# Patient Record
Sex: Female | Born: 1950
Health system: Southern US, Community
[De-identification: ages and names within clinical notes are randomized; demographics above are authoritative.]

## PROBLEM LIST (undated history)

## (undated) DIAGNOSIS — Z87898 Personal history of other specified conditions: Secondary | ICD-10-CM

## (undated) DIAGNOSIS — K449 Diaphragmatic hernia without obstruction or gangrene: Secondary | ICD-10-CM

## (undated) DIAGNOSIS — K219 Gastro-esophageal reflux disease without esophagitis: Secondary | ICD-10-CM

## (undated) DIAGNOSIS — D473 Essential (hemorrhagic) thrombocythemia: Secondary | ICD-10-CM

## (undated) DIAGNOSIS — E119 Type 2 diabetes mellitus without complications: Secondary | ICD-10-CM

## (undated) DIAGNOSIS — D649 Anemia, unspecified: Secondary | ICD-10-CM

## (undated) DIAGNOSIS — E611 Iron deficiency: Secondary | ICD-10-CM

## (undated) DIAGNOSIS — M1990 Unspecified osteoarthritis, unspecified site: Secondary | ICD-10-CM

## (undated) DIAGNOSIS — I471 Supraventricular tachycardia, unspecified: Secondary | ICD-10-CM

## (undated) DIAGNOSIS — E785 Hyperlipidemia, unspecified: Secondary | ICD-10-CM

## (undated) HISTORY — DX: Supraventricular tachycardia, unspecified: I47.10

## (undated) HISTORY — DX: Type 2 diabetes mellitus without complications: E11.9

## (undated) HISTORY — DX: Personal history of other specified conditions: Z87.898

## (undated) HISTORY — PX: COLONOSCOPY: SHX174

## (undated) HISTORY — DX: Diaphragmatic hernia without obstruction or gangrene: K44.9

## (undated) HISTORY — DX: Hyperlipidemia, unspecified: E78.5

## (undated) HISTORY — DX: Essential (hemorrhagic) thrombocythemia: D47.3

## (undated) HISTORY — DX: Unspecified osteoarthritis, unspecified site: M19.90

## (undated) HISTORY — DX: Anemia, unspecified: D64.9

## (undated) HISTORY — DX: Supraventricular tachycardia: I47.1

## (undated) HISTORY — DX: Gastro-esophageal reflux disease without esophagitis: K21.9

## (undated) HISTORY — DX: Iron deficiency: E61.1

---

## 1997-12-08 ENCOUNTER — Other Ambulatory Visit: Admission: RE | Admit: 1997-12-08 | Discharge: 1997-12-08 | Payer: Self-pay | Admitting: Gynecology

## 1998-03-04 ENCOUNTER — Emergency Department (HOSPITAL_COMMUNITY): Admission: EM | Admit: 1998-03-04 | Discharge: 1998-03-04 | Payer: Self-pay | Admitting: Emergency Medicine

## 1998-05-15 HISTORY — PX: OTHER SURGICAL HISTORY: SHX169

## 1999-01-11 ENCOUNTER — Other Ambulatory Visit: Admission: RE | Admit: 1999-01-11 | Discharge: 1999-01-11 | Payer: Self-pay | Admitting: Gynecology

## 2000-01-29 ENCOUNTER — Emergency Department (HOSPITAL_COMMUNITY): Admission: EM | Admit: 2000-01-29 | Discharge: 2000-01-29 | Payer: Self-pay | Admitting: Emergency Medicine

## 2000-06-20 ENCOUNTER — Ambulatory Visit (HOSPITAL_COMMUNITY): Admission: RE | Admit: 2000-06-20 | Discharge: 2000-06-21 | Payer: Self-pay | Admitting: Internal Medicine

## 2000-07-05 ENCOUNTER — Other Ambulatory Visit: Admission: RE | Admit: 2000-07-05 | Discharge: 2000-07-05 | Payer: Self-pay | Admitting: Gynecology

## 2001-08-26 ENCOUNTER — Other Ambulatory Visit: Admission: RE | Admit: 2001-08-26 | Discharge: 2001-08-26 | Payer: Self-pay | Admitting: Gynecology

## 2002-08-25 ENCOUNTER — Other Ambulatory Visit: Admission: RE | Admit: 2002-08-25 | Discharge: 2002-08-25 | Payer: Self-pay | Admitting: Gynecology

## 2003-09-07 ENCOUNTER — Other Ambulatory Visit: Admission: RE | Admit: 2003-09-07 | Discharge: 2003-09-07 | Payer: Self-pay | Admitting: Gynecology

## 2004-02-08 ENCOUNTER — Ambulatory Visit (HOSPITAL_COMMUNITY): Admission: RE | Admit: 2004-02-08 | Discharge: 2004-02-08 | Payer: Self-pay | Admitting: Gastroenterology

## 2004-05-15 LAB — HM COLONOSCOPY: HM Colonoscopy: NORMAL

## 2004-09-08 ENCOUNTER — Other Ambulatory Visit: Admission: RE | Admit: 2004-09-08 | Discharge: 2004-09-08 | Payer: Self-pay | Admitting: Gynecology

## 2009-10-04 LAB — HM MAMMOGRAPHY

## 2009-10-04 LAB — CONVERTED CEMR LAB: Pap Smear: NEGATIVE

## 2009-10-29 ENCOUNTER — Ambulatory Visit: Payer: Self-pay | Admitting: Internal Medicine

## 2009-10-29 DIAGNOSIS — J309 Allergic rhinitis, unspecified: Secondary | ICD-10-CM | POA: Insufficient documentation

## 2009-10-29 DIAGNOSIS — I471 Supraventricular tachycardia: Secondary | ICD-10-CM

## 2009-10-29 DIAGNOSIS — K219 Gastro-esophageal reflux disease without esophagitis: Secondary | ICD-10-CM

## 2009-10-29 DIAGNOSIS — E785 Hyperlipidemia, unspecified: Secondary | ICD-10-CM

## 2009-11-01 LAB — CONVERTED CEMR LAB
BUN: 9 mg/dL (ref 6–23)
Basophils Relative: 0.4 % (ref 0.0–3.0)
Bilirubin, Direct: 0.1 mg/dL (ref 0.0–0.3)
Chloride: 102 meq/L (ref 96–112)
Cholesterol: 155 mg/dL (ref 0–200)
Eosinophils Relative: 0.4 % (ref 0.0–5.0)
GFR calc non Af Amer: 112.68 mL/min (ref >60)
HCT: 34.1 % — ABNORMAL LOW (ref 36.0–46.0)
HDL: 61.9 mg/dL (ref >39.00)
Hemoglobin: 11.8 g/dL — ABNORMAL LOW (ref 12.0–15.0)
LDL Cholesterol: 81 mg/dL (ref 0–99)
Lymphocytes Relative: 25.2 % (ref 12.0–46.0)
Lymphs Abs: 1.8 10*3/uL (ref 0.7–4.0)
Microalb, Ur: 0.5 mg/dL (ref 0.0–1.9)
Monocytes Relative: 5.2 % (ref 3.0–12.0)
Neutro Abs: 5 10*3/uL (ref 1.4–7.7)
Phosphorus: 4.6 mg/dL (ref 2.3–4.6)
Potassium: 4.8 meq/L (ref 3.5–5.1)
RBC: 3.67 M/uL — ABNORMAL LOW (ref 3.87–5.11)
RDW: 13.8 % (ref 11.5–14.6)
Sodium: 140 meq/L (ref 135–145)
Total Protein: 7.6 g/dL (ref 6.0–8.3)
VLDL: 12.2 mg/dL (ref 0.0–40.0)
WBC: 7.2 10*3/uL (ref 4.5–10.5)

## 2010-02-01 ENCOUNTER — Ambulatory Visit: Payer: Self-pay | Admitting: Internal Medicine

## 2010-06-13 ENCOUNTER — Encounter: Payer: Self-pay | Admitting: Internal Medicine

## 2010-06-15 ENCOUNTER — Encounter: Payer: Self-pay | Admitting: Internal Medicine

## 2010-06-16 NOTE — Assessment & Plan Note (Signed)
Summary: FOLLOW UP / LFW   Vital Signs:  Patient profile:   60 year old female Weight:      176 pounds Temp:     98.5 degrees F oral Pulse rate:   80 / minute Pulse rhythm:   regular BP sitting:   120 / 68  (left arm) Cuff size:   regular  Vitals Entered By: Mervin Hack CMA Duncan Dull) (February 01, 2010 9:14 AM) CC: 3 month follow-up   History of Present Illness: Doing okay  still takes the prilosec uses one a day---still occ gets breakthrough Doesn't recall any breakthrough on nexium may need two times a day   Still not checking sugars much Plans to restart No hypoglycemic reactions No problems with the actos  No palpitations or rapid heart rate  No problems with chol meds no myalgias recently  Allergies: No Known Drug Allergies  Past History:  Past medical, surgical, family and social histories (including risk factors) reviewed for relevance to current acute and chronic problems.  Past Medical History: Reviewed history from 10/29/2009 and no changes required. Allergic rhinitis Diabetes mellitus, type II GERD Hyperlipidemia SVT  GYN--Dr Lodema Hong  Past Surgical History: Reviewed history from 10/29/2009 and no changes required. SVT ablation    ~2000     Dr Graciela Husbands  Family History: Reviewed history from 10/29/2009 and no changes required. Dad died of MI @61  Mom fairly healthy. Has macular degeneration 2 sisters--- 1 had uterine cancer, other with DM 1 brother Other distant CAD on dad's side No colon or breast cancer  Social History: Reviewed history from 10/29/2009 and no changes required. 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   Neck:  supple, no masses, no thyromegaly, no carotid bruits, and no cervical lymphadenopathy.   Lungs:  normal respiratory effort, no intercostal retractions, no accessory muscle use, and normal breath sounds.   Heart:  normal rate, regular rhythm, no murmur, and no gallop.   Pulses:  2+ in feet Extremities:  no edema Skin:  no suspicious lesions and no ulcerations.   Psych:  normally interactive, good eye contact, not depressed appearing, and slightly anxious.    Diabetes Management Exam:    Foot Exam (with socks and/or shoes not present):       Sensory-Pinprick/Light touch:          Left medial foot (L-4): normal          Left dorsal foot (L-5): normal          Left lateral foot (S-1): normal          Right medial foot (L-4): normal          Right dorsal foot (L-5): normal          Right lateral foot (S-1): normal       Inspection:          Left foot: normal          Right foot: normal       Nails:          Left foot: normal          Right foot: normal   Impression & Recommendations:  Problem #  1:  DIABETES MELLITUS, TYPE II (ICD-250.00) Assessment Unchanged control is okay will continue current meds for now  Her updated medication list for this problem includes:    Metformin Hcl 1000 Mg Tabs (Metformin hcl) .Marland Kitchen... Take 1 by mouth two times a day    Actos 45 Mg Tabs (Pioglitazone hcl) .Marland Kitchen... Take 1 by mouth once daily    Aspirin 81 Mg Tabs (Aspirin) ..... Once daily  Labs Reviewed: Creat: 0.6 (10/29/2009)     Last Eye Exam: normal (03/15/2009) Reviewed HgBA1c results: 7.3 (10/29/2009)  Problem # 2:  HYPERLIPIDEMIA (ICD-272.4) Assessment: Unchanged at goal discussed primary prevention but decided to continue  Her updated medication list for this problem includes:    Simvastatin 10 Mg Tabs (Simvastatin) .Marland Kitchen... Take 1 by mouth once daily  Labs Reviewed: SGOT: 22 (10/29/2009)   SGPT: 18 (10/29/2009)   HDL:61.90 (10/29/2009)  LDL:81 (10/29/2009)  Chol:155  (10/29/2009)  Trig:61.0 (10/29/2009)  Problem # 3:  GERD (ICD-530.81) Assessment: Comment Only may need two times a day prilosec  The following medications were removed from the medication list:    Nexium 40 Mg Cpdr (Esomeprazole magnesium) .Marland Kitchen... Take 1 by mouth once daily Her updated medication list for this problem includes:    Prilosec Otc 20 Mg Tbec (Omeprazole magnesium) .Marland Kitchen... Take 1-2 by mouth once daily  Problem # 4:  SUPRAVENTRICULAR TACHYCARDIA (ICD-427.89) Assessment: Unchanged no recurrence  Her updated medication list for this problem includes:    Aspirin 81 Mg Tabs (Aspirin) ..... Once daily  Complete Medication List: 1)  Metformin Hcl 1000 Mg Tabs (Metformin hcl) .... Take 1 by mouth two times a day 2)  Actos 45 Mg Tabs (Pioglitazone hcl) .... Take 1 by mouth once daily 3)  Simvastatin 10 Mg Tabs (Simvastatin) .... Take 1 by mouth once daily 4)  Prilosec Otc 20 Mg Tbec (Omeprazole magnesium) .... Take 1-2 by mouth once daily 5)  Womens Multivitamin Plus Tabs (Multiple vitamins-minerals) .... Once daily 6)  Vitamin C 500 Mg Tabs (Ascorbic acid) .... Once daily 7)  Fish Oil 1000 Mg Caps (Omega-3 fatty acids) .... Once daily 8)  Calcium-vitamin D 250-125 Mg-unit Tabs (Calcium carbonate-vitamin d) .... Once daily 9)  Aspirin 81 Mg Tabs (Aspirin) .... Once daily  Other Orders: Tdap => 68yrs IM [phone]) Admin 1st Vaccine (60454) Admin 1st Vaccine [facility]) [phone]) Flu Vaccine 20yrs + [phone]) Admin of Any Addtl Vaccine (95621) Admin of Any Addtl Vaccine (State) (30865H) Pneumococcal Vaccine (84696) Admin of Any Addtl Vaccine (29528) Admin of Any Addtl Vaccine (State) [phone])  Patient Instructions: 1)  Please schedule a follow-up appointment in 6 months for physical Prescriptions: PRILOSEC OTC 20 MG TBEC (OMEPRAZOLE MAGNESIUM) take 1-2 by mouth once daily  #60 x 12   Entered by:   Mervin Hack CMA (AAMA)   Authorized by:   Cindee Salt MD   Signed by:    Mervin Hack CMA (AAMA) on 02/01/2010   Method used:   Print then Give to Patient   RxID:   [id number]   Current Allergies (reviewed today): No known allergies    Tetanus/Td Vaccine    Vaccine Type: Tdap    Site: left deltoid    Mfr: GlaxoSmithKline    Dose: 0.5 ml    Route: IM    Given by: Mervin Hack CMA (AAMA)    Exp. Date: 02/03/2012    Lot #: [id number]    VIS given: 04/01/08 version given February 01, 2010.  Pneumovax Vaccine  Vaccine Type: Pneumovax    Site: right deltoid    Mfr: Merck    Dose: 0.5 ml    Route: IM    Given by: Mervin Hack CMA (AAMA)    Exp. Date: 07/28/2011    Lot #: [id number]    VIS given: 04/19/09 version given February 01, 2010.  Influenza Vaccine    Vaccine Type: Fluvax 3+    Site: left deltoid    Mfr: GlaxoSmithKline    Dose: 0.5 ml    Route: IM    Given by: Mervin Hack CMA (AAMA)    Exp. Date: 11/12/2010    Lot #: [id number]    VIS given: 12/07/09 version given February 01, 2010.  Flu Vaccine Consent Questions    Do you have a history of severe allergic reactions to this vaccine? no    Any prior history of allergic reactions to egg and/or gelatin? no    Do you have a sensitivity to the preservative Thimersol? no    Do you have a past history of Guillan-Barre Syndrome? no    Do you currently have an acute febrile illness? no    Have you ever had a severe reaction to latex? no    Vaccine information given and explained to patient? yes    Are you currently pregnant? no

## 2010-06-16 NOTE — Assessment & Plan Note (Signed)
Summary: NEW PT TO EST/CLE   Vital Signs:  Patient profile:   60 year old female Height:      64 inches Weight:      171 pounds BMI:     29.46 Temp:     98.7 degrees F oral Pulse rate:   88 / minute Pulse rhythm:   regular BP sitting:   148 / 80  (left arm) Cuff size:   regular  Vitals Entered By: Mervin Hack CMA Duncan Dull) (October 29, 2009 11:38 AM) CC: new patient to establish care   History of Present Illness: Here to establish I recently started seeing her husband  GERD for some time on prilosec in past Believes she failed zantac in the past then switched to nexium and has done well on that  Diabetes for about 10 years doesn't really check at home metformin--then actos added never on other meds no low sugar reactions  Put on statin chol not that high but due to DM  Developed palpitations had shots in ER then on atenolol apparent SVT had ablation  ~2000 by Dr Harless Nakayama cats if heavy indoor exposure as well  Preventive Screening-Counseling & Management  Alcohol-Tobacco     Smoking Status: never  Allergies (verified): No Known Drug Allergies  Past History:  Past Medical History: Allergic rhinitis Diabetes mellitus, type II GERD Hyperlipidemia SVT  GYN--Dr Lodema Hong  Past Surgical History: SVT ablation    ~2000     Dr Graciela Husbands  Family History: Dad died of MI @61  Mom fairly healthy. 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 Eyes:  Denies  double vision and vision loss-1 eye. CV:  Denies chest pain or discomfort, difficulty breathing at night, difficulty breathing while lying down, fainting, lightheadness, palpitations, and shortness of breath with exertion. Resp:  Denies cough and shortness of breath. GI:  Complains of indigestion; denies abdominal pain, bloody stools, change in bowel habits, and dark tarry stools; heartburn controlled occ bloating. GU:  Denies dysuria and incontinence; No sexual problems. MS:  Denies joint pain and joint swelling; right 5th finger ---distal digit in flexion from apparent tendon injury. Derm:  Denies lesion(s) and rash. Neuro:  Denies numbness, tingling, and weakness. Psych:  Denies anxiety and depression; work stress. Allergy:  Complains of seasonal allergies and sneezing; uses OTC meds as needed .  Physical Exam  General:  alert and normal appearance.   Neck:  supple, no masses, no thyromegaly, no carotid bruits, and no cervical lymphadenopathy.   Lungs:  normal respiratory effort and normal breath sounds.   Heart:  normal rate, regular rhythm, no murmur, and no gallop.   Abdomen:  soft and non-tender.   Msk:  no joint tenderness and no joint swelling.   Pulses:  1+ in feet Extremities:  no edema Neurologic:  alert & oriented X3, strength normal in all extremities, and gait normal.   Skin:  no suspicious lesions and no ulcerations.   Psych:  normally interactive, good eye contact, not anxious appearing, and not depressed appearing.    Diabetes  Management Exam:    Foot Exam (with socks and/or shoes not present):       Sensory-Pinprick/Light touch:          Left medial foot (L-4): normal          Left dorsal foot (L-5): normal          Left lateral foot (S-1): normal          Right medial foot (L-4): normal          Right dorsal foot (L-5): normal          Right lateral foot (S-1): normal       Sensory-Monofilament:          Left foot: normal          Right foot: normal        Nails:          Left foot: normal          Right foot: normal    Eye Exam:       Eye Exam done elsewhere          Date: 03/15/2009          Results: normal          Done by: Dr Alexia Freestone   Impression & Recommendations:  Problem # 1:  DIABETES MELLITUS, TYPE II (ICD-250.00) Assessment Unchanged  seems to have good control urged her to get machine to check sugars at least once a week consider changing actos to glipizide  Her updated medication list for this problem includes:    Metformin Hcl 1000 Mg Tabs (Metformin hcl) .Marland Kitchen... Take 1 by mouth two times a day    Actos 45 Mg Tabs (Pioglitazone hcl) .Marland Kitchen... Take 1 by mouth once daily    Aspirin 81 Mg Tabs (Aspirin) ..... Once daily  Last Eye Exam: normal (03/15/2009)  Orders: TLB-Microalbumin/Creat Ratio, Urine (82043-MALB) TLB-A1C / Hgb A1C (Glycohemoglobin) (83036-A1C) TLB-Renal Function Panel (80069-RENAL) TLB-CBC Platelet - w/Differential (85025-CBCD) TLB-TSH (Thyroid Stimulating Hormone) (84443-TSH)  Problem # 2:  GERD (ICD-530.81) Assessment: Comment Only doing well she will try omeprazole  Her updated medication list for this problem includes:    Nexium 40 Mg Cpdr (Esomeprazole magnesium) .Marland Kitchen... Take 1 by mouth once daily  Problem # 3:  HYPERLIPIDEMIA (ICD-272.4) Assessment: Comment Only  not clear cut if LDL <<100, consider stopping  Her updated medication list for this problem includes:    Simvastatin 10 Mg Tabs (Simvastatin) .Marland Kitchen... Take 1 by mouth once daily  Orders: TLB-Lipid Panel (80061-LIPID) TLB-Hepatic/Liver Function Pnl (80076-HEPATIC) Venipuncture (04540)  Problem # 4:  ALLERGIC RHINITIS (ICD-477.9) Assessment: Unchanged okay with OTC meds  Complete Medication List: 1)  Metformin Hcl 1000 Mg Tabs (Metformin hcl) .... Take 1 by mouth two times a day 2)  Actos 45 Mg Tabs (Pioglitazone hcl) .... Take 1 by mouth once daily 3)  Simvastatin 10 Mg Tabs (Simvastatin) .... Take 1 by mouth once daily 4)   Nexium 40 Mg Cpdr (Esomeprazole magnesium) .... Take 1 by mouth once daily 5)  Womens Multivitamin Plus Tabs (Multiple vitamins-minerals) .... Once daily 6)  Vitamin C 500 Mg Tabs (Ascorbic acid) .... Once daily 7)  Fish Oil 1000 Mg Caps (Omega-3 fatty acids) .... Once daily 8)  Calcium-vitamin D 250-125 Mg-unit Tabs (Calcium carbonate-vitamin d) .... Once daily 9)  Aspirin 81 Mg Tabs (Aspirin) .... Once daily  Patient Instructions: 1)  Please try omeprazole 20mg  daily for the reflux. (over the counter). 2)  I  can prescribe the nexium if that doesn't work 3)  Please get last 3 years of records from Dr Earl Gala 4)  Please schedule a follow-up appointment in 3 months .   Current Allergies (reviewed today): No known allergies    Colonoscopy  Procedure date:  05/15/2004  Findings:       Results: Normal.   Comments:      Repeat colonoscopy in 10 years.    Mammogram  Procedure date:  10/04/2009  Findings:      No specific mammographic evidence of malignancy.    Pap Smear  Procedure date:  10/04/2009  Findings:      Interpretation/Result:Negative for intraepithelial Lesion or Malignancy.

## 2010-06-21 ENCOUNTER — Telehealth: Payer: Self-pay | Admitting: Internal Medicine

## 2010-06-30 NOTE — Letter (Signed)
Summary: [facility]   Imported By: Kassie Mends 06/22/2010 08:31:02  _____________________________________________________________________  External Attachment:    Type:   Image     Comment:   External Document  Appended Document: [facility]    Clinical Lists Changes  Observations: Added new observation of DIAB EYE EX: No diabetic retinopathy.    (06/14/2010 14:26)       Diabetic Eye Exam  Procedure date:  06/14/2010  Findings:      No diabetic retinopathy.

## 2010-06-30 NOTE — Progress Notes (Signed)
Summary: refill request for metformin  Phone Note Refill Request Message from:  Patient  Refills Requested: Medication #1:  METFORMIN HCL 1000 MG TABS take 1 by mouth two times a day Pt is requesting a 90 day written script to send to mail order pharmacy.  She is asking that this be mailed to her home address.  Initial call taken by: Lowella Petties CMA, AAMA,  June 21, 2010 9:19 AM  Follow-up for Phone Call        Rx written Make sure she knows this is $10 locally without insurance Follow-up by: Cindee Salt MD,  June 21, 2010 12:21 PM  Additional Follow-up for Phone Call Additional follow up Details #1::        left message on machine that rx was mailed to home address. Additional Follow-up by: Mervin Hack CMA Duncan Dull),  June 21, 2010 12:58 PM    Prescriptions: METFORMIN HCL 1000 MG TABS (METFORMIN HCL) take 1 by mouth two times a day  #180 x 3   Entered and Authorized by:   Cindee Salt MD   Signed by:   Cindee Salt MD on 06/21/2010   Method used:   Print then Give to Patient   RxID:   [id number]

## 2010-08-01 ENCOUNTER — Encounter: Payer: Self-pay | Admitting: Internal Medicine

## 2010-08-01 ENCOUNTER — Encounter (INDEPENDENT_AMBULATORY_CARE_PROVIDER_SITE_OTHER): Payer: Managed Care, Other (non HMO) | Admitting: Internal Medicine

## 2010-08-01 ENCOUNTER — Other Ambulatory Visit: Payer: Self-pay | Admitting: Internal Medicine

## 2010-08-01 DIAGNOSIS — E785 Hyperlipidemia, unspecified: Secondary | ICD-10-CM

## 2010-08-01 DIAGNOSIS — Z0000 Encounter for general adult medical examination without abnormal findings: Secondary | ICD-10-CM

## 2010-08-01 DIAGNOSIS — E119 Type 2 diabetes mellitus without complications: Secondary | ICD-10-CM

## 2010-08-01 LAB — CBC WITH DIFFERENTIAL/PLATELET
Basophils Absolute: 0 10*3/uL (ref 0.0–0.1)
Basophils Relative: 0.6 % (ref 0.0–3.0)
Eosinophils Absolute: 0.1 10*3/uL (ref 0.0–0.7)
HCT: 34.3 % — ABNORMAL LOW (ref 36.0–46.0)
Hemoglobin: 11.5 g/dL — ABNORMAL LOW (ref 12.0–15.0)
Lymphs Abs: 1.9 10*3/uL (ref 0.7–4.0)
MCHC: 33.6 g/dL (ref 30.0–36.0)
Monocytes Relative: 6.9 % (ref 3.0–12.0)
Neutro Abs: 3.4 10*3/uL (ref 1.4–7.7)
RBC: 3.66 Mil/uL — ABNORMAL LOW (ref 3.87–5.11)
RDW: 14.4 % (ref 11.5–14.6)

## 2010-08-01 LAB — RENAL FUNCTION PANEL
Albumin: 4.3 g/dL (ref 3.5–5.2)
BUN: 10 mg/dL (ref 6–23)
CO2: 29 mEq/L (ref 19–32)
Chloride: 101 mEq/L (ref 96–112)
Creatinine, Ser: 0.6 mg/dL (ref 0.4–1.2)
GFR: 100.32 mL/min (ref >60.00)

## 2010-08-01 LAB — LIPID PANEL
HDL: 67 mg/dL (ref >39.00)
Total CHOL/HDL Ratio: 2
VLDL: 7.8 mg/dL (ref 0.0–40.0)

## 2010-08-01 LAB — HEPATIC FUNCTION PANEL
ALT: 17 U/L (ref 0–35)
Bilirubin, Direct: 0.1 mg/dL (ref 0.0–0.3)
Total Bilirubin: 0.3 mg/dL (ref 0.3–1.2)

## 2010-08-11 NOTE — Assessment & Plan Note (Signed)
Summary: CPX/DLO   Vital Signs:  Patient profile:   60 year old female Weight:      175 pounds BMI:     30.15 Temp:     98.4 degrees F oral Pulse rate:   80 / minute Pulse rhythm:   regular BP sitting:   128 / 70  (left arm) Cuff size:   regular  Vitals Entered By: Mervin Hack CMA Duncan Dull) (August 01, 2010 11:53 AM) CC: adult physical   History of Present Illness: Feeling well hasn't lost weight--up and down  Has been doing okay on omeprazole generic Occ uses second if eating late, etc This is doing well  keeps up with gyn  Allergies: No Known Drug Allergies  Past History:  Past medical, surgical, family and social histories (including risk factors) reviewed for relevance to current acute and chronic problems.  Past Medical History: Reviewed history from 10/29/2009 and no changes required. Allergic rhinitis Diabetes mellitus, type II GERD Hyperlipidemia SVT  GYN--Dr Lodema Hong  Past Surgical History: Reviewed history from 10/29/2009 and no changes required. SVT ablation    ~2000     Dr Graciela Husbands  Family History: Reviewed history from 10/29/2009 and no changes required. Dad died of MI @61  Mom fairly healthy. Has macular degeneration 2 sisters--- 1 had uterine cancer, other with DM 1 brother Other distant CAD on dad's side No colon or breast cancer  Social History: Reviewed history from 10/29/2009 and no changes required. 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 Eyes:  Denies double vision and vision loss-1 eye. ENT:  Denies decreased hearing and ringing in ears; teeth fine---regular with dentist. CV:  Denies chest pain or discomfort, difficulty breathing at night, difficulty breathing while lying down, fainting, lightheadness,  palpitations, and shortness of breath with exertion. Resp:  Denies cough and shortness of breath; occ AM cough due to drainage from allergies. GI:  Complains of indigestion; denies abdominal pain, bloody stools, change in bowel habits, dark tarry stools, nausea, and vomiting; heartburn mostly controlled. GU:  Denies dysuria and incontinence; no sexual problems. MS:  Denies joint pain and joint swelling. Derm:  Denies lesion(s) and rash. Neuro:  Complains of numbness; denies headaches, tingling, and weakness; occ awakens with numb hands---resolves quickly. Psych:  Denies anxiety and depression. Heme:  Denies abnormal bruising and enlarge lymph nodes. Allergy:  Complains of seasonal allergies and sneezing; uses OTC meds.  Physical Exam  General:  alert and normal appearance.   Eyes:  pupils equal, pupils round, pupils reactive to light, and no optic disk abnormalities.   Ears:  R ear normal and L ear normal.   Mouth:  no erythema, no exudates, and no lesions.   Neck:  supple, no masses, no thyromegaly, no carotid bruits, and no cervical lymphadenopathy.   Lungs:  normal respiratory effort, no intercostal retractions, no accessory muscle use, and normal breath sounds.   Heart:  normal rate, regular rhythm, no murmur, and no gallop.   Abdomen:  soft and non-tender.   Msk:  no joint tenderness and no joint swelling.   Pulses:  1+ in feet Extremities:  no edema Neurologic:  alert & oriented X3, strength normal in all extremities, and gait normal.   Skin:  no rashes, no suspicious lesions, and no ulcerations.   Axillary Nodes:  No palpable lymphadenopathy Psych:  normally interactive, good eye contact, not anxious appearing, and not depressed appearing.    Diabetes Management Exam:    Foot Exam (with socks and/or shoes not present):       Sensory-Pinprick/Light touch:          Left medial foot (L-4): normal          Left dorsal foot (L-5): normal          Left lateral foot (S-1): normal           Right medial foot (L-4): normal          Right dorsal foot (L-5): normal          Right lateral foot (S-1): normal       Inspection:          Left foot: normal          Right foot: normal       Nails:          Left foot: normal          Right foot: normal   Impression & Recommendations:  Problem # 1:  PREVENTIVE HEALTH CARE (ICD-V70.0) Assessment Comment Only up to date on colonoscopy still sees Gyn yearly  Problem # 2:  DIABETES MELLITUS, TYPE II (ICD-250.00) Assessment: Unchanged  really wants to get off actos and I endorse this if still under 7.5%, will stop Consider glipizide if needs another med---if >7.5%, will just substitute it  The following medications were removed from the medication list:    Actos 45 Mg Tabs (Pioglitazone hcl) .Marland Kitchen... Take 1 by mouth once daily Her updated medication list for this problem includes:    Metformin Hcl 1000 Mg Tabs (Metformin hcl) .Marland Kitchen... Take 1 by mouth two times a day    Aspirin 81 Mg Tabs (Aspirin) ..... Once daily  Labs Reviewed: Creat: 0.6 (10/29/2009)     Last Eye Exam: No diabetic retinopathy.    (06/14/2010) Reviewed HgBA1c results: 7.3 (10/29/2009)  Orders: TLB-A1C / Hgb A1C (Glycohemoglobin) (83036-A1C) TLB-TSH (Thyroid Stimulating Hormone) (84443-TSH) TLB-CBC Platelet - w/Differential (85025-CBCD) TLB-Renal Function Panel (80069-RENAL)  Problem # 3:  GERD (ICD-530.81) Assessment: Unchanged okay on the med  Her updated medication list for this problem includes:    Prilosec Otc 20 Mg Tbec (Omeprazole magnesium) .Marland Kitchen... Take 1-2 by mouth once daily  Problem # 4:  HYPERLIPIDEMIA (ICD-272.4) Assessment: Unchanged  will recheck labs  no problems with the med  Her updated medication list for this problem includes:    Simvastatin 10 Mg Tabs (Simvastatin) .Marland Kitchen... Take 1 by mouth once daily  Labs Reviewed: SGOT: 22 (10/29/2009)   SGPT: 18 (10/29/2009)   HDL:61.90 (10/29/2009)  LDL:81 (10/29/2009)  Chol:155  (10/29/2009)  Trig:61.0 (10/29/2009)  Orders: TLB-Lipid Panel (80061-LIPID) TLB-Hepatic/Liver Function Pnl (80076-HEPATIC) Venipuncture (29562)  Complete Medication List: 1)  Metformin Hcl 1000 Mg Tabs (Metformin hcl) .... Take 1 by mouth two times a day 2)  Simvastatin 10 Mg Tabs (Simvastatin) .... Take 1 by mouth once daily 3)  Prilosec Otc 20 Mg Tbec (Omeprazole magnesium) .... Take 1-2 by mouth once daily 4)  Womens Multivitamin Plus Tabs (Multiple vitamins-minerals) .... Once daily 5)  Vitamin C 500 Mg Tabs (Ascorbic acid) .... Once daily 6)  Fish Oil 1000 Mg Caps (Omega-3 fatty acids) .... Once daily 7)  Calcium-vitamin D 250-125 Mg-unit Tabs (Calcium carbonate-vitamin d) .... Once daily 8)  Aspirin 81 Mg Tabs (Aspirin) .... Once daily  Patient Instructions: 1)  Please stop the actos 2)  We will call with the lab results to let you know if you have to start another med 3)  Please schedule a follow-up appointment in 6 months .  Prescriptions: SIMVASTATIN 10 MG TABS (SIMVASTATIN) take 1 by mouth once daily  #90 x 3   Entered and Authorized by:   Cindee Salt MD   Signed by:   Cindee Salt MD on 08/01/2010   Method used:   Print then Give to Patient   RxID:   [id number]    Orders Added: 1)  TLB-A1C / Hgb A1C (Glycohemoglobin) [83036-A1C] 2)  TLB-TSH (Thyroid Stimulating Hormone) [84443-TSH] 3)  TLB-CBC Platelet - w/Differential [85025-CBCD] 4)  TLB-Renal Function Panel [80069-RENAL] 5)  TLB-Lipid Panel [80061-LIPID] 6)  TLB-Hepatic/Liver Function Pnl [80076-HEPATIC] 7)  Venipuncture [36415] 8)  Est. Patient 40-64 years [14782]    Current Allergies (reviewed today): No known allergies

## 2010-09-30 ENCOUNTER — Telehealth: Payer: Self-pay | Admitting: *Deleted

## 2010-09-30 MED ORDER — GLIPIZIDE 5 MG PO TABS: 5.0000 mg | ORAL_TABLET | Freq: Every morning | ORAL | Status: DC

## 2010-09-30 NOTE — Telephone Encounter (Signed)
Pt states she was taken off actos about a month ago and now her blood sugars are running around 150. She says she needs something to go along with the metformin that she is on now, and Dr. Alphonsus Sias told her there is another medicine that she can take.  She would like a written 90 day script mailed to her home address.

## 2010-09-30 NOTE — Telephone Encounter (Signed)
Left message on machine at home advising patient as instructed.  Rx mailed to home address.

## 2010-09-30 NOTE — Discharge Summary (Signed)
[place]  Patient:    Savannah Evans, Savannah Evans                     MRN: [id number] Adm. Date:  [id number] Disc. Date: [id number] Attending:  Nathen May Dictator:   Chinita Pester, CRNP CC:         Winn Jock. Earl Gala, M.D., [address], [place], [place] [address]             Nathen May, M.D., [facility]   Discharge Summary  PRIMARY DIAGNOSIS:  AV nodal reentrant tachycardia.  SECONDARY DIAGNOSIS:  Increased recently new onset diabetes type 2.  HISTORY OF PRESENT ILLNESS:  This is a 60 year old woman with a 25-year history of recent abrupt onset of tachy palpitations initially presented during pregnancy of her first child. Thereafter, she had episodes every three years for 15 years. Over the last four to five years, however, she has increase in frequency and now had three episodes in the last few months, a number of which required hospitalization and medication to convert her rhythm. Initially treated with atenolol. She continued to have spells on 25 mg. She has not had any spells since she increased the dose to 50 mg a day.  Symptoms are notable for significant presyncope, particularly worse at the beginning. There is also chest pain and shortness of breath that are frog and diuretic negative.  She has been unable to terminate these episodes with Valsalva maneuver.  The patient was admitted for an AV nodal ablation. She underwent inducible AV nodal reentrant tachycardia and had successful slow pathway modification eliminating antegrade slow pathway function and substrate for AV nodal reentrant tachycardia.  The patient tolerated the procedure well. She had no complications post procedure and was discharged to home the following day.  She was discharged on the following medications.  DISCHARGE MEDICATIONS: 1. Enteric coated aspirin 325 mg daily for six weeks. 2. Antibiotics prior to any dental or gynecologic work for the next  three    months. 3. Calcium as before. 4. Multivitamins as before. 5. Atenolol 25 mg continued for five days and then discontinue.  ACTIVITY:  No heavy lifting or strenuous activity for four days.  No driving for two days.  DIET:  Low fat, low cholesterol, no concentrated sweet diet.  WOUND CARE:  She was instructed to keep her wounds clean and dry.  She may shower.  She was to call if there is any drainage, lump, or if she developed a fever.  FOLLOW-UP:  She is to follow up with Nathen May, M.D., [facility], on Monday, July 09, 2000, at 3:45 p.m. DD:  06/21/00 TD:  06/22/00 Job: [id number]

## 2010-09-30 NOTE — Op Note (Signed)
NAME:  Savannah Evans, CHALUPA NO.:  [id number]   MEDICAL RECORD NO.:  [id number]          PATIENT TYPE:  AMB   LOCATION:  ENDO                         FACILITY:  [facility]   PHYSICIAN:  Danise Edge, M.D.   DATE OF BIRTH:  08/09/1950   DATE OF PROCEDURE:  02/08/2004  DATE OF DISCHARGE:                                 OPERATIVE REPORT   PROCEDURE:  Screening colonoscopy.   INDICATIONS FOR PROCEDURE:  Ms. Blanchie Zeleznik is a 60 year old female born  December 14, 1950.  Ms. Totino is scheduled to undergo her first screening  colonoscopy with polypectomy to prevent colon cancer.  She denies a personal  or family history of colon cancer.   ENDOSCOPIST:  Danise Edge, M.D.   PREMEDICATION:  Versed 9 mg, Demerol 60 mg.   PROCEDURE:  After obtaining informed consent, Ms. Vanzandt was placed in the  left lateral decubitus position.  I administered intravenous Demerol and  intravenous Versed to achieve conscious sedation for the procedure.  The  patient's blood pressure, oxygen saturation, and cardiac rhythm were  monitored throughout the procedure and documented in the medical record.   Anal inspection and digital rectal exam was normal.  The Olympus adjustable  pediatric colonoscope was introduced into the rectum and advanced to the  cecum.  A normal-appearing ileocecal valve was intubated, and the distal  ileum inspected.  Colonic preparation for the exam today was excellent.   RECTUM:  Normal.   SIGMOID COLON/DESCENDING COLON:  Normal.   SPLENIC FLEXURE:  Normal.   TRANSVERSE COLON:  Normal.   HEPATIC FLEXURE:  Normal.   ASCENDING COLON:  Normal.   CECUM AND ILEOCECAL VALVE:  Normal.   DISTAL ILEUM:  Normal.   ASSESSMENT:  Normal screening proctocolonoscopy of the cecum.  No endoscopic  evidence for the presence of colorectal neoplasia.      MJ/MEDQ  D:  02/08/2004  T:  02/08/2004  Job:  [id number]   cc:   Theressa Millard, M.D.  [address] [place]  [place] [address]  Fax: [phone]

## 2010-09-30 NOTE — Telephone Encounter (Signed)
Please send Rx Caution her to be careful about possible low sugar reactions

## 2010-09-30 NOTE — Procedures (Signed)
[place]  Patient:    Savannah Evans, Savannah Evans                       MRN: [id number] Proc. Date: 06/20/00 Attending:  Nathen May, M.D., [facility] CC:         Electrophysiology Laboratory  Winn Jock. Earl Gala, M.D.   Procedure Report  PREOPERATIVE DIAGNOSIS:  Supraventricular tachycardia.  POSTOPERATIVE DIAGNOSIS:  AV nodal re-entry tachycardia.  PROCEDURE:  Electrophysiological study with radiofrequency catheter ablation and arrhythmia mapping.  DESCRIPTION OF PROCEDURE:  Following the obtaining of informed consent, the patient was brought to the electrophysiology laboratory and placed on the fluoroscopic table in supine position.  After routine prep and drape, cardiac catheterization was performed with local anesthesia and conscious sedation. Noninvasive blood pressure monitoring and transcutaneous oxygen saturation monitoring were performed continuously throughout the procedure.  Following insertion of the catheters, the stimulation protocol included incremental atrial pacing, incremental ventricular pacing, single atrial extrastimuli at a paced cycle length of 500 msec, double atrial extrastimulation at a cycle length of 400 msec, single ventricular extrastimuli at a paced cycle length of 600 msec.  Surface leads I, aVF, and V1 were monitored continuously throughout the procedure.  At the end of the procedure, the catheter was removed, hemostasis was obtained, and the patient was transferred to the holding area in stable condition.  CATHETERS:  A 5 French quadripolar catheter was inserted via the left femoral vein to the AV junction, and a 5 French quadripolar catheter was inserted via the left femoral junction to the high right atrium.  A 5 French quadripolar catheter was inserted from the left femoral vein to the right ventricular apex.  A 6 French octapolar catheter was inserted via the right femoral vein to the coronary sinus.  A 7  French 4 mm deflectable-tip ablation catheter was inserted via the right femoral vein using a Swartz (SR-0) sheath to mapping sites in the posterior septal space.  RESULTS:  SURFACE ELECTROCARDIOGRAM:            Initial             Final Rhythm:                                sinus              sinus Cycle length:                         596 msec            560 msec PR interval:                           99 msec             88 msec QRS duration:                          65 msec             69 msec QT interval:                          325 msec            323 msec P-wave duration:  68 msec             58 msec Bundle branch block:                   absent              absent Pre-excitation:                        absent              absent  AV NODAL FUNCTION: AH interval:                           52 msec             55 msec VA Wenckebach:  Less than or equal to 300 msec.  AV nodal conduction characteristics could not be fully characterized prior to ablation.  The patient had discontinuity in induction of tachycardia, but the patients tachycardia was very rapid (see below) and was difficult to terminate.  In addition, discontinuity was seen only with double atrial extrastimuli at a paced cycle length of 400:280.  Following the ablation, the patient had no antegrade slow pathway conduction, with the ERP of the fast pathway at a paced cycle length of 400 msec of 270 msec.  Retrograde conduction was continuous pre- and post-ablation.  HIS-PURKINJE SYSTEM FUNCTION:         Initial             Final HV interval:                          34 msec             32 msec His bundle duration:                   8 msec             12 msec  ACCESSORY PATHWAY:  No evidence of accessory pathway was evident.  ARRHYTHMIAS INDUCED:  AV node re-entry tachycardia was reproducibly inducible, initially with atrial pacing of 270 msec as well as with double atrial extrastimuli.  The  tachycardia was terminated with atrial pacing at 220 msec. In a typical episode of tachycardia, the cycle length was 270 msec with an HA interval of 25 msec and an AH interval of 231 msec, a VA time of 7 msec. Earliest atrial activation was in the His bundle electrogram.  Right bundle branch block aberration was unassociated with a change in the [place] time. Initiation of tachycardia was particularly dependent on AH prolongation.  FLUOROSCOPY TIME:  A total of nine minutes of fluoroscopy time was utilized at 15 frames per second.  RADIOFREQUENCY ENERGY:  A total of 34 seconds of energy was applied in two separate applications to sites in the posterior septal space at the level of the coronary sinus, where an AH ratio of 1:4 was identified.  With the second application, a junctional tachycardia ensued and after a total application time of 19 seconds, the application was terminated because of displacement of the catheter.  Hereafter, no evidence of antegrade slow pathway conduction could be identified, and no further RF applications were given.  IMPRESSION: 1. Sinus tachycardia. 2. Normal atrial function. 3. Dual antegrade AV nodal physiology with inducible AV nodal (typical)    slow-fast AV nodal re-entry tachycardia.  Slow pathway modification using  RF energy with _____ eliminated conduction over the slow pathway, albeit    with an abbreviated application of RF energy.  This may have some    implications to the likelihood of recurrence. 4. Normal His-Purkinje function. 5. No accessory pathway. 6. Normal ventricular response to programmed stimulation as described above.  SUMMARY:  In conclusion, the results of electrophysiological testing supported the electrocardiographic suggestion of AV nodal re-entry tachycardia but were discordant with the patients clinical symptoms.  Radiofrequency modification of the slow pathway successfully eliminated the substrate for the  patients tachyarrhythmia.  RECOMMENDATIONS:  1. Observe her overnight. 2. Wean atenolol. 3. Endocarditis prophylaxis for three months. 4. An aspirin daily for six weeks. 5. Anticipate discharge in the a.m., which was, in fact, accomplished today. DD:  06/21/00 TD:  06/22/00 Job: [id number]

## 2010-11-23 ENCOUNTER — Telehealth: Payer: Self-pay | Admitting: *Deleted

## 2010-11-23 NOTE — Telephone Encounter (Signed)
Pt was started on glipizide about a month ago. She takes this in the morning before breakfast.  She checks her blood sugar about once a week and lately it has been over 150.  States that when she first started the glipizide it was helping and her sugars were less than 150.  She is asking if she should start taking a 2nd glipizide during the day.

## 2010-11-23 NOTE — Telephone Encounter (Signed)
Yes, the next step would be to add a second glipizide before supper Have her start this and let us know if the sugars aren't coming down some New Rx for 1 year for bid dose

## 2010-11-23 NOTE — Telephone Encounter (Signed)
Left message with results, asked patient to return my call if she needs a new rx and what pharmacy.

## 2010-11-23 NOTE — Telephone Encounter (Signed)
Per Morrie Sheldon, patient called back and stated she will call back if the med is helping.

## 2010-12-02 ENCOUNTER — Other Ambulatory Visit: Payer: Self-pay | Admitting: *Deleted

## 2010-12-02 MED ORDER — GLIPIZIDE 5 MG PO TABS: 5.0000 mg | ORAL_TABLET | Freq: Two times a day (BID) | ORAL | Status: DC

## 2010-12-02 NOTE — Telephone Encounter (Signed)
Patient is asking for a written script to be mailed to her home.

## 2010-12-02 NOTE — Telephone Encounter (Signed)
Script mailed to patient's home address.

## 2010-12-02 NOTE — Telephone Encounter (Signed)
Patient was told to

## 2011-02-06 ENCOUNTER — Encounter: Payer: Self-pay | Admitting: Internal Medicine

## 2011-02-06 ENCOUNTER — Ambulatory Visit (INDEPENDENT_AMBULATORY_CARE_PROVIDER_SITE_OTHER): Payer: Managed Care, Other (non HMO) | Admitting: Internal Medicine

## 2011-02-06 DIAGNOSIS — I498 Other specified cardiac arrhythmias: Secondary | ICD-10-CM

## 2011-02-06 DIAGNOSIS — E119 Type 2 diabetes mellitus without complications: Secondary | ICD-10-CM

## 2011-02-06 DIAGNOSIS — K219 Gastro-esophageal reflux disease without esophagitis: Secondary | ICD-10-CM

## 2011-02-06 LAB — HEMOGLOBIN A1C: Hgb A1c MFr Bld: 7.7 % — ABNORMAL HIGH (ref 4.6–6.5)

## 2011-02-06 NOTE — Assessment & Plan Note (Signed)
Controlled with the once a day PPI

## 2011-02-06 NOTE — Assessment & Plan Note (Signed)
Hopefully still reasonable control Will check A1c 

## 2011-02-06 NOTE — Assessment & Plan Note (Signed)
No palpitations Normal exam No changes

## 2011-02-06 NOTE — Progress Notes (Signed)
Subjective:    Patient ID: Savannah Evans, female    DOB: [birth year], 60 y.o.   MRN: [id number]  HPI Happy to be off actos Needed the glipizide due to increases in blood sugar Takes it weekly--usually under 150. Occ can be in the 160's No hypoglycemic spells--except maybe once when she stayed on the beach a long time  No heart trouble Occ gets a "funny feeling". May just be soreness after working (with lots of reaching, etc) No palpitations No SOB or dedma  Stomach okay Reflux is quiet Tries to watch eating  Current Outpatient Prescriptions on File Prior to Visit  Medication Sig Dispense Refill  . Ascorbic Acid (VITAMIN C) 500 MG tablet Take 500 mg by mouth daily.        Marland Kitchen aspirin 81 MG tablet Take 81 mg by mouth daily.        . calcium-vitamin D (OSCAL) 250-125 MG-UNIT per tablet Take 1 tablet by mouth daily.        . fish oil-omega-3 fatty acids 1000 MG capsule Take 1 g by mouth daily.       Marland Kitchen glipiZIDE (GLUCOTROL) 5 MG tablet Take 1 tablet (5 mg total) by mouth 2 (two) times daily before a meal.  180 tablet  3  . metFORMIN (GLUCOPHAGE) 1000 MG tablet Take 1,000 mg by mouth 2 (two) times daily with meals.        . Multiple Vitamin (MULTIVITAMIN) tablet Take 1 tablet by mouth daily.        Marland Kitchen omeprazole (PRILOSEC OTC) 20 MG tablet Take 20 mg by mouth. Take 1-2 by mouth once daily       . simvastatin (ZOCOR) 10 MG tablet Take 10 mg by mouth daily.          No Known Allergies  Past Medical History  Diagnosis Date  . Allergic rhinitis   . Diabetes mellitus type 2, noninsulin dependent   . GERD (gastroesophageal reflux disease)   . Hyperlipidemia   . SVT (supraventricular tachycardia)     Past Surgical History  Procedure Date  . Svt ablation 2000    Dr. Graciela Husbands    Family History  Problem Relation Age of Onset  . Uterine cancer Sister   . Coronary artery disease    . Diabetes Sister   . Macular degeneration Mother     History   Social History  . Marital Status:  Married    Spouse Name: N/A    Number of Children: 3  . Years of Education: N/A   Occupational History  . 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 Smoking status: Never Smoker   . Smokeless tobacco: Not on file  . Alcohol Use: Yes  . Drug Use: Not on file  . Sexually Active: Not on file   Other Topics Concern  . Not on file   Social History Narrative  . No narrative on file   Review of Systems Appetite is okay Weight down 3# Sleeps okay for the most part--occ up with stress Bowels okay     Objective:   Physical Exam  Constitutional: She appears well-developed and well-nourished. No distress.  Neck: Normal range of motion. Neck supple. No thyromegaly present.  Cardiovascular: Normal rate, regular rhythm, normal heart sounds and intact distal pulses.  Exam reveals no gallop.   No murmur heard. Pulmonary/Chest: Effort normal and breath sounds normal. No respiratory distress. She has no wheezes. She has  no rales.  Musculoskeletal: Normal range of motion. She exhibits no edema and no tenderness.  Lymphadenopathy:    She has no cervical adenopathy.  Psychiatric: She has a normal mood and affect. Her behavior is normal. Judgment and thought content normal.          Assessment & Plan:

## 2011-05-22 ENCOUNTER — Telehealth: Payer: Self-pay | Admitting: Internal Medicine

## 2011-05-22 NOTE — Telephone Encounter (Signed)
Letter faxed.

## 2011-05-22 NOTE — Telephone Encounter (Signed)
Spoke with patient and she needs a letter with our letter head faxed to her with her vitals listed for her healthcare form for insurance, fax letter to [phone].

## 2011-05-22 NOTE — Telephone Encounter (Signed)
Requesting call back from [facility]

## 2011-06-12 ENCOUNTER — Other Ambulatory Visit: Payer: Self-pay | Admitting: *Deleted

## 2011-06-12 MED ORDER — METFORMIN HCL 1000 MG PO TABS: 1000.0000 mg | ORAL_TABLET | Freq: Two times a day (BID) | ORAL | Status: DC

## 2011-08-07 ENCOUNTER — Encounter: Payer: Self-pay | Admitting: Internal Medicine

## 2011-08-07 ENCOUNTER — Ambulatory Visit (INDEPENDENT_AMBULATORY_CARE_PROVIDER_SITE_OTHER): Payer: Managed Care, Other (non HMO) | Admitting: Internal Medicine

## 2011-08-07 VITALS — BP 128/80 | HR 81 | Temp 98.6°F | Ht 64.0 in | Wt 170.0 lb

## 2011-08-07 DIAGNOSIS — I498 Other specified cardiac arrhythmias: Secondary | ICD-10-CM

## 2011-08-07 DIAGNOSIS — I471 Supraventricular tachycardia: Secondary | ICD-10-CM

## 2011-08-07 DIAGNOSIS — K219 Gastro-esophageal reflux disease without esophagitis: Secondary | ICD-10-CM

## 2011-08-07 DIAGNOSIS — E119 Type 2 diabetes mellitus without complications: Secondary | ICD-10-CM

## 2011-08-07 DIAGNOSIS — E785 Hyperlipidemia, unspecified: Secondary | ICD-10-CM

## 2011-08-07 LAB — CBC WITH DIFFERENTIAL/PLATELET
Basophils Absolute: 0 10*3/uL (ref 0.0–0.1)
Basophils Relative: 0.5 % (ref 0.0–3.0)
Eosinophils Absolute: 0.2 10*3/uL (ref 0.0–0.7)
HCT: 35.5 % — ABNORMAL LOW (ref 36.0–46.0)
Hemoglobin: 11.6 g/dL — ABNORMAL LOW (ref 12.0–15.0)
Lymphs Abs: 2.4 10*3/uL (ref 0.7–4.0)
MCHC: 32.8 g/dL (ref 30.0–36.0)
Monocytes Relative: 7.1 % (ref 3.0–12.0)
Neutro Abs: 4.2 10*3/uL (ref 1.4–7.7)
RBC: 3.89 Mil/uL (ref 3.87–5.11)
RDW: 13.2 % (ref 11.5–14.6)

## 2011-08-07 LAB — LIPID PANEL
Cholesterol: 125 mg/dL (ref 0–200)
HDL: 47.9 mg/dL (ref >39.00)
Triglycerides: 87 mg/dL (ref 0.0–149.0)

## 2011-08-07 LAB — BASIC METABOLIC PANEL
CO2: 28 mEq/L (ref 19–32)
Glucose, Bld: 89 mg/dL (ref 70–99)
Potassium: 4.9 mEq/L (ref 3.5–5.1)
Sodium: 137 mEq/L (ref 135–145)

## 2011-08-07 LAB — HEMOGLOBIN A1C: Hgb A1c MFr Bld: 9 % — ABNORMAL HIGH (ref 4.6–6.5)

## 2011-08-07 LAB — HEPATIC FUNCTION PANEL
ALT: 15 U/L (ref 0–35)
Albumin: 4 g/dL (ref 3.5–5.2)
Alkaline Phosphatase: 52 U/L (ref 39–117)
Bilirubin, Direct: 0 mg/dL (ref 0.0–0.3)
Total Protein: 7.3 g/dL (ref 6.0–8.3)

## 2011-08-07 LAB — MICROALBUMIN / CREATININE URINE RATIO: Microalb Creat Ratio: 0.7 mg/g (ref 0.0–30.0)

## 2011-08-07 NOTE — Assessment & Plan Note (Signed)
No symptoms since ablation.

## 2011-08-07 NOTE — Assessment & Plan Note (Signed)
Seems to have about the same control Will try to dedicate herself more to fitness Check labs

## 2011-08-07 NOTE — Progress Notes (Signed)
Subjective:    Patient ID: Savannah Evans, female    DOB: 1950-07-21, 61 y.o.   MRN: [id number]  HPI Has had a cold for a few days Congestion now better Still some cough Using OTC meds ?allergies---uses OTC meds for this  Checks sugars once a week Variable-- 130-140's. Occ 160-170 No hypoglycemic reactions Just had eye exam Due for gyn and mammo soon Nestor Ramp)  Uses omeprazole daily No active reflux problems  On statin No myalgias or GI symptoms  Current Outpatient Prescriptions on File Prior to Visit  Medication Sig Dispense Refill  . Ascorbic Acid (VITAMIN C) 500 MG tablet Take 500 mg by mouth daily.        Marland Kitchen aspirin 81 MG tablet Take 81 mg by mouth daily.        . calcium-vitamin D (OSCAL) 250-125 MG-UNIT per tablet Take 1 tablet by mouth daily.        . fish oil-omega-3 fatty acids 1000 MG capsule Take 1 g by mouth daily.       Marland Kitchen glipiZIDE (GLUCOTROL) 5 MG tablet Take 1 tablet (5 mg total) by mouth 2 (two) times daily before a meal.  180 tablet  3  . metFORMIN (GLUCOPHAGE) 1000 MG tablet Take 1 tablet (1,000 mg total) by mouth 2 (two) times daily with a meal.  180 tablet  3  . Multiple Vitamin (MULTIVITAMIN) tablet Take 1 tablet by mouth daily.        Marland Kitchen omeprazole (PRILOSEC OTC) 20 MG tablet Take 20 mg by mouth. Take 1-2 by mouth once daily       . simvastatin (ZOCOR) 10 MG tablet Take 10 mg by mouth daily.          No Known Allergies  Past Medical History  Diagnosis Date  . Allergic rhinitis   . Diabetes mellitus type 2, noninsulin dependent   . GERD (gastroesophageal reflux disease)   . Hyperlipidemia   . SVT (supraventricular tachycardia)     Past Surgical History  Procedure Date  . Svt ablation 2000    Dr. Graciela Husbands    Family History  Problem Relation Age of Onset  . Uterine cancer Sister   . Coronary artery disease    . Diabetes Sister   . Macular degeneration Mother     History   Social History  . Marital Status: Married    Spouse Name:  N/A    Number of Children: 3  . Years of Education: N/A   Occupational History  . 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 Smoking status: Never Smoker   . Smokeless tobacco: Never Used  . Alcohol Use: Yes  . Drug Use: Not on file  . Sexually Active: Not on file   Other Topics Concern  . Not on file   Social History Narrative  . No narrative on file   Review of Systems Weight is down a couple of pounds Exercises intermittently--as much as 3 times per week Sleeps well in general    Objective:   Physical Exam  Constitutional: She appears well-developed and well-nourished. No distress.  Neck: Normal range of motion. Neck supple. No thyromegaly present.  Cardiovascular: Normal rate, regular rhythm, normal heart sounds and intact distal pulses.  Exam reveals no gallop.   No murmur heard. Pulmonary/Chest: Effort normal and breath sounds normal. No respiratory distress. She has no wheezes. She has no rales.  Abdominal: Soft. There is no tenderness.  Musculoskeletal: She exhibits no edema and no tenderness.  Lymphadenopathy:    She has no cervical adenopathy.  Psychiatric: She has a normal mood and affect. Her behavior is normal. Thought content normal.          Assessment & Plan:

## 2011-08-07 NOTE — Patient Instructions (Signed)
Please check to see if your insurance covers the zostavax (shingles vaccine)

## 2011-08-07 NOTE — Assessment & Plan Note (Signed)
No side effects Due for labs 

## 2011-08-07 NOTE — Assessment & Plan Note (Signed)
Controlled with the med 

## 2011-08-17 ENCOUNTER — Other Ambulatory Visit: Payer: Self-pay | Admitting: *Deleted

## 2011-08-17 ENCOUNTER — Encounter: Payer: Self-pay | Admitting: *Deleted

## 2011-08-17 MED ORDER — SIMVASTATIN 10 MG PO TABS: 10.0000 mg | ORAL_TABLET | Freq: Every day | ORAL | Status: DC

## 2011-08-17 MED ORDER — GLIPIZIDE 5 MG PO TABS: 5.0000 mg | ORAL_TABLET | Freq: Two times a day (BID) | ORAL | Status: DC

## 2011-10-23 ENCOUNTER — Other Ambulatory Visit (INDEPENDENT_AMBULATORY_CARE_PROVIDER_SITE_OTHER): Payer: Managed Care, Other (non HMO)

## 2011-10-23 DIAGNOSIS — E119 Type 2 diabetes mellitus without complications: Secondary | ICD-10-CM

## 2011-10-27 ENCOUNTER — Encounter: Payer: Self-pay | Admitting: *Deleted

## 2012-02-07 ENCOUNTER — Ambulatory Visit (INDEPENDENT_AMBULATORY_CARE_PROVIDER_SITE_OTHER): Payer: Managed Care, Other (non HMO) | Admitting: Internal Medicine

## 2012-02-07 ENCOUNTER — Encounter: Payer: Self-pay | Admitting: Internal Medicine

## 2012-02-07 VITALS — BP 132/80 | HR 95 | Temp 98.1°F | Ht 64.0 in | Wt 166.0 lb

## 2012-02-07 DIAGNOSIS — I471 Supraventricular tachycardia, unspecified: Secondary | ICD-10-CM

## 2012-02-07 DIAGNOSIS — K219 Gastro-esophageal reflux disease without esophagitis: Secondary | ICD-10-CM

## 2012-02-07 DIAGNOSIS — E119 Type 2 diabetes mellitus without complications: Secondary | ICD-10-CM

## 2012-02-07 DIAGNOSIS — I498 Other specified cardiac arrhythmias: Secondary | ICD-10-CM

## 2012-02-07 DIAGNOSIS — E785 Hyperlipidemia, unspecified: Secondary | ICD-10-CM

## 2012-02-07 NOTE — Progress Notes (Signed)
Subjective:    Patient ID: Savannah Evans, female    DOB: 1950-12-18, 61 y.o.   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 Ascorbic Acid (VITAMIN C) 500 MG tablet Take 500 mg by mouth daily.        Marland Kitchen aspirin 81 MG tablet Take 81 mg by mouth daily.        . calcium-vitamin D (OSCAL) 250-125 MG-UNIT per tablet Take 1 tablet by mouth daily.        . fish oil-omega-3 fatty acids 1000 MG capsule Take 1 g by mouth daily.       Marland Kitchen glipiZIDE (GLUCOTROL) 5 MG tablet Take 1 tablet (5 mg total) by mouth 2 (two) times daily before a meal.  180 tablet  3  . metFORMIN (GLUCOPHAGE) 1000 MG tablet Take 1 tablet (1,000 mg total) by mouth 2 (two) times daily with a meal.  180 tablet  3  . Multiple Vitamin (MULTIVITAMIN) tablet Take 1 tablet by mouth daily.        Marland Kitchen omeprazole (PRILOSEC OTC) 20 MG tablet Take 20 mg by mouth. Take 1-2 by mouth once daily       . simvastatin (ZOCOR) 10 MG tablet Take 1 tablet (10 mg total) by mouth daily.  90 tablet  3    No Known Allergies  Past Medical History  Diagnosis Date  . Allergic rhinitis   . Diabetes mellitus type 2, noninsulin dependent   . GERD (gastroesophageal reflux disease)   . Hyperlipidemia   . SVT (supraventricular tachycardia)     Past Surgical History  Procedure Date  . Svt ablation 2000    Dr. Graciela Husbands    Family History  Problem Relation Age of Onset  . Uterine cancer Sister   . Coronary artery disease    . Diabetes Sister   . Macular degeneration Mother     History   Social History    . Marital Status: Married    Spouse Name: N/A    Number of Children: 3  . Years of Education: N/A   Occupational History  . 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 Smoking status: Never Smoker   . Smokeless tobacco: Never Used  . Alcohol Use: Yes  . Drug Use: Not on file  . Sexually Active: Not on file   Other Topics Concern  . Not on file   Social History Narrative  . No narrative on file   Review of Systems Right 4th finger pops at the PIP at times. Hot water helps. Types all day long. Some pain with gripping Sleeps okay    Objective:   Physical Exam  Constitutional: She appears well-developed and well-nourished. No distress.  Neck: Normal range of motion. Neck supple. No thyromegaly present.  Cardiovascular: Normal rate, regular rhythm, normal heart sounds and intact distal pulses.  Exam reveals no gallop.   No murmur heard. Pulmonary/Chest: Effort normal and breath sounds normal. No respiratory distress. She has  no wheezes. She has no rales.  Abdominal: Soft. There is no tenderness.  Musculoskeletal: She exhibits no edema and no tenderness.  Lymphadenopathy:    She has no cervical adenopathy.  Psychiatric: She has a normal mood and affect. Her behavior is normal.          Assessment & Plan:

## 2012-02-07 NOTE — Assessment & Plan Note (Signed)
Quiet on the med 

## 2012-02-07 NOTE — Assessment & Plan Note (Signed)
No apparent relapse

## 2012-02-07 NOTE — Assessment & Plan Note (Signed)
No problems with meds Lab Results  Component Value Date   LDLCALC 60 08/07/2011

## 2012-02-07 NOTE — Assessment & Plan Note (Signed)
Did improve since last visit Will recheck Needs to continue with lifestyle

## 2012-02-21 ENCOUNTER — Encounter: Payer: Self-pay | Admitting: *Deleted

## 2012-05-20 ENCOUNTER — Telehealth: Payer: Self-pay | Admitting: Internal Medicine

## 2012-05-20 ENCOUNTER — Other Ambulatory Visit (INDEPENDENT_AMBULATORY_CARE_PROVIDER_SITE_OTHER): Payer: Managed Care, Other (non HMO)

## 2012-05-20 DIAGNOSIS — E119 Type 2 diabetes mellitus without complications: Secondary | ICD-10-CM

## 2012-05-20 NOTE — Telephone Encounter (Signed)
Savannah Evans dropped off a physcial form to be filled out.

## 2012-05-20 NOTE — Telephone Encounter (Signed)
Form faxed

## 2012-05-20 NOTE — Telephone Encounter (Signed)
Form on your desk to be filled out.

## 2012-05-20 NOTE — Telephone Encounter (Signed)
Form done and sent for faxing

## 2012-06-04 ENCOUNTER — Ambulatory Visit (INDEPENDENT_AMBULATORY_CARE_PROVIDER_SITE_OTHER): Payer: Managed Care, Other (non HMO) | Admitting: Internal Medicine

## 2012-06-04 ENCOUNTER — Encounter: Payer: Self-pay | Admitting: Internal Medicine

## 2012-06-04 VITALS — BP 128/80 | HR 96 | Temp 98.6°F | Wt 167.0 lb

## 2012-06-04 DIAGNOSIS — Z23 Encounter for immunization: Secondary | ICD-10-CM

## 2012-06-04 DIAGNOSIS — E119 Type 2 diabetes mellitus without complications: Secondary | ICD-10-CM | POA: Insufficient documentation

## 2012-06-04 MED ORDER — METFORMIN HCL 1000 MG PO TABS: 1000.0000 mg | ORAL_TABLET | Freq: Two times a day (BID) | ORAL | Status: DC

## 2012-06-04 MED ORDER — SITAGLIPTIN PHOSPHATE 100 MG PO TABS: 100.0000 mg | ORAL_TABLET | Freq: Every day | ORAL | Status: DC

## 2012-06-04 MED ORDER — SIMVASTATIN 10 MG PO TABS: 10.0000 mg | ORAL_TABLET | Freq: Every day | ORAL | Status: DC

## 2012-06-04 MED ORDER — GLIPIZIDE 5 MG PO TABS: 5.0000 mg | ORAL_TABLET | Freq: Two times a day (BID) | ORAL | Status: DC

## 2012-06-04 NOTE — Progress Notes (Signed)
  Subjective:    Patient ID: Savannah Evans, female    DOB: 01/12/1951, 62 y.o.   MRN: [id number]  HPI Here to discuss elevated A1c Is now trying to increase her protein (and less carbs) Feels she may not be eating the right foods  Is a stress eater---knows it is not the right foods Did have stressful quarter at work--not sleeping right, eating the wrong things Thinks [facility] may have programs to help them  This AM she was 139 Had been in 170's fasting before  Current Outpatient Prescriptions on File Prior to Visit  Medication Sig Dispense Refill  . Ascorbic Acid (VITAMIN C) 500 MG tablet Take 500 mg by mouth daily.        Marland Kitchen aspirin 81 MG tablet Take 81 mg by mouth daily.        . calcium-vitamin D (OSCAL) 250-125 MG-UNIT per tablet Take 1 tablet by mouth daily.        . fish oil-omega-3 fatty acids 1000 MG capsule Take 1 g by mouth daily.       Marland Kitchen glipiZIDE (GLUCOTROL) 5 MG tablet Take 1 tablet (5 mg total) by mouth 2 (two) times daily before a meal.  180 tablet  3  . metFORMIN (GLUCOPHAGE) 1000 MG tablet Take 1 tablet (1,000 mg total) by mouth 2 (two) times daily with a meal.  180 tablet  3  . Multiple Vitamin (MULTIVITAMIN) tablet Take 1 tablet by mouth daily.        Marland Kitchen omeprazole (PRILOSEC OTC) 20 MG tablet Take 20 mg by mouth. Take 1-2 by mouth once daily       . simvastatin (ZOCOR) 10 MG tablet Take 1 tablet (10 mg total) by mouth daily.  90 tablet  3    No Known Allergies  Past Medical History  Diagnosis Date  . Allergic rhinitis   . Diabetes mellitus type 2, noninsulin dependent   . GERD (gastroesophageal reflux disease)   . Hyperlipidemia   . SVT (supraventricular tachycardia)     Past Surgical History  Procedure Date  . Svt ablation 2000    Dr. Graciela Husbands    Family History  Problem Relation Age of Onset  . Uterine cancer Sister   . Coronary artery disease    . Diabetes Sister   . Macular degeneration Mother     History   Social History  . Marital Status:  Married    Spouse Name: N/A    Number of Children: 3  . Years of Education: N/A   Occupational History  . 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 Smoking status: Never Smoker   . Smokeless tobacco: Never Used  . Alcohol Use: Yes  . Drug Use: Not on file  . Sexually Active: Not on file   Other Topics Concern  . Not on file   Social History Narrative  . No narrative on file   Review of Systems     Objective:   Physical Exam        Assessment & Plan:

## 2012-06-04 NOTE — Assessment & Plan Note (Signed)
Discussed plans and counseled all of 15 minute visit Will add januvia (prefers no shots if she can avoid it) Referral for diabetic refresher course

## 2012-06-26 ENCOUNTER — Ambulatory Visit: Payer: Managed Care, Other (non HMO) | Admitting: *Deleted

## 2012-08-05 ENCOUNTER — Ambulatory Visit (INDEPENDENT_AMBULATORY_CARE_PROVIDER_SITE_OTHER): Payer: Managed Care, Other (non HMO) | Admitting: Internal Medicine

## 2012-08-05 ENCOUNTER — Encounter: Payer: Self-pay | Admitting: Internal Medicine

## 2012-08-05 VITALS — BP 128/80 | HR 95 | Temp 98.0°F | Wt 165.0 lb

## 2012-08-05 DIAGNOSIS — IMO0001 Reserved for inherently not codable concepts without codable children: Secondary | ICD-10-CM

## 2012-08-05 DIAGNOSIS — E785 Hyperlipidemia, unspecified: Secondary | ICD-10-CM

## 2012-08-05 DIAGNOSIS — K219 Gastro-esophageal reflux disease without esophagitis: Secondary | ICD-10-CM

## 2012-08-05 LAB — HEPATIC FUNCTION PANEL
AST: 19 U/L (ref 0–37)
Alkaline Phosphatase: 47 U/L (ref 39–117)
Bilirubin, Direct: 0 mg/dL (ref 0.0–0.3)
Total Bilirubin: 0.3 mg/dL (ref 0.3–1.2)

## 2012-08-05 LAB — LIPID PANEL
HDL: 56.6 mg/dL (ref >39.00)
LDL Cholesterol: 86 mg/dL (ref 0–99)
Total CHOL/HDL Ratio: 3
VLDL: 16.8 mg/dL (ref 0.0–40.0)

## 2012-08-05 LAB — TSH: TSH: 1.97 u[IU]/mL (ref 0.35–5.50)

## 2012-08-05 LAB — CBC WITH DIFFERENTIAL/PLATELET
Eosinophils Relative: 1 % (ref 0.0–5.0)
HCT: 35 % — ABNORMAL LOW (ref 36.0–46.0)
Lymphocytes Relative: 35.3 % (ref 12.0–46.0)
Monocytes Relative: 8.3 % (ref 3.0–12.0)
Neutrophils Relative %: 54.7 % (ref 43.0–77.0)
Platelets: 794 10*3/uL — ABNORMAL HIGH (ref 150.0–400.0)
WBC: 6.9 10*3/uL (ref 4.5–10.5)

## 2012-08-05 LAB — BASIC METABOLIC PANEL
CO2: 26 mEq/L (ref 19–32)
Calcium: 9.7 mg/dL (ref 8.4–10.5)
Chloride: 100 mEq/L (ref 96–112)
Glucose, Bld: 92 mg/dL (ref 70–99)
Sodium: 137 mEq/L (ref 135–145)

## 2012-08-05 LAB — HEMOGLOBIN A1C: Hgb A1c MFr Bld: 7.7 % — ABNORMAL HIGH (ref 4.6–6.5)

## 2012-08-05 NOTE — Assessment & Plan Note (Signed)
Does fine with the regular PPI

## 2012-08-05 NOTE — Assessment & Plan Note (Signed)
No problems with the med Due for labs 

## 2012-08-05 NOTE — Assessment & Plan Note (Signed)
Lab Results  Component Value Date   HGBA1C 8.8* 05/20/2012   Hopefully has better control now Almost 3 months so will recheck now
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
## 2012-08-19 ENCOUNTER — Telehealth: Payer: Self-pay

## 2012-08-19 NOTE — Telephone Encounter (Signed)
pts husband going on medicare in May and pt wants all med sent to same local pharmacy if possible. Pt will ck with insurance co to see if coverage is same for local and mail order pharmacy and call back if needs to switch pharmacies.

## 2012-08-26 ENCOUNTER — Telehealth: Payer: Self-pay | Admitting: Internal Medicine

## 2012-08-26 ENCOUNTER — Other Ambulatory Visit (INDEPENDENT_AMBULATORY_CARE_PROVIDER_SITE_OTHER): Payer: Managed Care, Other (non HMO)

## 2012-08-26 DIAGNOSIS — R7989 Other specified abnormal findings of blood chemistry: Secondary | ICD-10-CM

## 2012-08-26 DIAGNOSIS — D473 Essential (hemorrhagic) thrombocythemia: Secondary | ICD-10-CM

## 2012-08-26 LAB — CBC WITH DIFFERENTIAL/PLATELET
Basophils Absolute: 0 10*3/uL (ref 0.0–0.1)
HCT: 33.4 % — ABNORMAL LOW (ref 36.0–46.0)
Hemoglobin: 11.3 g/dL — ABNORMAL LOW (ref 12.0–15.0)
Lymphs Abs: 1.9 10*3/uL (ref 0.7–4.0)
MCV: 90.4 fl (ref 78.0–100.0)
Monocytes Absolute: 0.4 10*3/uL (ref 0.1–1.0)
Neutro Abs: 2.8 10*3/uL (ref 1.4–7.7)
Platelets: 718 10*3/uL — ABNORMAL HIGH (ref 150.0–400.0)
RDW: 13.4 % (ref 11.5–14.6)

## 2012-08-26 NOTE — Telephone Encounter (Signed)
Discussed her lab work Will proceed with hematology evaluation

## 2012-08-27 ENCOUNTER — Telehealth: Payer: Self-pay | Admitting: Oncology

## 2012-08-27 NOTE — Telephone Encounter (Signed)
S/W PT IN RE TO NP APPT 5/09 @ 10:30 W/DR. HA REFERRING DR. Alphonsus Sias DX- THROMBOCYTOSIS WELCOME PACKET MAILED.

## 2012-08-28 ENCOUNTER — Telehealth: Payer: Self-pay | Admitting: Oncology

## 2012-08-28 NOTE — Telephone Encounter (Signed)
C/D 4/16/14o for appt. 09/20/12

## 2012-09-20 ENCOUNTER — Encounter: Payer: Self-pay | Admitting: Oncology

## 2012-09-20 ENCOUNTER — Telehealth: Payer: Self-pay | Admitting: Oncology

## 2012-09-20 ENCOUNTER — Other Ambulatory Visit (HOSPITAL_BASED_OUTPATIENT_CLINIC_OR_DEPARTMENT_OTHER): Payer: Managed Care, Other (non HMO) | Admitting: Lab

## 2012-09-20 ENCOUNTER — Ambulatory Visit: Payer: Managed Care, Other (non HMO)

## 2012-09-20 ENCOUNTER — Ambulatory Visit (HOSPITAL_BASED_OUTPATIENT_CLINIC_OR_DEPARTMENT_OTHER): Payer: Managed Care, Other (non HMO) | Admitting: Oncology

## 2012-09-20 VITALS — BP 150/68 | HR 90 | Temp 98.0°F | Resp 20 | Ht 64.0 in | Wt 164.5 lb

## 2012-09-20 DIAGNOSIS — D473 Essential (hemorrhagic) thrombocythemia: Secondary | ICD-10-CM

## 2012-09-20 DIAGNOSIS — D649 Anemia, unspecified: Secondary | ICD-10-CM

## 2012-09-20 LAB — CBC WITH DIFFERENTIAL/PLATELET
BASO%: 1.1 % (ref 0.0–2.0)
EOS%: 2.1 % (ref 0.0–7.0)
HCT: 34.8 % (ref 34.8–46.6)
LYMPH%: 35.4 % (ref 14.0–49.7)
MCH: 30.6 pg (ref 25.1–34.0)
MCHC: 34.4 g/dL (ref 31.5–36.0)
NEUT%: 54.9 % (ref 38.4–76.8)
RBC: 3.9 10*6/uL (ref 3.70–5.45)
WBC: 5.8 10*3/uL (ref 3.9–10.3)
lymph#: 2.1 10*3/uL (ref 0.9–3.3)

## 2012-09-20 LAB — CHCC SMEAR

## 2012-09-20 NOTE — Patient Instructions (Addendum)
1.  Issue:  Thrombocytosis (elevated platelet count).  2.  Potential causes:  Reactive vs essential thrombocytosis ET (a condition where the bone marrow keeps producing platelet without feedback mechanism; increased risk of blood clot) vs chronic myeloid leukemia (CML). 3.  Work up:  Blood test to rule out ET and CML.  If blood test is negative, consider diagnostic bone marrow biopsy.  4.  Return to clinic after these work up in about 3-4 weeks.

## 2012-09-20 NOTE — Progress Notes (Signed)
Checked in new pt with no financial concerns. °

## 2012-09-20 NOTE — Progress Notes (Signed)
[facility]  [phone] [phone]     INITIAL HEMATOLOGY CONSULTATION    Referral MD:  Karie Schwalbe, M.D.  Reason for Referral: thrombocytosis.     HPI: Mr. Savannah Evans is a 62 year-old woman with DM, HLP, iron deficiency history.  She never required pRBC or IV iron infusion.  She has had slightly worsening thrombocytosis for the past 3 years.  She was kindly referred to the [facility] for evaluation.  Savannah Evans presented today to the clinic with her husband for the first time.  She has mild osteoarthritic pain in the fingers.  She denied diffuse bone pain, skin rash. The rest of the 14-point review of system was negative.   She denies fever, anorexia, weight loss, fatigue, headache, visual changes, confusion, drenching night sweats, palpable lymph node swelling, mucositis, odynophagia, dysphagia, nausea vomiting, jaundice, chest pain, palpitation, shortness of breath, dyspnea on exertion, productive cough, gum bleeding, epistaxis, hematemesis, hemoptysis, abdominal pain, abdominal swelling, early satiety, melena, hematochezia, hematuria, skin rash, spontaneous bleeding, heat or cold intolerance, bowel bladder incontinence, back pain, focal motor weakness, paresthesia, depression.     Past Medical History  Diagnosis Date  . Allergic rhinitis   . Diabetes mellitus type 2, noninsulin dependent   . GERD (gastroesophageal reflux disease)   . Hyperlipidemia   . SVT (supraventricular tachycardia)   . Osteoarthritis   . Diaphragmatic hernia   . Iron deficiency   :    Past Surgical History  Procedure Laterality Date  . Svt ablation  2000    Dr. Graciela Husbands  . Colonoscopy      neg age 65.  Next age 61.   :   CURRENT MEDS: Current Outpatient Prescriptions  Medication Sig Dispense Refill  . Ascorbic Acid (VITAMIN C) 500 MG tablet Take 500 mg by mouth daily.        Marland Kitchen aspirin 81 MG tablet Take 81 mg by mouth daily.        .  calcium-vitamin D (OSCAL) 250-125 MG-UNIT per tablet Take 1 tablet by mouth daily.        . cetirizine (ZYRTEC) 10 MG tablet Take 10 mg by mouth daily. As needed for allergies      . fish oil-omega-3 fatty acids 1000 MG capsule Take 1 g by mouth daily.       Marland Kitchen glipiZIDE (GLUCOTROL) 5 MG tablet Take 1 tablet (5 mg total) by mouth 2 (two) times daily before a meal.  180 tablet  3  . metFORMIN (GLUCOPHAGE) 1000 MG tablet Take 1 tablet (1,000 mg total) by mouth 2 (two) times daily with a meal.  180 tablet  3  . Multiple Vitamin (MULTIVITAMIN) tablet Take 1 tablet by mouth daily.        Marland Kitchen omeprazole (PRILOSEC OTC) 20 MG tablet Take 20 mg by mouth. Take 1-2 by mouth once daily       . simvastatin (ZOCOR) 10 MG tablet Take 1 tablet (10 mg total) by mouth daily.  90 tablet  3  . sitaGLIPtin (JANUVIA) 100 MG tablet Take 1 tablet (100 mg total) by mouth daily.  90 tablet  3   No current facility-administered medications for this visit.      No Known Allergies:  Family History  Problem Relation Age of Onset  . Uterine cancer Sister   . Cancer Sister     uterine cancer  . Coronary artery disease    . Diabetes Sister   . Macular  degeneration Mother   . Heart attack Father   . Cancer Paternal Aunt     cancer?  . Cancer Paternal Uncle     cancer?  :  History   Social History  . Marital Status: Married    Spouse Name: N/A    Number of Children: 3  . Years of Education: N/A   Occupational History  . 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 Smoking status: Never Smoker   . Smokeless tobacco: Never Used  . Alcohol Use: 0.6 oz/week    1 Glasses of wine per week  . Drug Use: No  . Sexually Active: Not on file   Other Topics Concern  . Not on file   Social History Narrative  . No narrative on file  :  REVIEW OF SYSTEM:  The rest of the 14-point review of sytem was negative.   Exam: ECOG 0   General:  well-nourished woman, in no  acute distress.  Eyes:  no scleral icterus.  ENT:  There were no oropharyngeal lesions.  Neck was without thyromegaly.  Lymphatics:  Negative cervical, supraclavicular or axillary adenopathy.  Respiratory: lungs were clear bilaterally without wheezing or crackles.  Cardiovascular:  Regular rate and rhythm, S1/S2, without murmur, rub or gallop.  There was no pedal edema.  GI:  abdomen was soft, flat, nontender, nondistended, without organomegaly.  Muscoloskeletal:  no spinal tenderness of palpation of vertebral spine.  Skin exam was without echymosis, petichae.  Neuro exam was nonfocal.  Patient was able to get on and off exam table without assistance.  Gait was normal.  Patient was alert and oriented.  Attention was good.   Language was appropriate.  Mood was normal without depression.  Speech was not pressured.  Thought content was not tangential.    LABS:  Lab Results  Component Value Date   WBC 5.3 08/26/2012   HGB 11.3* 08/26/2012   HCT 33.4* 08/26/2012   PLT 718.0* 08/26/2012   GLUCOSE 92 08/05/2012   CHOL 159 08/05/2012   TRIG 84.0 08/05/2012   HDL 56.60 08/05/2012   LDLCALC 86 08/05/2012   ALT 18 08/05/2012   AST 19 08/05/2012   NA 137 08/05/2012   K 4.5 08/05/2012   CL 100 08/05/2012   CREATININE 0.6 08/05/2012   BUN 6 08/05/2012   CO2 26 08/05/2012   HGBA1C 7.7* 08/05/2012   MICROALBUR 0.4 08/05/2012     ASSESSMENT AND PLAN:   1.  Issue:  Thrombocytosis (elevated platelet count).  2.  Potential causes:  Reactive vs iron deficiency vs essential thrombocytosis ET (a condition where the bone marrow keeps producing platelet without feedback mechanism; increased risk of blood clot) vs chronic myeloid leukemia (CML). 3.  Work up:  Blood test to evaluate for iron deficiency; rule out ET and CML.  If blood test is negative, consider diagnostic bone marrow biopsy to rule out occult .  4.  Return to clinic after these work up in about 3-4 weeks.   The length of time of the face-to-face encounter was  45 minutes. More than 50% of time was spent counseling and coordination of care.     Thank you for this referral.

## 2012-09-24 ENCOUNTER — Encounter: Payer: Self-pay | Admitting: Oncology

## 2012-09-30 ENCOUNTER — Telehealth: Payer: Self-pay | Admitting: *Deleted

## 2012-09-30 NOTE — Telephone Encounter (Signed)
Discussed bone marrow biopsy options with pt.  To either have done here by Dr. Gaylyn Rong or Interventional Radiology.   Pt will think about both options and call back by [facility] 5/21 w/ answer.  She also wanted Dr. Gaylyn Rong to be aware she missed his phone calls and voice mail on her cell phone.  She apologized,  States just saw that he called and left a message yesterday.

## 2012-09-30 NOTE — Telephone Encounter (Signed)
Pt called and s/w Reesa Chew, RN.  Pt received Dr. Lodema Pilot letter w/ instructions to call us to arrange bone marrow biopsy.  Called pt back and left her a VM to return nurse's call to discuss.

## 2012-10-02 ENCOUNTER — Telehealth: Payer: Self-pay | Admitting: Oncology

## 2012-10-02 ENCOUNTER — Other Ambulatory Visit: Payer: Self-pay | Admitting: Oncology

## 2012-10-02 ENCOUNTER — Telehealth: Payer: Self-pay | Admitting: *Deleted

## 2012-10-02 DIAGNOSIS — D473 Essential (hemorrhagic) thrombocythemia: Secondary | ICD-10-CM

## 2012-10-02 NOTE — Telephone Encounter (Signed)
Pt states has decided she prefers to have Bone Marrow Biopsy done in Interventional Radiology.  Informed her Dr. Gaylyn Rong will place the order and to expect call from Radiology to schedule the biopsy.  Asked her to call us with date so we can adjust her next office visit accordingly.  She verbalized understanding.

## 2012-10-02 NOTE — Telephone Encounter (Signed)
I'll place order.  Thanks.

## 2012-10-03 ENCOUNTER — Other Ambulatory Visit: Payer: Self-pay | Admitting: Oncology

## 2012-10-04 ENCOUNTER — Telehealth: Payer: Self-pay | Admitting: Oncology

## 2012-10-04 ENCOUNTER — Telehealth: Payer: Self-pay | Admitting: *Deleted

## 2012-10-04 NOTE — Telephone Encounter (Signed)
Pt called to ask if she still needs to keep her appt w/ Dr. Gaylyn Rong on 5/29, although her BMBx is on 6/02 and f/u w/ Dr. Gaylyn Rong on 6/09. Informed her she does not need this appt on 5/29, just see Dr. Gaylyn Rong on 6/09 after the BMBx.  She verbalized understanding.

## 2012-10-08 ENCOUNTER — Encounter (HOSPITAL_COMMUNITY): Payer: Self-pay | Admitting: Pharmacy Technician

## 2012-10-10 ENCOUNTER — Ambulatory Visit: Payer: Managed Care, Other (non HMO) | Admitting: Oncology

## 2012-10-11 ENCOUNTER — Other Ambulatory Visit: Payer: Self-pay | Admitting: Radiology

## 2012-10-14 ENCOUNTER — Ambulatory Visit (HOSPITAL_COMMUNITY)
Admission: RE | Admit: 2012-10-14 | Discharge: 2012-10-14 | Disposition: A | Discharge status: Home / Self Care | Payer: Managed Care, Other (non HMO) | Source: Ambulatory Visit | Attending: Oncology | Admitting: Oncology

## 2012-10-14 ENCOUNTER — Encounter (HOSPITAL_COMMUNITY): Payer: Self-pay

## 2012-10-14 DIAGNOSIS — D473 Essential (hemorrhagic) thrombocythemia: Secondary | ICD-10-CM | POA: Insufficient documentation

## 2012-10-14 DIAGNOSIS — D649 Anemia, unspecified: Secondary | ICD-10-CM | POA: Insufficient documentation

## 2012-10-14 LAB — GLUCOSE, CAPILLARY
Glucose-Capillary: 139 mg/dL — ABNORMAL HIGH (ref 70–99)
Glucose-Capillary: 163 mg/dL — ABNORMAL HIGH (ref 70–99)

## 2012-10-14 LAB — CBC
Platelets: 721 10*3/uL — ABNORMAL HIGH (ref 150–400)
RBC: 3.79 MIL/uL — ABNORMAL LOW (ref 3.87–5.11)
RDW: 12.8 % (ref 11.5–15.5)
WBC: 6.3 10*3/uL (ref 4.0–10.5)

## 2012-10-14 LAB — PROTIME-INR: INR: 0.93 (ref 0.00–1.49)

## 2012-10-14 MED ORDER — MIDAZOLAM HCL 2 MG/2ML IJ SOLN
INTRAMUSCULAR | Status: AC
  Filled 2012-10-14: qty 4

## 2012-10-14 MED ORDER — MIDAZOLAM HCL 2 MG/2ML IJ SOLN
INTRAMUSCULAR | Status: AC | PRN
  Administered 2012-10-14: 2 mg via INTRAVENOUS

## 2012-10-14 MED ORDER — FENTANYL CITRATE 0.05 MG/ML IJ SOLN
INTRAMUSCULAR | Status: AC
  Filled 2012-10-14: qty 4

## 2012-10-14 MED ORDER — FENTANYL CITRATE 0.05 MG/ML IJ SOLN
INTRAMUSCULAR | Status: AC | PRN
  Administered 2012-10-14: 100 ug via INTRAVENOUS

## 2012-10-14 MED ORDER — SODIUM CHLORIDE 0.9 % IV SOLN
INTRAVENOUS | Status: DC
  Administered 2012-10-14: 20 mL/h via INTRAVENOUS

## 2012-10-14 NOTE — H&P (Signed)
Agree 

## 2012-10-14 NOTE — Procedures (Addendum)
(  No note.) Procedure:  CT guided bone marrow biopsy Findings:  Right iliac bone marrow aspirate and core biopsy.  No complications.

## 2012-10-14 NOTE — H&P (Signed)
Savannah Evans is an 62 y.o. female.   Chief Complaint: "I'm getting a bone marrow biopsy" HPI: Patient with history of thrombocytosis presents today for CT guided bone marrow biopsy.  Past Medical History  Diagnosis Date  . Allergic rhinitis   . Diabetes mellitus type 2, noninsulin dependent   . GERD (gastroesophageal reflux disease)   . Hyperlipidemia   . SVT (supraventricular tachycardia)   . Osteoarthritis   . Diaphragmatic hernia   . Iron deficiency     Past Surgical History  Procedure Laterality Date  . Svt ablation  2000    Dr. Graciela Husbands  . Colonoscopy      neg age 59.  Next age 57.     Family History  Problem Relation Age of Onset  . Uterine cancer Sister   . Cancer Sister     uterine cancer  . Coronary artery disease    . Diabetes Sister   . Macular degeneration Mother   . Heart attack Father   . Cancer Paternal Aunt     cancer?  . Cancer Paternal Uncle     cancer?   Social History:  reports that she has never smoked. She has never used smokeless tobacco. She reports that she drinks about 0.6 ounces of alcohol per week. She reports that she does not use illicit drugs.  Allergies: No Known Allergies  Current outpatient prescriptions:Ascorbic Acid (VITAMIN C) 500 MG tablet, Take 500 mg by mouth daily.  , Disp: , Rfl: ;  aspirin 81 MG tablet, Take 81 mg by mouth daily.  , Disp: , Rfl: ;  calcium-vitamin D (OSCAL) 250-125 MG-UNIT per tablet, Take 1 tablet by mouth daily.  , Disp: , Rfl: ;  cetirizine (ZYRTEC) 10 MG tablet, Take 10 mg by mouth daily as needed for allergies. , Disp: , Rfl:  fish oil-omega-3 fatty acids 1000 MG capsule, Take 1 g by mouth daily. , Disp: , Rfl: ;  glipiZIDE (GLUCOTROL) 5 MG tablet, Take 1 tablet (5 mg total) by mouth 2 (two) times daily before a meal., Disp: 180 tablet, Rfl: 3;  metFORMIN (GLUCOPHAGE) 1000 MG tablet, Take 1 tablet (1,000 mg total) by mouth 2 (two) times daily with a meal., Disp: 180 tablet, Rfl: 3;  Multiple Vitamin  (MULTIVITAMIN) tablet, Take 1 tablet by mouth daily.  , Disp: , Rfl:  omeprazole (PRILOSEC OTC) 20 MG tablet, Take 20 mg by mouth daily. , Disp: , Rfl: ;  simvastatin (ZOCOR) 10 MG tablet, Take 1 tablet (10 mg total) by mouth daily., Disp: 90 tablet, Rfl: 3;  sitaGLIPtin (JANUVIA) 100 MG tablet, Take 1 tablet (100 mg total) by mouth daily., Disp: 90 tablet, Rfl: 3 Current facility-administered medications:0.9 %  sodium chloride infusion, , Intravenous, Continuous, Brayton El, PA-C, Last Rate: 20 mL/hr at 10/14/12 0857, 20 mL/hr at 10/14/12 0857   Results for orders placed during the hospital encounter of 10/14/12 (from the past 48 hour(s))  CBC     Status: Abnormal   Collection Time    10/14/12  8:55 AM      Result Value Range   WBC 6.3  4.0 - 10.5 K/uL   RBC 3.79 (*) 3.87 - 5.11 MIL/uL   Hemoglobin 11.4 (*) 12.0 - 15.0 g/dL   HCT 40.9 (*) 81.1 - 91.4 %   MCV 88.1  78.0 - 100.0 fL   MCH 30.1  26.0 - 34.0 pg   MCHC 34.1  30.0 - 36.0 g/dL   RDW 78.2  95.6 -  15.5 %   Platelets 721 (*) 150 - 400 K/uL  PROTIME-INR     Status: None   Collection Time    10/14/12  8:55 AM      Result Value Range   Prothrombin Time 12.4  11.6 - 15.2 seconds   INR 0.93  0.00 - 1.49  GLUCOSE, CAPILLARY     Status: Abnormal   Collection Time    10/14/12  9:11 AM      Result Value Range   Glucose-Capillary 139 (*) 70 - 99 mg/dL   No results found.  Review of Systems  Constitutional: Negative for fever and chills.  Respiratory: Negative for cough and shortness of breath.   Cardiovascular: Negative for chest pain.  Gastrointestinal: Negative for nausea, vomiting and abdominal pain.  Musculoskeletal: Negative for back pain.  Neurological: Negative for headaches.  Endo/Heme/Allergies: Does not bruise/bleed easily.   Vitals: BP 125/62  HR 89  R 18  TEMP 98.5  O2 SATS 100%RA Physical Exam  Constitutional: She is oriented to person, place, and time. She appears well-developed and well-nourished.   Cardiovascular: Normal rate and regular rhythm.   Respiratory: Effort normal and breath sounds normal.  GI: Soft. Bowel sounds are normal. There is no tenderness.  Musculoskeletal: Normal range of motion. She exhibits no edema.  Neurological: She is alert and oriented to person, place, and time.     Assessment/Plan Pt with hx thrombocytosis. Plan is for CT guided bone marrow biopsy today. Details/risks of procedure d/w pt/husband with their understanding and consent.  ALLRED,D KEVIN 10/14/2012, 9:30 AM

## 2012-10-21 ENCOUNTER — Telehealth: Payer: Self-pay | Admitting: Oncology

## 2012-10-21 ENCOUNTER — Encounter: Payer: Self-pay | Admitting: Oncology

## 2012-10-21 ENCOUNTER — Ambulatory Visit (HOSPITAL_BASED_OUTPATIENT_CLINIC_OR_DEPARTMENT_OTHER): Payer: Managed Care, Other (non HMO) | Admitting: Oncology

## 2012-10-21 VITALS — BP 147/68 | HR 84 | Temp 97.2°F | Resp 18 | Ht 64.0 in | Wt 166.7 lb

## 2012-10-21 DIAGNOSIS — D473 Essential (hemorrhagic) thrombocythemia: Secondary | ICD-10-CM | POA: Insufficient documentation

## 2012-10-21 HISTORY — DX: Essential (hemorrhagic) thrombocythemia: D47.3

## 2012-10-21 MED ORDER — HYDROXYUREA 500 MG PO CAPS: 500.0000 mg | ORAL_CAPSULE | Freq: Every day | ORAL | Status: DC

## 2012-10-21 NOTE — Telephone Encounter (Signed)
gv and printed appt sched and avs for pt  °

## 2012-10-21 NOTE — Patient Instructions (Addendum)
1.  Diagnosis:  ET (Essential thrombocythemia). 2.  Prognosis:  Normally a benign disease with the exception of blood clot.  There is also chance of transformation to myelofibrosis (low blood counts) or acute leukemia.  The risk is low. 3.  Recommendations:  *  Start Hydrea with goal to lower platelet count to decrease risk of blood clot.  *  Continue Aspirin 81mg  by mouth once daily to decrease risk of blood clot.  4.  Follow up:    *  Weekly blood test while starting Hydrea.  *  Follow up in about 1 month.

## 2012-10-21 NOTE — Progress Notes (Signed)
[facility]  [phone] [phone]   OFFICE PROGRESS NOTE   Cc:  Tillman Abide, MD  DIAGNOSIS:  JAK2-negative essential thrombocythemia (ET); BCR-ABL negative.   PAST THERAPY:  Biopsy only.   CURRENT THERAPY: here to discuss therapy.   INTERVAL HISTORY: Savannah Evans 62 y.o. female returns for discussion of work up.  Her husband was here with her.  Patient denies fever, anorexia, weight loss, fatigue, headache, visual changes, confusion, drenching night sweats, palpable lymph node swelling, mucositis, odynophagia, dysphagia, nausea vomiting, jaundice, chest pain, palpitation, shortness of breath, dyspnea on exertion, productive cough, gum bleeding, epistaxis, hematemesis, hemoptysis, abdominal pain, abdominal swelling, early satiety, melena, hematochezia, hematuria, skin rash, spontaneous bleeding, joint swelling, joint pain, heat or cold intolerance, bowel bladder incontinence, back pain, focal motor weakness, paresthesia, depression.    Past Medical History  Diagnosis Date  . Allergic rhinitis   . Diabetes mellitus type 2, noninsulin dependent   . GERD (gastroesophageal reflux disease)   . Hyperlipidemia   . SVT (supraventricular tachycardia)   . Osteoarthritis   . Diaphragmatic hernia   . Iron deficiency   . Essential thrombocythemia 10/21/2012    Past Surgical History  Procedure Laterality Date  . Svt ablation  2000    Dr. Graciela Husbands  . Colonoscopy      neg age 51.  Next age 4.     Current Outpatient Prescriptions  Medication Sig Dispense Refill  . Ascorbic Acid (VITAMIN C) 500 MG tablet Take 500 mg by mouth daily.        Marland Kitchen aspirin 81 MG tablet Take 81 mg by mouth daily.        . calcium-vitamin D (OSCAL) 250-125 MG-UNIT per tablet Take 1 tablet by mouth daily.        . cetirizine (ZYRTEC) 10 MG tablet Take 10 mg by mouth daily as needed for allergies.       . fish oil-omega-3 fatty acids 1000 MG capsule Take 1 g by mouth daily.        Marland Kitchen glipiZIDE (GLUCOTROL) 5 MG tablet Take 1 tablet (5 mg total) by mouth 2 (two) times daily before a meal.  180 tablet  3  . metFORMIN (GLUCOPHAGE) 1000 MG tablet Take 1 tablet (1,000 mg total) by mouth 2 (two) times daily with a meal.  180 tablet  3  . Multiple Vitamin (MULTIVITAMIN) tablet Take 1 tablet by mouth daily.        Marland Kitchen omeprazole (PRILOSEC OTC) 20 MG tablet Take 20 mg by mouth daily.       . simvastatin (ZOCOR) 10 MG tablet Take 1 tablet (10 mg total) by mouth daily.  90 tablet  3  . sitaGLIPtin (JANUVIA) 100 MG tablet Take 1 tablet (100 mg total) by mouth daily.  90 tablet  3  . hydroxyurea (HYDREA) 500 MG capsule Take 1 capsule (500 mg total) by mouth daily. May take with food to minimize GI side effects.  60 capsule  3   No current facility-administered medications for this visit.    ALLERGIES:  has No Known Allergies.  REVIEW OF SYSTEMS:  The rest of the 14-point review of system was negative.   Filed Vitals:   10/21/12 0920  BP: 147/68  Pulse: 84  Temp: 97.2 F (36.2 C)  Resp: 18   Wt Readings from Last 3 Encounters:  10/21/12 166 lb 11.2 oz (75.615 kg)  10/14/12 165 lb (74.844 kg)  09/20/12 164 lb 8 oz (74.617 kg)  ECOG Performance status:    PHYSICAL EXAMINATION:   General:  well-nourished woman, no acute distress.  Eyes:  no scleral icterus.  ENT:  There were no oropharyngeal lesions.  Neck was without thyromegaly.  Lymphatics:  Negative cervical, supraclavicular or axillary adenopathy.  Respiratory: lungs were clear bilaterally without wheezing or crackles.  Cardiovascular:  Regular rate and rhythm, S1/S2, without murmur, rub or gallop.  There was no pedal edema.  GI:  abdomen was soft, flat, nontender, nondistended, without organomegaly.  Muscoloskeletal:  no spinal tenderness of palpation of vertebral spine.  Skin exam was without echymosis, petichae.  Neuro exam was nonfocal.  Patient was able to get on and off exam table without assistance.  Gait was  normal.  Patient was alerted and oriented.  Attention was good.   Language was appropriate.  Mood was normal without depression.  Speech was not pressured.  Thought content was not tangential.      LABORATORY/RADIOLOGY DATA:  Lab Results  Component Value Date   WBC 6.3 10/14/2012   HGB 11.4* 10/14/2012   HCT 33.4* 10/14/2012   PLT 721* 10/14/2012   GLUCOSE 92 08/05/2012   CHOL 159 08/05/2012   TRIG 84.0 08/05/2012   HDL 56.60 08/05/2012   LDLCALC 86 08/05/2012   ALKPHOS 47 08/05/2012   ALT 18 08/05/2012   AST 19 08/05/2012   NA 137 08/05/2012   K 4.5 08/05/2012   CL 100 08/05/2012   CREATININE 0.6 08/05/2012   BUN 6 08/05/2012   CO2 26 08/05/2012   INR 0.93 10/14/2012   HGBA1C 7.7* 08/05/2012   MICROALBUR 0.4 08/05/2012     ASSESSMENT AND PLAN:   1.  Diagnosis:  ET (Essential thrombocythemia). 2.  Prognosis:  Normally a benign disease with the exception of blood clot.  There is also chance of transformation to myelofibrosis (low blood counts) or acute leukemia.  The risk is low. 3.  Recommendations:  *  Start Hydrea with goal to lower platelet count to decrease risk of blood clot.  Hydrea will be slowly titrated up.  Start at 500mg  PO daily x 1 week.  If she tolerates it well, we will increase it to 500mg  PO BID.   *  Continue Aspirin 81mg  by mouth once daily to decrease risk of blood clot.   Even though she is less than 15 year-old; and never had thrombosis; her thrombocythemia is significant.  I recommended starting Hydrea now to decrease risk of thrombosis.  Ms. Meinhart and her husband expressed informed understanding and wished to proceed.     4.  Follow up:    *  Weekly blood test while starting Hydrea.   *  Follow up in about 1 month.

## 2012-10-23 ENCOUNTER — Encounter: Payer: Self-pay | Admitting: Oncology

## 2012-10-23 ENCOUNTER — Other Ambulatory Visit: Payer: Self-pay | Admitting: Oncology

## 2012-10-24 ENCOUNTER — Telehealth: Payer: Self-pay | Admitting: *Deleted

## 2012-10-24 NOTE — Telephone Encounter (Signed)
Pt states unable to make lab appt on 6/23.  Will be out of town 6/22 to 6/28.  Has lab on 6/16 and asks if she should r/s the one on 6/23 to 6/20?  Instructed pt to wait until lab results on 10/28/12 and then Dr. Gaylyn Rong can decide if she needs them checked again before she goes out of town or can wait until she gets back.  Pt verbalized understanding and will wait for lab results on 6/16 for further instructions.

## 2012-10-28 ENCOUNTER — Other Ambulatory Visit (HOSPITAL_BASED_OUTPATIENT_CLINIC_OR_DEPARTMENT_OTHER): Payer: Managed Care, Other (non HMO) | Admitting: Lab

## 2012-10-28 ENCOUNTER — Telehealth: Payer: Self-pay | Admitting: *Deleted

## 2012-10-28 DIAGNOSIS — D473 Essential (hemorrhagic) thrombocythemia: Secondary | ICD-10-CM

## 2012-10-28 LAB — CBC WITH DIFFERENTIAL/PLATELET
BASO%: 0.8 % (ref 0.0–2.0)
EOS%: 2.2 % (ref 0.0–7.0)
HCT: 31.1 % — ABNORMAL LOW (ref 34.8–46.6)
LYMPH%: 33.6 % (ref 14.0–49.7)
MCH: 31.2 pg (ref 25.1–34.0)
MCHC: 35.1 g/dL (ref 31.5–36.0)
MCV: 88.8 fL (ref 79.5–101.0)
MONO#: 0.5 10*3/uL (ref 0.1–0.9)
MONO%: 7.5 % (ref 0.0–14.0)
NEUT%: 55.9 % (ref 38.4–76.8)
Platelets: 693 10*3/uL — ABNORMAL HIGH (ref 145–400)

## 2012-10-28 NOTE — Telephone Encounter (Signed)
Pt called for lab results and asks if any dose change to Hydrea?  States currently on 500 mg daily.   She is also going out of town next week from 6/22 thru 6/28,  Has lab scheduled on 6/23.  Can she have lab done when she gets back or should she r/s to this week Friday 6/ 20?

## 2012-10-28 NOTE — Telephone Encounter (Signed)
I did send her a Engineer, building services.  No change to her current dose of Hydrea.  Thanks.

## 2012-10-28 NOTE — Telephone Encounter (Signed)
Called pt and instructed to continue same dose of Hydrea per Dr. Gaylyn Rong.  Continue once daily and ok to have labs checked again when she returns from vacation per Dr. Gaylyn Rong.  She has lab scheduled on 6/30.  Will cancel lab on 6/23.  She verbalized understanding.

## 2012-11-04 ENCOUNTER — Other Ambulatory Visit: Payer: Managed Care, Other (non HMO) | Admitting: Lab

## 2012-11-11 ENCOUNTER — Telehealth: Payer: Self-pay | Admitting: *Deleted

## 2012-11-11 ENCOUNTER — Other Ambulatory Visit (HOSPITAL_BASED_OUTPATIENT_CLINIC_OR_DEPARTMENT_OTHER): Payer: Managed Care, Other (non HMO) | Admitting: Lab

## 2012-11-11 DIAGNOSIS — D473 Essential (hemorrhagic) thrombocythemia: Secondary | ICD-10-CM

## 2012-11-11 LAB — CBC WITH DIFFERENTIAL/PLATELET
BASO%: 0.5 % (ref 0.0–2.0)
EOS%: 0.1 % (ref 0.0–7.0)
HCT: 29.4 % — ABNORMAL LOW (ref 34.8–46.6)
MCH: 31.4 pg (ref 25.1–34.0)
MCHC: 35.3 g/dL (ref 31.5–36.0)
NEUT%: 76.8 % (ref 38.4–76.8)
RBC: 3.3 10*6/uL — ABNORMAL LOW (ref 3.70–5.45)
lymph#: 0.8 10*3/uL — ABNORMAL LOW (ref 0.9–3.3)

## 2012-11-11 NOTE — Telephone Encounter (Signed)
Pt called for lab results and to report she has been feeling "terrible" with joint aches, headaches and mouth soreness.  She is taking ibuprofen prn for the aches.  She is using Hydrogen peroxide rinse for her mouth sores.  Instructed pt ,per Dr. Gaylyn Rong, to decrease her Hydrea to one pill every other day and to keep next lab/office visit on 7/07 as scheduled.   She verbalized understanding.

## 2012-11-18 ENCOUNTER — Other Ambulatory Visit (HOSPITAL_BASED_OUTPATIENT_CLINIC_OR_DEPARTMENT_OTHER): Payer: Managed Care, Other (non HMO) | Admitting: Lab

## 2012-11-18 ENCOUNTER — Ambulatory Visit (HOSPITAL_BASED_OUTPATIENT_CLINIC_OR_DEPARTMENT_OTHER): Payer: Managed Care, Other (non HMO) | Admitting: Oncology

## 2012-11-18 ENCOUNTER — Telehealth: Payer: Self-pay | Admitting: Oncology

## 2012-11-18 VITALS — BP 136/65 | HR 83 | Temp 98.3°F | Resp 18 | Ht 64.0 in | Wt 165.1 lb

## 2012-11-18 DIAGNOSIS — D473 Essential (hemorrhagic) thrombocythemia: Secondary | ICD-10-CM

## 2012-11-18 LAB — COMPREHENSIVE METABOLIC PANEL (CC13)
ALT: 70 U/L — ABNORMAL HIGH (ref 0–55)
AST: 42 U/L — ABNORMAL HIGH (ref 5–34)
Albumin: 3.8 g/dL (ref 3.5–5.0)
Calcium: 10.8 mg/dL — ABNORMAL HIGH (ref 8.4–10.4)
Chloride: 100 mEq/L (ref 98–109)
Potassium: 5 mEq/L (ref 3.5–5.1)
Total Protein: 7.5 g/dL (ref 6.4–8.3)

## 2012-11-18 LAB — CBC WITH DIFFERENTIAL/PLATELET
BASO%: 0.6 % (ref 0.0–2.0)
Basophils Absolute: 0 10*3/uL (ref 0.0–0.1)
EOS%: 2.1 % (ref 0.0–7.0)
HGB: 11 g/dL — ABNORMAL LOW (ref 11.6–15.9)
MCH: 31.1 pg (ref 25.1–34.0)
MCHC: 34.9 g/dL (ref 31.5–36.0)
RDW: 13.9 % (ref 11.2–14.5)
lymph#: 2.2 10*3/uL (ref 0.9–3.3)

## 2012-11-18 NOTE — Patient Instructions (Addendum)
1.  Diagnosis:  Essential Thrombocytosis. 2.  Treatment:  Hydrea. 3.  Issue:  Lower platelet toward goal but also anemia. 4.  New dose:  Hydrea 500mg  PO twice daily on Mon/Wed/Fri.

## 2012-11-18 NOTE — Telephone Encounter (Signed)
gv and printed appt sched and avs for pt  °

## 2012-11-19 NOTE — Progress Notes (Signed)
[facility]  Telephone:([phone] Fax:([phone]   OFFICE PROGRESS NOTE   Cc:  Tillman Abide, MD  DIAGNOSIS:  JAK2-negative essential thrombocythemia (ET); BCR-ABL negative.   CURRENT THERAPY:  Started Hydrea in June 2014.   INTERVAL HISTORY: Savannah Evans 62 y.o. female returns for regular follow up by herself.  She reported that when she was on Hydrea 500mg  PO daily; she started having vague, constant headache.  He needed to take OTC analgesics such as NSAIDS and Tylenol to relieve the headache.  Headache was mild, diffuse, without visual changes.  She denied focal motor weakness, problem with motor strength or gait problem.  She also had mild fatigue.  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  These symptoms improved when she decrease the dose to 500mg  PO every other day per our instruction.    The rest of the 14-point review of system was negative.    Past Medical History  Diagnosis Date  . Allergic rhinitis   . Diabetes mellitus type 2, noninsulin dependent   . GERD (gastroesophageal reflux disease)   . Hyperlipidemia   . SVT (supraventricular tachycardia)   . Osteoarthritis   . Diaphragmatic hernia   . Iron deficiency   . Essential thrombocythemia 10/21/2012    JAK2 neg; BCR-ABL neg; bone marrow biopsy on 09/29/12 showed normal cytogenetics.     Past Surgical History  Procedure Laterality Date  . Svt ablation  2000    Dr. Graciela Husbands  . Colonoscopy      neg age 73.  Next age 55.     Current Outpatient Prescriptions  Medication Sig Dispense Refill  . Ascorbic Acid (VITAMIN C) 500 MG tablet Take 500 mg by mouth daily.        Marland Kitchen aspirin 81 MG tablet Take 81 mg by mouth daily.        . calcium-vitamin D (OSCAL) 250-125 MG-UNIT per tablet Take 1 tablet by mouth daily.        . cetirizine (ZYRTEC) 10 MG tablet Take 10 mg by mouth daily as needed for allergies.       . fish oil-omega-3 fatty acids 1000 MG capsule Take 1 g by mouth daily.        Marland Kitchen glipiZIDE (GLUCOTROL) 5 MG tablet Take 1 tablet (5 mg total) by mouth 2 (two) times daily before a meal.  180 tablet  3  . hydroxyurea (HYDREA) 500 MG capsule Take 500 mg by mouth every other day. May take with food to minimize GI side effects.      . metFORMIN (GLUCOPHAGE) 1000 MG tablet Take 1 tablet (1,000 mg total) by mouth 2 (two) times daily with a meal.  180 tablet  3  . Multiple Vitamin (MULTIVITAMIN) tablet Take 1 tablet by mouth daily.        Marland Kitchen omeprazole (PRILOSEC OTC) 20 MG tablet Take 20 mg by mouth daily.       . simvastatin (ZOCOR) 10 MG tablet Take 1 tablet (10 mg total) by mouth daily.  90 tablet  3  . sitaGLIPtin (JANUVIA) 100 MG tablet Take 1 tablet (100 mg total) by mouth daily.  90 tablet  3   No current facility-administered medications for this visit.    ALLERGIES:  has No Known Allergies.  REVIEW OF SYSTEMS:  The rest of the 14-point review of system was negative.   Filed Vitals:   11/18/12 0957  BP: 136/65  Pulse: 83  Temp: 98.3  F (36.8 C)  Resp: 18   Wt Readings from Last 3 Encounters:  11/18/12 165 lb 1.6 oz (74.889 kg)  10/21/12 166 lb 11.2 oz (75.615 kg)  10/14/12 165 lb (74.844 kg)   ECOG Performance status:  1  PHYSICAL EXAMINATION:   General:  well-nourished woman, no acute distress.  Eyes:  no scleral icterus.  ENT:  There were no oropharyngeal lesions.  Neck was without thyromegaly.  Lymphatics:  Negative cervical, supraclavicular or axillary adenopathy.  Respiratory: lungs were clear bilaterally without wheezing or crackles.  Cardiovascular:  Regular rate and rhythm, S1/S2, without murmur, rub or gallop.  There was no pedal edema.  GI:  abdomen was soft, flat, nontender, nondistended, without organomegaly.  Muscoloskeletal:  no spinal tenderness of palpation of vertebral spine.  Skin exam was without echymosis, petichae.  Neuro exam was nonfocal.  Patient was able to get on and off exam table without assistance.  Gait was normal.  Patient was  alerted and oriented.  Attention was good.   Language was appropriate.  Mood was normal without depression.  Speech was not pressured.  Thought content was not tangential.      LABORATORY/RADIOLOGY DATA:  Lab Results  Component Value Date   WBC 7.6 11/18/2012   HGB 11.0* 11/18/2012   HCT 31.6* 11/18/2012   PLT 628* 11/18/2012   GLUCOSE 124 11/18/2012   CHOL 159 08/05/2012   TRIG 84.0 08/05/2012   HDL 56.60 08/05/2012   LDLCALC 86 08/05/2012   ALKPHOS 68 11/18/2012   ALT 70* 11/18/2012   AST 42* 11/18/2012   NA 136 11/18/2012   K 5.0 11/18/2012   CL 100 08/05/2012   CREATININE 0.8 11/18/2012   BUN 7.9 11/18/2012   CO2 29 11/18/2012   INR 0.93 10/14/2012   HGBA1C 7.7* 08/05/2012   MICROALBUR 0.4 08/05/2012     ASSESSMENT AND PLAN:   1.  Diagnosis:  Essential thrombocythemia 2.  She has been on Hydrea with grade 2 headache and fatigue.  She has been on Hydrea 500mg  PO every other day.  Her platelet has significantly increased again on every other day.  I recommended her to take Hydrea 500mg  PO daily on Sunday through Thursday of every week and hold of on Friday and Saturday in order to improve her symptoms.   3.  Follow up:    *  q2wk CBC and adjust Hydrea dose if needed.   *  Return visit in about 4 months.     I informed [name] that I am leaving the service.  [facility] will [facility] for her to see another provider when she returns.

## 2012-11-21 ENCOUNTER — Encounter: Payer: Self-pay | Admitting: Oncology

## 2012-12-03 ENCOUNTER — Telehealth: Payer: Self-pay | Admitting: *Deleted

## 2012-12-03 ENCOUNTER — Other Ambulatory Visit (HOSPITAL_BASED_OUTPATIENT_CLINIC_OR_DEPARTMENT_OTHER): Payer: Managed Care, Other (non HMO)

## 2012-12-03 DIAGNOSIS — D473 Essential (hemorrhagic) thrombocythemia: Secondary | ICD-10-CM

## 2012-12-03 LAB — CBC WITH DIFFERENTIAL/PLATELET
BASO%: 0.9 % (ref 0.0–2.0)
EOS%: 1.5 % (ref 0.0–7.0)
MCH: 31.5 pg (ref 25.1–34.0)
MCHC: 34.4 g/dL (ref 31.5–36.0)
RDW: 15 % — ABNORMAL HIGH (ref 11.2–14.5)
lymph#: 2.3 10*3/uL (ref 0.9–3.3)

## 2012-12-03 NOTE — Telephone Encounter (Signed)
Per Dr Clelia Croft, pt to continue on current schedule of Hydrea. Pt notified

## 2012-12-03 NOTE — Telephone Encounter (Signed)
Pt called to get platelet count from 7/22 labs. Platelets are 573. She is currently taking Hydrea 500 mg daily Sunday through Thursday. She wants to know if she needs to continue this schedule or go back to taking Hydrea daily. She is not having the headache symptoms she was having at her last office visit with Dr Gaylyn Rong. Please advise.

## 2012-12-05 ENCOUNTER — Other Ambulatory Visit: Payer: Self-pay | Admitting: Gynecology

## 2012-12-24 ENCOUNTER — Other Ambulatory Visit (HOSPITAL_BASED_OUTPATIENT_CLINIC_OR_DEPARTMENT_OTHER): Payer: Managed Care, Other (non HMO) | Admitting: Lab

## 2012-12-24 DIAGNOSIS — D473 Essential (hemorrhagic) thrombocythemia: Secondary | ICD-10-CM

## 2012-12-24 LAB — CBC WITH DIFFERENTIAL/PLATELET
Basophils Absolute: 0.1 10*3/uL (ref 0.0–0.1)
EOS%: 1.6 % (ref 0.0–7.0)
HGB: 11 g/dL — ABNORMAL LOW (ref 11.6–15.9)
MCH: 32.3 pg (ref 25.1–34.0)
NEUT#: 3 10*3/uL (ref 1.5–6.5)
RDW: 16 % — ABNORMAL HIGH (ref 11.2–14.5)
lymph#: 2.4 10*3/uL (ref 0.9–3.3)

## 2012-12-25 ENCOUNTER — Telehealth: Payer: Self-pay

## 2012-12-25 DIAGNOSIS — D473 Essential (hemorrhagic) thrombocythemia: Secondary | ICD-10-CM

## 2012-12-25 NOTE — Telephone Encounter (Signed)
LM  for Ms Childers that her plt. count was up to 606 on 12-24-12. Clenton Pare NP said for her to increase her hydrea to 500 mg daily Sunday through Friday.  No Hydrea on Saturday.  Repeat cbc in 2 weeks~ 01-07-13. She can call back if she has any questions. Pof sent to schedulers to set up lab appointment.

## 2013-01-06 ENCOUNTER — Other Ambulatory Visit (HOSPITAL_BASED_OUTPATIENT_CLINIC_OR_DEPARTMENT_OTHER): Payer: Managed Care, Other (non HMO)

## 2013-01-06 ENCOUNTER — Telehealth: Payer: Self-pay | Admitting: *Deleted

## 2013-01-06 DIAGNOSIS — D473 Essential (hemorrhagic) thrombocythemia: Secondary | ICD-10-CM

## 2013-01-06 LAB — CBC WITH DIFFERENTIAL/PLATELET
BASO%: 0.8 % (ref 0.0–2.0)
Eosinophils Absolute: 0.1 10*3/uL (ref 0.0–0.5)
HCT: 30.5 % — ABNORMAL LOW (ref 34.8–46.6)
HGB: 10.6 g/dL — ABNORMAL LOW (ref 11.6–15.9)
MCHC: 34.7 g/dL (ref 31.5–36.0)
MONO#: 0.4 10*3/uL (ref 0.1–0.9)
NEUT#: 3.6 10*3/uL (ref 1.5–6.5)
NEUT%: 57.8 % (ref 38.4–76.8)
Platelets: 565 10*3/uL — ABNORMAL HIGH (ref 145–400)
WBC: 6.2 10*3/uL (ref 3.9–10.3)
lymph#: 2.1 10*3/uL (ref 0.9–3.3)

## 2013-01-06 NOTE — Telephone Encounter (Signed)
Message copied by Wende Mott on Mon Jan 06, 2013  5:04 PM ------      Message from: Clenton Pare R      Created: Mon Jan 06, 2013  4:13 PM       Please call pt. Plt are down. No change in Hydrea for now as I do not want to make her more anemic. Recheck labs in 2 weeks as scheduled. ------

## 2013-01-06 NOTE — Telephone Encounter (Signed)
Informed pt of Kristin's message below and platelet count from today.  Instructed to continue same dose of Hydrea.  She is currently taking 500 mg once daily every day.  States she has not been skipping a day as directed because her headaches are better.  Instructed to continue this dose daily and keep lab as scheduled in 2 weeks.  She verbalized understanding.

## 2013-01-06 NOTE — Telephone Encounter (Signed)
Pt left VM requesting results of her labwork from today.

## 2013-01-20 ENCOUNTER — Other Ambulatory Visit (HOSPITAL_BASED_OUTPATIENT_CLINIC_OR_DEPARTMENT_OTHER): Payer: Managed Care, Other (non HMO)

## 2013-01-20 ENCOUNTER — Telehealth: Payer: Self-pay

## 2013-01-20 DIAGNOSIS — D473 Essential (hemorrhagic) thrombocythemia: Secondary | ICD-10-CM

## 2013-01-20 LAB — CBC WITH DIFFERENTIAL/PLATELET
Basophils Absolute: 0 10*3/uL (ref 0.0–0.1)
EOS%: 1.7 % (ref 0.0–7.0)
HCT: 32.5 % — ABNORMAL LOW (ref 34.8–46.6)
HGB: 11.2 g/dL — ABNORMAL LOW (ref 11.6–15.9)
LYMPH%: 41 % (ref 14.0–49.7)
MCH: 32.9 pg (ref 25.1–34.0)
MONO#: 0.5 10*3/uL (ref 0.1–0.9)
NEUT%: 48.4 % (ref 38.4–76.8)
Platelets: 540 10*3/uL — ABNORMAL HIGH (ref 145–400)
lymph#: 2.2 10*3/uL (ref 0.9–3.3)

## 2013-01-20 NOTE — Telephone Encounter (Signed)
Message copied by Kallie Locks on Mon Jan 20, 2013  4:49 PM ------      Message from: Myrtis Ser      Created: Mon Jan 20, 2013  2:21 PM       Please call pt. Plt count is stable. Continue Hydrea without dose change. ------

## 2013-01-31 ENCOUNTER — Other Ambulatory Visit: Payer: Self-pay | Admitting: *Deleted

## 2013-01-31 DIAGNOSIS — D473 Essential (hemorrhagic) thrombocythemia: Secondary | ICD-10-CM

## 2013-02-03 ENCOUNTER — Other Ambulatory Visit (HOSPITAL_BASED_OUTPATIENT_CLINIC_OR_DEPARTMENT_OTHER): Payer: Managed Care, Other (non HMO) | Admitting: Lab

## 2013-02-03 DIAGNOSIS — D473 Essential (hemorrhagic) thrombocythemia: Secondary | ICD-10-CM

## 2013-02-03 LAB — CBC WITH DIFFERENTIAL/PLATELET
Basophils Absolute: 0.1 10*3/uL (ref 0.0–0.1)
EOS%: 1.9 % (ref 0.0–7.0)
HCT: 31.4 % — ABNORMAL LOW (ref 34.8–46.6)
HGB: 10.8 g/dL — ABNORMAL LOW (ref 11.6–15.9)
MCH: 33.4 pg (ref 25.1–34.0)
MCV: 96.9 fL (ref 79.5–101.0)
MONO%: 8.9 % (ref 0.0–14.0)
NEUT%: 49.3 % (ref 38.4–76.8)

## 2013-02-10 ENCOUNTER — Ambulatory Visit (INDEPENDENT_AMBULATORY_CARE_PROVIDER_SITE_OTHER): Payer: Managed Care, Other (non HMO) | Admitting: Internal Medicine

## 2013-02-10 ENCOUNTER — Encounter: Payer: Self-pay | Admitting: Internal Medicine

## 2013-02-10 VITALS — BP 120/80 | HR 80 | Temp 98.2°F | Ht 64.0 in | Wt 165.0 lb

## 2013-02-10 DIAGNOSIS — Z0000 Encounter for general adult medical examination without abnormal findings: Secondary | ICD-10-CM

## 2013-02-10 DIAGNOSIS — L570 Actinic keratosis: Secondary | ICD-10-CM

## 2013-02-10 DIAGNOSIS — E119 Type 2 diabetes mellitus without complications: Secondary | ICD-10-CM

## 2013-02-10 DIAGNOSIS — D473 Essential (hemorrhagic) thrombocythemia: Secondary | ICD-10-CM

## 2013-02-10 DIAGNOSIS — E785 Hyperlipidemia, unspecified: Secondary | ICD-10-CM

## 2013-02-10 NOTE — Assessment & Plan Note (Signed)
Ongoing care at cancer center

## 2013-02-10 NOTE — Assessment & Plan Note (Signed)
Hopefully still acceptable control 

## 2013-02-10 NOTE — Assessment & Plan Note (Signed)
On statin.

## 2013-02-10 NOTE — Assessment & Plan Note (Signed)
Chest and left forearm lesions treated with liquid nitrogen 45 seconds x 2 each Tolerated well Discussed home care

## 2013-02-10 NOTE — Progress Notes (Signed)
Subjective:    Patient ID: Savannah Evans, female    DOB: 1950/08/17, 62 y.o.   MRN: [id number]  HPI Here for physical  Diagnosis of essential thrombocythemia On hydroxyurea---has finally adjusted to taking this (headaches, etc at first)  No other acute issues  Trying to watch diet--never got a chance to go back to nutritionist but knows what she should be doing Checks sugars once a week Usually 130's fasting No low sugar reaction No sensory changes in feet or hands Not exercising much---felt fatigued on the hydrea-----now hopes to restart soon  Current Outpatient Prescriptions on File Prior to Visit  Medication Sig Dispense Refill  . Ascorbic Acid (VITAMIN C) 500 MG tablet Take 500 mg by mouth daily.        Marland Kitchen aspirin 81 MG tablet Take 81 mg by mouth daily.        . calcium-vitamin D (OSCAL) 250-125 MG-UNIT per tablet Take 1 tablet by mouth daily.        . cetirizine (ZYRTEC) 10 MG tablet Take 10 mg by mouth daily as needed for allergies.       . fish oil-omega-3 fatty acids 1000 MG capsule Take 1 g by mouth daily.       Marland Kitchen glipiZIDE (GLUCOTROL) 5 MG tablet Take 1 tablet (5 mg total) by mouth 2 (two) times daily before a meal.  180 tablet  3  . hydroxyurea (HYDREA) 500 MG capsule Take 500 mg by mouth daily. May take with food to minimize GI side effects.      . metFORMIN (GLUCOPHAGE) 1000 MG tablet Take 1 tablet (1,000 mg total) by mouth 2 (two) times daily with a meal.  180 tablet  3  . Multiple Vitamin (MULTIVITAMIN) tablet Take 1 tablet by mouth daily.        Marland Kitchen omeprazole (PRILOSEC OTC) 20 MG tablet Take 20 mg by mouth daily.       . simvastatin (ZOCOR) 10 MG tablet Take 1 tablet (10 mg total) by mouth daily.  90 tablet  3  . sitaGLIPtin (JANUVIA) 100 MG tablet Take 1 tablet (100 mg total) by mouth daily.  90 tablet  3   No current facility-administered medications on file prior to visit.    No Known Allergies  Past Medical History  Diagnosis Date  . Allergic rhinitis    . Diabetes mellitus type 2, noninsulin dependent   . GERD (gastroesophageal reflux disease)   . Hyperlipidemia   . SVT (supraventricular tachycardia)   . Osteoarthritis   . Diaphragmatic hernia   . Iron deficiency   . Essential thrombocythemia 10/21/2012    JAK2 neg; BCR-ABL neg; bone marrow biopsy on 09/29/12 showed normal cytogenetics.     Past Surgical History  Procedure Laterality Date  . Svt ablation  2000    Dr. Graciela Husbands  . Colonoscopy      neg age 83.  Next age 71.     Family History  Problem Relation Age of Onset  . Uterine cancer Sister   . Cancer Sister     uterine cancer  . Coronary artery disease    . Diabetes Sister   . Macular degeneration Mother   . Heart attack Father   . Cancer Paternal Aunt     cancer?  . Cancer Paternal Uncle     cancer?    History   Social History  . Marital Status: Married    Spouse Name: N/A    Number of Children: 3  . Years  of Education: N/A   Occupational History  . 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 Smoking status: Never Smoker   . Smokeless tobacco: Never Used  . Alcohol Use: 0.6 oz/week    1 Glasses of wine per week  . Drug Use: No  . Sexual Activity: Not on file   Other Topics Concern  . Not on file   Social History Narrative  . No narrative on file   Review of Systems  Constitutional: Negative for fatigue and unexpected weight change.       Wears seat belt  HENT: Negative for hearing loss, congestion, rhinorrhea, dental problem and tinnitus.        Regular with dentist  Eyes: Negative for visual disturbance.       No diplopia or unilateral vision loss  Respiratory: Negative for cough, chest tightness and shortness of breath.   Cardiovascular: Negative for chest pain, palpitations and leg swelling.  Gastrointestinal: Negative for nausea, vomiting, abdominal pain, constipation and blood in stool.       Heartburn is she eats late-- takes omeprazole if she  anticipates a problem. Tums for acute  Endocrine: Negative for cold intolerance and heat intolerance.  Genitourinary: Negative for dysuria, frequency and difficulty urinating.       No sexual problems  Musculoskeletal: Positive for arthralgias. Negative for back pain and joint swelling.       Hand pain at times--better now Trying to be careful on computer  Skin: Negative for rash.       Has scaly area on chest  Allergic/Immunologic: Positive for environmental allergies. Negative for immunocompromised state.       Uses OTC allergy pill---controls the symptoms  Neurological: Positive for headaches. Negative for dizziness, syncope, weakness, light-headedness and numbness.  Hematological: Negative for adenopathy. Does not bruise/bleed easily.  Psychiatric/Behavioral: Negative for sleep disturbance and dysphoric mood. The patient is not nervous/anxious.        Objective:   Physical Exam  Constitutional: She is oriented to person, place, and time. She appears well-developed and well-nourished. No distress.  HENT:  Head: Normocephalic and atraumatic.  Right Ear: External ear normal.  Left Ear: External ear normal.  Mouth/Throat: Oropharynx is clear and moist. No oropharyngeal exudate.  Eyes: Conjunctivae and EOM are normal. Pupils are equal, round, and reactive to light.  Neck: Normal range of motion. Neck supple. No thyromegaly present.  Cardiovascular: Normal rate, regular rhythm, normal heart sounds and intact distal pulses.  Exam reveals no gallop.   No murmur heard. Pulmonary/Chest: Effort normal and breath sounds normal. No respiratory distress. She has no wheezes. She has no rales.  Abdominal: Soft. There is no tenderness.  Musculoskeletal: She exhibits no edema and no tenderness.  Lymphadenopathy:    She has no cervical adenopathy.  Neurological: She is alert and oriented to person, place, and time.  Skin: No rash noted.  Small actinic on upper left chest Also one on left  forearm  Psychiatric: She has a normal mood and affect. Her behavior is normal.          Assessment & Plan:

## 2013-02-10 NOTE — Assessment & Plan Note (Signed)
Doing well Will get flu shot at cancer center

## 2013-02-17 ENCOUNTER — Telehealth: Payer: Self-pay | Admitting: *Deleted

## 2013-02-17 ENCOUNTER — Other Ambulatory Visit (HOSPITAL_BASED_OUTPATIENT_CLINIC_OR_DEPARTMENT_OTHER): Payer: Managed Care, Other (non HMO)

## 2013-02-17 DIAGNOSIS — D473 Essential (hemorrhagic) thrombocythemia: Secondary | ICD-10-CM

## 2013-02-17 LAB — CBC WITH DIFFERENTIAL/PLATELET
Basophils Absolute: 0.1 10*3/uL (ref 0.0–0.1)
Eosinophils Absolute: 0.1 10*3/uL (ref 0.0–0.5)
HCT: 33.3 % — ABNORMAL LOW (ref 34.8–46.6)
HGB: 11 g/dL — ABNORMAL LOW (ref 11.6–15.9)
LYMPH%: 39.1 % (ref 14.0–49.7)
MCV: 97.5 fL (ref 79.5–101.0)
MONO%: 9.1 % (ref 0.0–14.0)
NEUT#: 2.8 10*3/uL (ref 1.5–6.5)
Platelets: 568 10*3/uL — ABNORMAL HIGH (ref 145–400)

## 2013-02-17 LAB — COMPREHENSIVE METABOLIC PANEL (CC13)
Albumin: 3.7 g/dL (ref 3.5–5.0)
Alkaline Phosphatase: 45 U/L (ref 40–150)
BUN: 7.9 mg/dL (ref 7.0–26.0)
Glucose: 89 mg/dl (ref 70–140)
Potassium: 4.9 mEq/L (ref 3.5–5.1)

## 2013-02-17 NOTE — Telephone Encounter (Signed)
Pt requested results of CBC from today.  Informed her of lab results and instructed her to continue to take same dose of Hydrea (500 mg once daily) UNLESS she hears from nurse w/ different instructions.  Explained to pt need to have CBC reviewed by Dr. Bertis Ruddy for any new instructions.  Otherwise, keep taking same dose hydrea and keep next appt on 03/03/13 for lab and MD visit w/ Bertis Ruddy as scheduled.  She verbalized understanding.

## 2013-02-26 ENCOUNTER — Telehealth: Payer: Self-pay

## 2013-02-26 NOTE — Telephone Encounter (Signed)
In next 15 mins pt will fax to [phone] biometric screening form for completion; pt request cb when form completed. Pt had CPX on 02/10/13.

## 2013-02-26 NOTE — Telephone Encounter (Signed)
Form on your desk  

## 2013-02-26 NOTE — Telephone Encounter (Signed)
Form signed Molli Knock to send

## 2013-02-26 NOTE — Telephone Encounter (Signed)
Form faxed back.

## 2013-02-28 ENCOUNTER — Telehealth: Payer: Self-pay | Admitting: Hematology and Oncology

## 2013-02-28 ENCOUNTER — Other Ambulatory Visit: Payer: Self-pay | Admitting: Hematology and Oncology

## 2013-02-28 DIAGNOSIS — D473 Essential (hemorrhagic) thrombocythemia: Secondary | ICD-10-CM

## 2013-02-28 NOTE — Telephone Encounter (Signed)
S/w pt re change in appt times for 10/20. Pt aware lb/fu (NG) moved to 2:45pm.

## 2013-03-03 ENCOUNTER — Encounter: Payer: Self-pay | Admitting: Gastroenterology

## 2013-03-03 ENCOUNTER — Other Ambulatory Visit (HOSPITAL_BASED_OUTPATIENT_CLINIC_OR_DEPARTMENT_OTHER): Payer: Managed Care, Other (non HMO) | Admitting: Lab

## 2013-03-03 ENCOUNTER — Telehealth: Payer: Self-pay | Admitting: Hematology and Oncology

## 2013-03-03 ENCOUNTER — Ambulatory Visit (HOSPITAL_BASED_OUTPATIENT_CLINIC_OR_DEPARTMENT_OTHER): Payer: Managed Care, Other (non HMO) | Admitting: Hematology and Oncology

## 2013-03-03 ENCOUNTER — Encounter: Payer: Self-pay | Admitting: Hematology and Oncology

## 2013-03-03 VITALS — BP 135/75 | HR 103 | Temp 98.2°F | Resp 20 | Ht 64.0 in | Wt 164.3 lb

## 2013-03-03 DIAGNOSIS — D630 Anemia in neoplastic disease: Secondary | ICD-10-CM | POA: Insufficient documentation

## 2013-03-03 DIAGNOSIS — D473 Essential (hemorrhagic) thrombocythemia: Secondary | ICD-10-CM

## 2013-03-03 DIAGNOSIS — D649 Anemia, unspecified: Secondary | ICD-10-CM

## 2013-03-03 HISTORY — DX: Anemia, unspecified: D64.9

## 2013-03-03 LAB — COMPREHENSIVE METABOLIC PANEL (CC13)
AST: 16 U/L (ref 5–34)
Albumin: 4 g/dL (ref 3.5–5.0)
Alkaline Phosphatase: 51 U/L (ref 40–150)
BUN: 7.8 mg/dL (ref 7.0–26.0)
Creatinine: 0.7 mg/dL (ref 0.6–1.1)
Potassium: 4.4 mEq/L (ref 3.5–5.1)
Sodium: 140 mEq/L (ref 136–145)
Total Protein: 7.7 g/dL (ref 6.4–8.3)

## 2013-03-03 LAB — CBC WITH DIFFERENTIAL/PLATELET
Basophils Absolute: 0 10*3/uL (ref 0.0–0.1)
EOS%: 1.5 % (ref 0.0–7.0)
HGB: 11.2 g/dL — ABNORMAL LOW (ref 11.6–15.9)
MCH: 33.1 pg (ref 25.1–34.0)
MCV: 97.3 fL (ref 79.5–101.0)
MONO%: 6.9 % (ref 0.0–14.0)
NEUT%: 56.7 % (ref 38.4–76.8)
RBC: 3.38 10*6/uL — ABNORMAL LOW (ref 3.70–5.45)
RDW: 13.9 % (ref 11.2–14.5)

## 2013-03-03 NOTE — Telephone Encounter (Signed)
gv and printed appt sched and avs for pt for NOV.Marland Kitchensched pt with Dr. Christella Hartigan on 11.7.14 @ 10am

## 2013-03-03 NOTE — Progress Notes (Signed)
[facility] OFFICE PROGRESS NOTE  Patient Care Team: Karie Schwalbe, MD as PCP - General W Lodema Hong, MD as Attending Physician (Obstetrics and Gynecology) Exie Parody, MD (Hematology and Oncology)  DIAGNOSIS: Ree Kida 2 negative essential thrombocythemia, PCR/ABL negative  SUMMARY OF ONCOLOGIC HISTORY: A review of her records and collaborated the history with the patient. This patient used to be a blood donor. She was refused blood donation for several years due to anemia. A review of her CBC dated back to 2012 in note of mild anemia with associated thrombocytosis. In May of this year, she underwent extensive evaluation. I iron studies show that she had borderline iron deficiency. In June 2014, she had a bone marrow aspirate and biopsy. Results are inconclusive but there are presence of atypical megakaryocytes. Cytogenetics a study was normal. The patient was started on hydroxyurea.  INTERVAL HISTORY: SIMARA RHYNER 62 y.o. female returns for further followup. She denies any fatigue. No side effects from hydroxyurea apart from mild headaches. She denies any recent bleeding such as epistaxis, hematuria, or hematochezia. With headaches, she does take regular over-the-counter analgesics for this. Her last colonoscopy was 8 years ago which was reported as negative. She takes a variety of diet and denies any pica. She denies any muscle cramps in the legs are restless leg syndrome.  I have reviewed the past medical history, past surgical history, social history and family history with the patient and they are unchanged from previous note.  ALLERGIES:  has No Known Allergies.  MEDICATIONS:  Current Outpatient Prescriptions  Medication Sig Dispense Refill  . Ascorbic Acid (VITAMIN C) 500 MG tablet Take 500 mg by mouth daily.        Marland Kitchen aspirin 81 MG tablet Take 81 mg by mouth daily.        . calcium-vitamin D (OSCAL) 250-125 MG-UNIT per tablet Take 1 tablet by mouth daily.         . cetirizine (ZYRTEC) 10 MG tablet Take 10 mg by mouth daily as needed for allergies.       . fish oil-omega-3 fatty acids 1000 MG capsule Take 1 g by mouth daily.       Marland Kitchen glipiZIDE (GLUCOTROL) 5 MG tablet Take 1 tablet (5 mg total) by mouth 2 (two) times daily before a meal.  180 tablet  3  . hydroxyurea (HYDREA) 500 MG capsule Take 500 mg by mouth daily. May take with food to minimize GI side effects.      . metFORMIN (GLUCOPHAGE) 1000 MG tablet Take 1 tablet (1,000 mg total) by mouth 2 (two) times daily with a meal.  180 tablet  3  . Multiple Vitamin (MULTIVITAMIN) tablet Take 1 tablet by mouth daily.        Marland Kitchen omeprazole (PRILOSEC OTC) 20 MG tablet Take 20 mg by mouth daily.       . simvastatin (ZOCOR) 10 MG tablet Take 1 tablet (10 mg total) by mouth daily.  90 tablet  3  . sitaGLIPtin (JANUVIA) 100 MG tablet Take 1 tablet (100 mg total) by mouth daily.  90 tablet  3   No current facility-administered medications for this visit.    REVIEW OF SYSTEMS:   Constitutional: Denies fevers, chills or abnormal weight loss Eyes: Denies blurriness of vision Ears, nose, mouth, throat, and face: Denies mucositis or sore throat Respiratory: Denies cough, dyspnea or wheezes Cardiovascular: Denies palpitation, chest discomfort or lower extremity swelling Gastrointestinal:  Denies nausea, heartburn or change in  bowel habits Skin: Denies abnormal skin rashes Lymphatics: Denies new lymphadenopathy or easy bruising Neurological:Denies numbness, tingling or new weaknesses Behavioral/Psych: Mood is stable, no new changes  All other systems were reviewed with the patient and are negative.  PHYSICAL EXAMINATION: ECOG PERFORMANCE STATUS: 0 - Asymptomatic  Filed Vitals:   03/03/13 1449  BP: 135/75  Pulse: 103  Temp: 98.2 F (36.8 C)  Resp: 20   Filed Weights   03/03/13 1449  Weight: 164 lb 4.8 oz (74.526 kg)    GENERAL:alert, no distress and comfortable. She is mildly obese SKIN: skin color,  texture, turgor are normal, no rashes or significant lesions EYES: normal, Conjunctiva are pink and non-injected, sclera clear OROPHARYNX:no exudate, no erythema and lips, buccal mucosa, and tongue normal  NECK: supple, thyroid normal size, non-tender, without nodularity LYMPH:  no palpable lymphadenopathy in the cervical, axillary or inguinal LUNGS: clear to auscultation and percussion with normal breathing effort HEART: regular rate & rhythm and no murmurs and no lower extremity edema ABDOMEN:abdomen soft, non-tender and normal bowel sounds. No palpable splenomegaly Musculoskeletal:no cyanosis of digits and no clubbing  NEURO: alert & oriented x 3 with fluent speech, no focal motor/sensory deficits  LABORATORY DATA:  I have reviewed the data as listed    Component Value Date/Time   NA 138 02/17/2013 0811   NA 137 08/05/2012 1118   K 4.9 02/17/2013 0811   K 4.5 08/05/2012 1118   CL 100 08/05/2012 1118   CO2 27 02/17/2013 0811   CO2 26 08/05/2012 1118   GLUCOSE 89 02/17/2013 0811   GLUCOSE 92 08/05/2012 1118   BUN 7.9 02/17/2013 0811   BUN 6 08/05/2012 1118   CREATININE 0.7 02/17/2013 0811   CREATININE 0.6 08/05/2012 1118   CALCIUM 10.1 02/17/2013 0811   CALCIUM 9.7 08/05/2012 1118   PROT 7.2 02/17/2013 0811   PROT 7.7 08/05/2012 1118   ALBUMIN 3.7 02/17/2013 0811   ALBUMIN 4.4 08/05/2012 1118   AST 17 02/17/2013 0811   AST 19 08/05/2012 1118   ALT 14 02/17/2013 0811   ALT 18 08/05/2012 1118   ALKPHOS 45 02/17/2013 0811   ALKPHOS 47 08/05/2012 1118   BILITOT 0.37 02/17/2013 0811   BILITOT 0.3 08/05/2012 1118   GFRNONAA 112.68 10/29/2009 1221    No results found for this basename: SPEP, UPEP,  kappa and lambda light chains    Lab Results  Component Value Date   WBC 7.8 03/03/2013   NEUTROABS 4.4 03/03/2013   HGB 11.2* 03/03/2013   HCT 32.9* 03/03/2013   MCV 97.3 03/03/2013   PLT 565* 03/03/2013      Chemistry      Component Value Date/Time   NA 138 02/17/2013 0811   NA 137 08/05/2012  1118   K 4.9 02/17/2013 0811   K 4.5 08/05/2012 1118   CL 100 08/05/2012 1118   CO2 27 02/17/2013 0811   CO2 26 08/05/2012 1118   BUN 7.9 02/17/2013 0811   BUN 6 08/05/2012 1118   CREATININE 0.7 02/17/2013 0811   CREATININE 0.6 08/05/2012 1118      Component Value Date/Time   CALCIUM 10.1 02/17/2013 0811   CALCIUM 9.7 08/05/2012 1118   ALKPHOS 45 02/17/2013 0811   ALKPHOS 47 08/05/2012 1118   AST 17 02/17/2013 0811   AST 19 08/05/2012 1118   ALT 14 02/17/2013 0811   ALT 18 08/05/2012 1118   BILITOT 0.37 02/17/2013 0811   BILITOT 0.3 08/05/2012 1118     ASSESSMENT:  Essential thrombocytosis  PLAN:  #1 essential thrombocytosis I am concerned about the diagnosis. I would like to order an MPL mutation with the next visit. We will continue on hydroxyurea for now. Sometimes borderline iron deficiency can also cause pictures with anemia and thrombocytosis. Even though she was told she was not iron deficient, a TIBC was borderline high at along with the borderline low ferritin. At her age, with regular use of over-the-counter analgesics for headaches, I am concerned about possible GI bleed back across borderline iron deficiency but could be missed. I recommend a GI evaluation with EGD and colonoscopy to rule out GI bleed before we try iron replacement therapy. The patient is in agreement to proceed. #2 mild anemia This is likely due to recent treatment. The patient denies recent history of bleeding such as epistaxis, hematuria or hematochezia. She is asymptomatic from the anemia. I will observe for now.  she does not require transfusion now. I will continue the chemotherapy at current dose without dosage adjustment.  If the anemia gets progressive worse in the future, I might have to delay her treatment or adjust the chemotherapy dose.  Orders Placed This Encounter  Procedures  . [id number] mutation    Standing Status: Future     Number of Occurrences:      Standing Expiration Date: 03/03/2014  . CBC &  Diff and Retic    Standing Status: Future     Number of Occurrences:      Standing Expiration Date: 03/03/2014  . Ferritin    Standing Status: Future     Number of Occurrences:      Standing Expiration Date: 03/03/2014  . Haptoglobin    Standing Status: Future     Number of Occurrences:      Standing Expiration Date: 03/03/2014  . Iron and TIBC    Standing Status: Future     Number of Occurrences:      Standing Expiration Date: 03/03/2014  . Comprehensive metabolic panel    Standing Status: Future     Number of Occurrences:      Standing Expiration Date: 03/03/2014  . Ambulatory referral to Gastroenterology    Referral Priority:  Routine    Referral Type:  Consultation    Referral Reason:  Specialty Services Required    Requested Specialty:  Gastroenterology    Number of Visits Requested:  1   All questions were answered. The patient knows to call the clinic with any problems, questions or concerns. No barriers to learning was detected. I spent 25 minutes counseling the patient face to face. The total time spent in the appointment was 40 minutes and more than 50% was on counseling and review of test results     [facility], Dametrius Sanjuan, MD 03/03/2013 3:19 PM

## 2013-03-20 ENCOUNTER — Other Ambulatory Visit: Payer: Self-pay

## 2013-03-21 ENCOUNTER — Encounter: Payer: Self-pay | Admitting: Gastroenterology

## 2013-03-21 ENCOUNTER — Other Ambulatory Visit: Payer: Managed Care, Other (non HMO)

## 2013-03-21 ENCOUNTER — Ambulatory Visit (INDEPENDENT_AMBULATORY_CARE_PROVIDER_SITE_OTHER): Payer: Managed Care, Other (non HMO) | Admitting: Gastroenterology

## 2013-03-21 VITALS — BP 134/68 | HR 72 | Ht 64.0 in | Wt 166.2 lb

## 2013-03-21 DIAGNOSIS — D509 Iron deficiency anemia, unspecified: Secondary | ICD-10-CM

## 2013-03-21 MED ORDER — MOVIPREP 100 G PO SOLR: 1.0000 | Freq: Once | ORAL | Status: DC

## 2013-03-21 NOTE — Progress Notes (Signed)
Review of pertinent gastrointestinal problems: 1. routine risk for colon cancer : Colonoscopy September, 2005 with Dr. Laural Benes. This was done for routine screening. The examination was normal. No polyps or cancers were found.Marland Kitchen  HPI: This is a   very pleasant 62 year old woman whom I am meeting for the first time today.  Her hematologist has requested GI workup to make sure we aren't missing an occult lesion given her mild iron deficiency anemia. She says that iron deficiency anemia such as this can actually be the cause of her elevated platelets.  I reviewed her recent labs, her CBC was in the 11s, she was normocytic, her iron indices were slightly low  No changes in her bowels.  No straining.  No overt bleeding. Eats plenty of meta, iron containing products.  Takes PPI once per day, she didn't recall the name.  This controls very mild pyrosis.  Take ibuprofen periodically.  No mentstral cycle in 6-7 years.  Review of systems: Pertinent positive and negative review of systems were noted in the above HPI section. Complete review of systems was performed and was otherwise normal.    Past Medical History  Diagnosis Date  . Allergic rhinitis   . Diabetes mellitus type 2, noninsulin dependent   . GERD (gastroesophageal reflux disease)   . Hyperlipidemia   . SVT (supraventricular tachycardia)   . Osteoarthritis   . Diaphragmatic hernia   . Iron deficiency   . Essential thrombocythemia 10/21/2012    JAK2 neg; BCR-ABL neg; bone marrow biopsy on 09/29/12 showed normal cytogenetics.   . Anemia, unspecified 03/03/2013  . History of palpitations     Past Surgical History  Procedure Laterality Date  . Svt ablation  2000    Dr. Graciela Husbands  . Colonoscopy      neg age 25.  Next age 16.     Current Outpatient Prescriptions  Medication Sig Dispense Refill  . Ascorbic Acid (VITAMIN C) 500 MG tablet Take 500 mg by mouth daily.        Marland Kitchen aspirin 81 MG tablet Take 81 mg by mouth daily.        .  calcium-vitamin D (OSCAL) 250-125 MG-UNIT per tablet Take 1 tablet by mouth daily.        . cetirizine (ZYRTEC) 10 MG tablet Take 10 mg by mouth daily as needed for allergies.       . fish oil-omega-3 fatty acids 1000 MG capsule Take 1 g by mouth daily.       Marland Kitchen glipiZIDE (GLUCOTROL) 5 MG tablet Take 1 tablet (5 mg total) by mouth 2 (two) times daily before a meal.  180 tablet  3  . hydroxyurea (HYDREA) 500 MG capsule Take 500 mg by mouth daily. May take with food to minimize GI side effects.      . metFORMIN (GLUCOPHAGE) 1000 MG tablet Take 1 tablet (1,000 mg total) by mouth 2 (two) times daily with a meal.  180 tablet  3  . Multiple Vitamin (MULTIVITAMIN) tablet Take 1 tablet by mouth daily.        Marland Kitchen omeprazole (PRILOSEC OTC) 20 MG tablet Take 20 mg by mouth daily.       . simvastatin (ZOCOR) 10 MG tablet Take 1 tablet (10 mg total) by mouth daily.  90 tablet  3  . sitaGLIPtin (JANUVIA) 100 MG tablet Take 1 tablet (100 mg total) by mouth daily.  90 tablet  3   No current facility-administered medications for this visit.    Allergies  as of 03/21/2013  . (No Known Allergies)    Family History  Problem Relation Age of Onset  . Uterine cancer Sister   . Diabetes Sister   . Macular degeneration Mother   . Heart attack Father   . Cancer Paternal Aunt     cancer?  . Cancer Paternal Uncle     cancer?    History   Social History  . Marital Status: Married    Spouse Name: N/A    Number of Children: 3  . Years of Education: N/A   Occupational History  . 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 Smoking status: Never Smoker   . Smokeless tobacco: Never Used  . Alcohol Use: 0.6 oz/week    1 Glasses of wine per week     Comment: social  . Drug Use: No  . Sexual Activity: Not on file   Other Topics Concern  . Not on file   Social History Narrative  . No narrative on file       Physical Exam: Ht 5\' 4"  (1.626 m)  Wt 166 lb 4  oz (75.411 kg)  BMI 28.52 kg/m2 Constitutional: generally well-appearing Psychiatric: alert and oriented x3 Eyes: extraocular movements intact Mouth: oral pharynx moist, no lesions Neck: supple no lymphadenopathy Cardiovascular: heart regular rate and rhythm Lungs: clear to auscultation bilaterally Abdomen: soft, nontender, nondistended, no obvious ascites, no peritoneal signs, normal bowel sounds Extremities: no lower extremity edema bilaterally Skin: no lesions on visible extremities    Assessment and plan: 62 y.o. female with  mild iron deficiency anemia, elevated platelets  Her hematologist has asked that she have upper and lower endoscopy to check for cold lesions that could be causing mild iron deficiency anemia. Think it is certainly very reasonable idea. We will schedule that for her at her soonest convenience. I'm also going to have lab testing done to check for celiac sprue.

## 2013-03-21 NOTE — Patient Instructions (Addendum)
You will be set up for a colonoscopy for IDA (LEC, moderate sedation) You will be set up for an upper endoscopy for mild IDA You will have labs checked today in the basement lab.  Please head down after you check out with the front desk  (celiac panel). You may have a light breakfast the morning of prep day (the day before the procedure). You may choose from: eggs, toast, chicken noodle soup, crackers.  You should have your breakfast completed between 8:00 and 9:00 am.  Clear liquids only for the rest of the day on prep day and up until 3 hours before procedure.

## 2013-03-25 ENCOUNTER — Encounter: Payer: Self-pay | Admitting: Gastroenterology

## 2013-03-28 LAB — CELIAC PANEL 10
Gliadin IgA: 1.4 U/mL (ref <20)
Gliadin IgG: 2.2 U/mL (ref <20)
Tissue Transglutaminase Ab, IgA: 2.8 U/mL (ref <20)

## 2013-03-31 ENCOUNTER — Other Ambulatory Visit (HOSPITAL_BASED_OUTPATIENT_CLINIC_OR_DEPARTMENT_OTHER): Payer: Managed Care, Other (non HMO) | Admitting: Lab

## 2013-03-31 ENCOUNTER — Telehealth: Payer: Self-pay | Admitting: Hematology and Oncology

## 2013-03-31 ENCOUNTER — Ambulatory Visit (HOSPITAL_BASED_OUTPATIENT_CLINIC_OR_DEPARTMENT_OTHER): Payer: Managed Care, Other (non HMO) | Admitting: Hematology and Oncology

## 2013-03-31 ENCOUNTER — Telehealth: Payer: Self-pay | Admitting: *Deleted

## 2013-03-31 VITALS — BP 137/72 | HR 104 | Temp 98.6°F | Resp 19 | Ht 64.0 in | Wt 165.8 lb

## 2013-03-31 DIAGNOSIS — D473 Essential (hemorrhagic) thrombocythemia: Secondary | ICD-10-CM

## 2013-03-31 DIAGNOSIS — D649 Anemia, unspecified: Secondary | ICD-10-CM

## 2013-03-31 DIAGNOSIS — K5900 Constipation, unspecified: Secondary | ICD-10-CM | POA: Insufficient documentation

## 2013-03-31 LAB — IRON AND TIBC CHCC
%SAT: 20 % — ABNORMAL LOW (ref 21–57)
UIBC: 285 ug/dL (ref 120–384)

## 2013-03-31 LAB — CBC & DIFF AND RETIC
Eosinophils Absolute: 0.1 10*3/uL (ref 0.0–0.5)
HGB: 10.6 g/dL — ABNORMAL LOW (ref 11.6–15.9)
Immature Retic Fract: 8.8 % (ref 1.60–10.00)
MCHC: 34.1 g/dL (ref 31.5–36.0)
MCV: 97.5 fL (ref 79.5–101.0)
MONO#: 0.5 10*3/uL (ref 0.1–0.9)
MONO%: 7 % (ref 0.0–14.0)
NEUT#: 3.8 10*3/uL (ref 1.5–6.5)
RBC: 3.19 10*6/uL — ABNORMAL LOW (ref 3.70–5.45)
RDW: 12.9 % (ref 11.2–14.5)
Retic %: 1.77 % (ref 0.70–2.10)
Retic Ct Abs: 56.46 10*3/uL (ref 33.70–90.70)
WBC: 6.8 10*3/uL (ref 3.9–10.3)
lymph#: 2.3 10*3/uL (ref 0.9–3.3)

## 2013-03-31 LAB — COMPREHENSIVE METABOLIC PANEL (CC13)
ALT: 15 U/L (ref 0–55)
Anion Gap: 9 mEq/L (ref 3–11)
BUN: 8.1 mg/dL (ref 7.0–26.0)
CO2: 24 mEq/L (ref 22–29)
Calcium: 10.1 mg/dL (ref 8.4–10.4)
Chloride: 103 mEq/L (ref 98–109)
Creatinine: 0.7 mg/dL (ref 0.6–1.1)
Glucose: 178 mg/dl — ABNORMAL HIGH (ref 70–140)
Potassium: 4 mEq/L (ref 3.5–5.1)

## 2013-03-31 LAB — FERRITIN CHCC: Ferritin: 29 ng/ml (ref 9–269)

## 2013-03-31 LAB — HAPTOGLOBIN: Haptoglobin: 164 mg/dL (ref 45–215)

## 2013-03-31 MED ORDER — SENNA 8.6 MG PO CAPS: 1.0000 | ORAL_CAPSULE | Freq: Two times a day (BID) | ORAL | Status: DC

## 2013-03-31 MED ORDER — IRON 325 (65 FE) MG PO TABS: 1.0000 | ORAL_TABLET | Freq: Two times a day (BID) | ORAL | Status: DC

## 2013-03-31 NOTE — Telephone Encounter (Signed)
worked 11/17 POF Email MW to add inj on 12/30 after Tx AVS and CAL given to pt shh

## 2013-03-31 NOTE — Progress Notes (Signed)
[facility] OFFICE PROGRESS NOTE  Patient Care Team: Karie Schwalbe, MD as PCP - General W Lodema Hong, MD as Attending Physician (Obstetrics and Gynecology) Artis Delay, MD as Consulting Physician (Hematology and Oncology)  DIAGNOSIS: JAK 2 negative essential thrombocytosis, BCR/ABL negative  SUMMARY OF ONCOLOGIC HISTORY: A review of her records and collaborated the history with the patient. This patient used to be a blood donor. She was refused blood donation for several years due to anemia. A review of her CBC dated back to 2012 in note of mild anemia with associated thrombocytosis. In May of this year, she underwent extensive evaluation. I iron studies show that she had borderline iron deficiency. In June 2014, she had a bone marrow aspirate and biopsy. Results are inconclusive but there are presence of atypical megakaryocytes. Cytogenetics a study was normal. The patient was started on hydroxyurea.   INTERVAL HISTORY: Savannah Evans 62 y.o. female returns for further followup. The patient denies any recent signs or symptoms of bleeding such as spontaneous epistaxis, hematuria or hematochezia. She denies any chest pain dizziness or shortness of breath. She has an appointment to see the gastroenterologist next month for further evaluation. The patient remembers taking oral iron supplement in the past which caused some constipation.  I have reviewed the past medical history, past surgical history, social history and family history with the patient and they are unchanged from previous note.  ALLERGIES:  has No Known Allergies.  MEDICATIONS:  Current Outpatient Prescriptions  Medication Sig Dispense Refill  . Ascorbic Acid (VITAMIN C) 500 MG tablet Take 500 mg by mouth daily.        Marland Kitchen aspirin 81 MG tablet Take 81 mg by mouth daily.        . calcium-vitamin D (OSCAL) 250-125 MG-UNIT per tablet Take 1 tablet by mouth daily.        . cetirizine (ZYRTEC) 10 MG tablet  Take 10 mg by mouth daily as needed for allergies.       . fish oil-omega-3 fatty acids 1000 MG capsule Take 1 g by mouth daily.       Marland Kitchen glipiZIDE (GLUCOTROL) 5 MG tablet Take 1 tablet (5 mg total) by mouth 2 (two) times daily before a meal.  180 tablet  3  . hydroxyurea (HYDREA) 500 MG capsule Take 500 mg by mouth daily. May take with food to minimize GI side effects.      . metFORMIN (GLUCOPHAGE) 1000 MG tablet Take 1 tablet (1,000 mg total) by mouth 2 (two) times daily with a meal.  180 tablet  3  . Multiple Vitamin (MULTIVITAMIN) tablet Take 1 tablet by mouth daily.        Marland Kitchen omeprazole (PRILOSEC OTC) 20 MG tablet Take 20 mg by mouth daily.       . simvastatin (ZOCOR) 10 MG tablet Take 1 tablet (10 mg total) by mouth daily.  90 tablet  3  . sitaGLIPtin (JANUVIA) 100 MG tablet Take 1 tablet (100 mg total) by mouth daily.  90 tablet  3  . Ferrous Sulfate (IRON) 325 (65 FE) MG TABS Take 1 tablet by mouth 2 (two) times daily.  60 each  2  . Sennosides (SENNA) 8.6 MG CAPS Take 1 tablet by mouth 2 (two) times daily.  60 each  3   No current facility-administered medications for this visit.    REVIEW OF SYSTEMS:   Constitutional: Denies fevers, chills or abnormal weight loss All other systems were reviewed  with the patient and are negative.  PHYSICAL EXAMINATION: ECOG PERFORMANCE STATUS: 0 - Asymptomatic  Filed Vitals:   03/31/13 1431  BP: 137/72  Pulse: 104  Temp: 98.6 F (37 C)  Resp: 19   Filed Weights   03/31/13 1431  Weight: 165 lb 12.8 oz (75.206 kg)    GENERAL:alert, no distress and comfortable NEURO: alert & oriented x 3 with fluent speech, no focal motor/sensory deficits  LABORATORY DATA:  I have reviewed the data as listed    Component Value Date/Time   NA 140 03/03/2013 1438   NA 137 08/05/2012 1118   K 4.4 03/03/2013 1438   K 4.5 08/05/2012 1118   CL 100 08/05/2012 1118   CO2 26 03/03/2013 1438   CO2 26 08/05/2012 1118   GLUCOSE 146* 03/03/2013 1438   GLUCOSE 92  08/05/2012 1118   BUN 7.8 03/03/2013 1438   BUN 6 08/05/2012 1118   CREATININE 0.7 03/03/2013 1438   CREATININE 0.6 08/05/2012 1118   CALCIUM 10.1 03/03/2013 1438   CALCIUM 9.7 08/05/2012 1118   PROT 7.7 03/03/2013 1438   PROT 7.7 08/05/2012 1118   ALBUMIN 4.0 03/03/2013 1438   ALBUMIN 4.4 08/05/2012 1118   AST 16 03/03/2013 1438   AST 19 08/05/2012 1118   ALT 14 03/03/2013 1438   ALT 18 08/05/2012 1118   ALKPHOS 51 03/03/2013 1438   ALKPHOS 47 08/05/2012 1118   BILITOT 0.34 03/03/2013 1438   BILITOT 0.3 08/05/2012 1118   GFRNONAA 112.68 10/29/2009 1221    No results found for this basename: SPEP,  UPEP,   kappa and lambda light chains    Lab Results  Component Value Date   WBC 6.8 03/31/2013   NEUTROABS 3.8 03/31/2013   HGB 10.6* 03/31/2013   HCT 31.1* 03/31/2013   MCV 97.5 03/31/2013   PLT 534* 03/31/2013      Chemistry      Component Value Date/Time   NA 140 03/03/2013 1438   NA 137 08/05/2012 1118   K 4.4 03/03/2013 1438   K 4.5 08/05/2012 1118   CL 100 08/05/2012 1118   CO2 26 03/03/2013 1438   CO2 26 08/05/2012 1118   BUN 7.8 03/03/2013 1438   BUN 6 08/05/2012 1118   CREATININE 0.7 03/03/2013 1438   CREATININE 0.6 08/05/2012 1118      Component Value Date/Time   CALCIUM 10.1 03/03/2013 1438   CALCIUM 9.7 08/05/2012 1118   ALKPHOS 51 03/03/2013 1438   ALKPHOS 47 08/05/2012 1118   AST 16 03/03/2013 1438   AST 19 08/05/2012 1118   ALT 14 03/03/2013 1438   ALT 18 08/05/2012 1118   BILITOT 0.34 03/03/2013 1438   BILITOT 0.3 08/05/2012 1118      ASSESSMENT & PLAN:  #1 thrombocytosis I've order an additional workup to rule out myeloproliferative disorder. In the meantime she will continue on hydroxyurea. Sometimes iron deficiency can cause thrombocytosis. I recommend she start oral iron supplements. She wants to wait until after GI evaluation before trying intravenous iron. I agree. I also order some laxative for her in case she developed constipation with iron  supplements. If she cannot tolerate iron supplement, when she returned, we'll give her IV iron #2 borderline iron deficiency The patient is anemic could be due to hydroxyurea or iron deficiency. I recommend GI evaluation and she agreed. Appointment to see the gastroenterologist this next month. In the meantime she is asymptomatic. She does not require blood transfusion.  Orders Placed This Encounter  Procedures  . Ferritin    Standing Status: Future     Number of Occurrences:      Standing Expiration Date: 03/31/2014  . CBC & Diff and Retic    Standing Status: Future     Number of Occurrences:      Standing Expiration Date: 03/31/2014   All questions were answered. The patient knows to call the clinic with any problems, questions or concerns. No barriers to learning was detected. I spent 15 minutes counseling the patient face to face. The total time spent in the appointment was 20 minutes and more than 50% was on counseling and review of test results     [facility], Logan Vegh, MD 03/31/2013 3:02 PM

## 2013-03-31 NOTE — Telephone Encounter (Signed)
Per staff message and POF I have scheduled appts.  JMW  

## 2013-04-18 ENCOUNTER — Encounter: Payer: Self-pay | Admitting: Gastroenterology

## 2013-04-18 ENCOUNTER — Ambulatory Visit (AMBULATORY_SURGERY_CENTER): Payer: Managed Care, Other (non HMO) | Admitting: Gastroenterology

## 2013-04-18 VITALS — BP 122/79 | HR 88 | Temp 98.1°F | Resp 16 | Ht 64.0 in | Wt 166.0 lb

## 2013-04-18 DIAGNOSIS — D131 Benign neoplasm of stomach: Secondary | ICD-10-CM

## 2013-04-18 DIAGNOSIS — K2970 Gastritis, unspecified, without bleeding: Secondary | ICD-10-CM

## 2013-04-18 DIAGNOSIS — D509 Iron deficiency anemia, unspecified: Secondary | ICD-10-CM

## 2013-04-18 LAB — GLUCOSE, CAPILLARY: Glucose-Capillary: 96 mg/dL (ref 70–99)

## 2013-04-18 MED ORDER — SODIUM CHLORIDE 0.9 % IV SOLN: 500.0000 mL | INTRAVENOUS | Status: DC

## 2013-04-18 NOTE — Op Note (Signed)
[facility] [address] [place], [address]   ENDOSCOPY PROCEDURE REPORT  PATIENT: Savannah Evans, Savannah Evans  MR#: [id number] BIRTHDATE: May 08, 1951 , 62  yrs. old GENDER: Female ENDOSCOPIST: Rachael Fee, MD REFERRED BY:  Artis Delay, MD PROCEDURE DATE:  04/18/2013 PROCEDURE:  EGD w/ biopsy ASA CLASS:     Class III INDICATIONS:  Iron Deficiency Anemia. MEDICATIONS: Versed 3 mg IV, There was residual sedation effect present from prior procedure, and These medications were titrated to patient response per physician's verbal order TOPICAL ANESTHETIC: Cetacaine Spray  DESCRIPTION OF PROCEDURE: After the risks benefits and alternatives of the procedure were thoroughly explained, informed consent was obtained.  The [id number] endoscope was introduced through the mouth and advanced to the second portion of the duodenum. Without limitations.  The instrument was slowly withdrawn as the mucosa was fully examined.    There was mild, non-specific distal gastritis.  This was biopsied. The examination was otherwise normal.  Retroflexed views revealed no abnormalities.     The scope was then withdrawn from the patient and the procedure completed. COMPLICATIONS: There were no complications.  ENDOSCOPIC IMPRESSION: There was mild, non-specific distal gastritis.  This was biopsied. The examination was otherwise normal.  RECOMMENDATIONS: If biopsies show H.  pylori, you will be started on appropriate antibiotics.  If not, then my office will send you 2 sets of stool cards to be completed to check for microscopic blood in your stool.   eSigned:  Rachael Fee, MD 04/18/2013 3:23 PM

## 2013-04-18 NOTE — Patient Instructions (Signed)
YOU HAD AN ENDOSCOPIC PROCEDURE TODAY AT THE  ENDOSCOPY CENTER: Refer to the procedure report that was given to you for any specific questions about what was found during the examination.  If the procedure report does not answer your questions, please call your gastroenterologist to clarify.  If you requested that your care partner not be given the details of your procedure findings, then the procedure report has been included in a sealed envelope for you to review at your convenience later.  YOU SHOULD EXPECT: Some feelings of bloating in the abdomen. Passage of more gas than usual.  Walking can help get rid of the air that was put into your GI tract during the procedure and reduce the bloating. If you had a lower endoscopy (such as a colonoscopy or flexible sigmoidoscopy) you may notice spotting of blood in your stool or on the toilet paper. If you underwent a bowel prep for your procedure, then you may not have a normal bowel movement for a few days.  DIET: Your first meal following the procedure should be a light meal and then it is ok to progress to your normal diet.  A half-sandwich or bowl of soup is an example of a good first meal.  Heavy or fried foods are harder to digest and may make you feel nauseous or bloated.  Likewise meals heavy in dairy and vegetables can cause extra gas to form and this can also increase the bloating.  Drink plenty of fluids but you should avoid alcoholic beverages for 24 hours.  ACTIVITY: Your care partner should take you home directly after the procedure.  You should plan to take it easy, moving slowly for the rest of the day.  You can resume normal activity the day after the procedure however you should NOT DRIVE or use heavy machinery for 24 hours (because of the sedation medicines used during the test).    SYMPTOMS TO REPORT IMMEDIATELY: A gastroenterologist can be reached at any hour.  During normal business hours, 8:30 AM to 5:00 PM Monday through Friday,  call [phone].  After hours and on weekends, please call the GI answering service at [phone] who will take a message and have the physician on call contact you.   Following lower endoscopy (colonoscopy or flexible sigmoidoscopy):  Excessive amounts of blood in the stool  Significant tenderness or worsening of abdominal pains  Swelling of the abdomen that is new, acute  Fever of 100F or higher  Following upper endoscopy (EGD)  Vomiting of blood or coffee ground material  New chest pain or pain under the shoulder blades  Painful or persistently difficult swallowing  New shortness of breath  Fever of 100F or higher  Black, tarry-looking stools  FOLLOW UP: If any biopsies were taken you will be contacted by phone or by letter within the next 1-3 weeks.  Call your gastroenterologist if you have not heard about the biopsies in 3 weeks.  Our staff will call the home number listed on your records the next business day following your procedure to check on you and address any questions or concerns that you may have at that time regarding the information given to you following your procedure. This is a courtesy call and so if there is no answer at the home number and we have not heard from you through the emergency physician on call, we will assume that you have returned to your regular daily activities without incident.  SIGNATURES/CONFIDENTIALITY: You and/or your care   partner have signed paperwork which will be entered into your electronic medical record.  These signatures attest to the fact that that the information above on your After Visit Summary has been reviewed and is understood.  Full responsibility of the confidentiality of this discharge information lies with you and/or your care-partner.   Information on gastritis given to you today  Await biopsy results 

## 2013-04-18 NOTE — Op Note (Addendum)
[facility] [address] [place], [address]   COLONOSCOPY PROCEDURE REPORT  PATIENT: Savannah Evans, Savannah Evans  MR#: [id number] BIRTHDATE: [birth year] , 62  yrs. old GENDER: Female ENDOSCOPIST: Rachael Fee, MD REFERRED BY: Artis Delay, MD [facility]) PROCEDURE DATE:  04/18/2013 PROCEDURE:   Colonoscopy, diagnostic First Screening Colonoscopy - Avg.  risk and is 50 yrs.  old or older - No.  Prior Negative Screening - Now for repeat screening. N/A  History of Adenoma - Now for follow-up colonoscopy & has been > or = to 3 yrs.  N/A  Polyps Removed Today? No.  Recommend repeat exam, <10 yrs? No. ASA CLASS:   Class II INDICATIONS:Iron deficiency anemia. MEDICATIONS: Fentanyl 50 mcg IV, Versed 5 mg IV, and These medications were titrated to patient response per physician's verbal order  DESCRIPTION OF PROCEDURE:   After the risks benefits and alternatives of the procedure were thoroughly explained, informed consent was obtained.  A digital rectal exam revealed no abnormalities of the rectum.   The LB [id number]  endoscope was introduced through the anus and advanced to the cecum, which was identified by both the appendix and ileocecal valve. No adverse events experienced.   The quality of the prep was good.  The instrument was then slowly withdrawn as the colon was fully examined.   COLON FINDINGS: The mucosa appeared normal in the terminal ileum. A normal appearing cecum, ileocecal valve, and appendiceal orifice were identified.  The ascending, hepatic flexure, transverse, splenic flexure, descending, sigmoid colon and rectum appeared unremarkable.  No polyps or cancers were seen.  Retroflexed views revealed no abnormalities. The time to cecum=2 minutes 44 seconds. Withdrawal time=10 minutes 23 seconds.  The scope was withdrawn and the procedure completed. COMPLICATIONS: There were no complications.  ENDOSCOPIC IMPRESSION: 1.   Normal mucosa in the  terminal ileum 2.   Normal colon  RECOMMENDATIONS: You should continue to follow colorectal cancer screening guidelines for "routine risk" patients with a repeat colonoscopy in 10 years. There is no need for FOBT (stool) testing for at least 5 years. Will proceed with EGD now to continue workup for IDA   eSigned:  Rachael Fee, MD 04/18/2013 3:15 PM Revised: 04/18/2013 3:15 PM    PATIENT NAME:  Savannah Evans, Savannah Evans MR#: [id number]

## 2013-04-18 NOTE — Progress Notes (Signed)
Patient did not experience any of the following events: a burn prior to discharge; a fall within the facility; wrong site/side/patient/procedure/implant event; or a hospital transfer or hospital admission upon discharge from the facility. (G8907) Patient did not have preoperative order for IV antibiotic SSI prophylaxis. (G8918)  

## 2013-04-21 ENCOUNTER — Telehealth: Payer: Self-pay

## 2013-04-21 NOTE — Telephone Encounter (Signed)
Left message on answering machine. 

## 2013-04-23 ENCOUNTER — Other Ambulatory Visit: Payer: Self-pay

## 2013-04-23 DIAGNOSIS — R197 Diarrhea, unspecified: Secondary | ICD-10-CM

## 2013-04-23 DIAGNOSIS — D649 Anemia, unspecified: Secondary | ICD-10-CM

## 2013-04-25 ENCOUNTER — Telehealth: Payer: Self-pay | Admitting: *Deleted

## 2013-04-25 NOTE — Telephone Encounter (Signed)
Pt states she was sent some hemoccult stool cards in the mail.  States she assumes this means her H.pylori was negative on biopsy of gastritis? Informed pt H.pylori is negative per biopsy report and encouraged her to complete stool cards as instructed.  She states unsure of exact instructions for how often, how many times, to check stool?  Instructed pt to call [facility] for clarification on instructions.  She verbalized understanding.

## 2013-04-28 NOTE — Telephone Encounter (Signed)
Pt request form refaxed to [phone]. Advised pt done.

## 2013-05-06 ENCOUNTER — Other Ambulatory Visit (INDEPENDENT_AMBULATORY_CARE_PROVIDER_SITE_OTHER): Payer: Managed Care, Other (non HMO)

## 2013-05-06 DIAGNOSIS — D649 Anemia, unspecified: Secondary | ICD-10-CM

## 2013-05-06 LAB — HEMOCCULT SLIDES (X 3 CARDS)
Fecal Occult Blood: NEGATIVE
OCCULT 1: NEGATIVE
OCCULT 2: NEGATIVE
OCCULT 3: NEGATIVE
OCCULT 4: NEGATIVE
OCCULT 5: NEGATIVE

## 2013-05-13 ENCOUNTER — Ambulatory Visit (HOSPITAL_BASED_OUTPATIENT_CLINIC_OR_DEPARTMENT_OTHER): Payer: Managed Care, Other (non HMO) | Admitting: Hematology and Oncology

## 2013-05-13 ENCOUNTER — Telehealth: Payer: Self-pay | Admitting: *Deleted

## 2013-05-13 ENCOUNTER — Encounter: Payer: Self-pay | Admitting: Hematology and Oncology

## 2013-05-13 ENCOUNTER — Ambulatory Visit: Payer: Managed Care, Other (non HMO)

## 2013-05-13 ENCOUNTER — Other Ambulatory Visit (HOSPITAL_BASED_OUTPATIENT_CLINIC_OR_DEPARTMENT_OTHER): Payer: Managed Care, Other (non HMO)

## 2013-05-13 VITALS — BP 132/67 | HR 99 | Temp 98.7°F | Resp 20 | Ht 64.0 in | Wt 167.0 lb

## 2013-05-13 DIAGNOSIS — D509 Iron deficiency anemia, unspecified: Secondary | ICD-10-CM

## 2013-05-13 DIAGNOSIS — D473 Essential (hemorrhagic) thrombocythemia: Secondary | ICD-10-CM

## 2013-05-13 DIAGNOSIS — D649 Anemia, unspecified: Secondary | ICD-10-CM

## 2013-05-13 LAB — CBC & DIFF AND RETIC
BASO%: 0.7 % (ref 0.0–2.0)
Basophils Absolute: 0 10*3/uL (ref 0.0–0.1)
EOS%: 3.3 % (ref 0.0–7.0)
Eosinophils Absolute: 0.2 10*3/uL (ref 0.0–0.5)
HGB: 11.2 g/dL — ABNORMAL LOW (ref 11.6–15.9)
LYMPH%: 34.9 % (ref 14.0–49.7)
MCH: 33.3 pg (ref 25.1–34.0)
MCHC: 33.8 g/dL (ref 31.5–36.0)
MCV: 98.5 fL (ref 79.5–101.0)
MONO%: 9.2 % (ref 0.0–14.0)
Platelets: 536 10*3/uL — ABNORMAL HIGH (ref 145–400)
RBC: 3.36 10*6/uL — ABNORMAL LOW (ref 3.70–5.45)
RDW: 12.4 % (ref 11.2–14.5)
Retic %: 1.79 % (ref 0.70–2.10)
Retic Ct Abs: 60.14 10*3/uL (ref 33.70–90.70)
WBC: 6 10*3/uL (ref 3.9–10.3)
lymph#: 2.1 10*3/uL (ref 0.9–3.3)

## 2013-05-13 LAB — FERRITIN CHCC: Ferritin: 43 ng/ml (ref 9–269)

## 2013-05-13 NOTE — Telephone Encounter (Signed)
appts made and printed...td 

## 2013-05-13 NOTE — Progress Notes (Signed)
[facility] OFFICE PROGRESS NOTE  Patient Care Team: Karie Schwalbe, MD as PCP - General W Lodema Hong, MD as Attending Physician (Obstetrics and Gynecology) Artis Delay, MD as Consulting Physician (Hematology and Oncology)  DIAGNOSIS: Thrombocytosis, mild iron deficiency, question myeloproliferative disorder  SUMMARY OF ONCOLOGIC HISTORY: This patient used to be a blood donor. She was refused blood donation for several years due to anemia. A review of her CBC dated back to 2012 in note of mild anemia with associated thrombocytosis. In May of this year, she underwent extensive evaluation. Iron studies show that she had borderline iron deficiency. In June 2014, she had a bone marrow aspirate and biopsy. Results are inconclusive but there are presence of atypical megakaryocytes. Cytogenetics a study was normal. The patient was started on hydroxyurea. MPL mutation, BCR/ABL and JAK2 mutation testing was negative In December 2014, she had EGD and colonoscopy for evaluation of mine deficiency anemia and the tests were negative apart from mild nonspecific gastritis  INTERVAL HISTORY: Savannah Evans 62 y.o. female returns for further followup. She's able to tolerate oral iron supplement one tablet twice a day. She denies any abdominal bloating or constipation. The patient denies any recent signs or symptoms of bleeding such as spontaneous epistaxis, hematuria or hematochezia. I have reviewed the past medical history, past surgical history, social history and family history with the patient and they are unchanged from previous note.  ALLERGIES:  has No Known Allergies.  MEDICATIONS:  Current Outpatient Prescriptions  Medication Sig Dispense Refill  . Ascorbic Acid (VITAMIN C) 500 MG tablet Take 500 mg by mouth daily.        Marland Kitchen aspirin 81 MG tablet Take 81 mg by mouth daily.        . calcium-vitamin D (OSCAL) 250-125 MG-UNIT per tablet Take 1 tablet by mouth daily.        .  cetirizine (ZYRTEC) 10 MG tablet Take 10 mg by mouth daily as needed for allergies.       . Ferrous Sulfate (IRON) 325 (65 FE) MG TABS Take 1 tablet by mouth 2 (two) times daily.  60 each  2  . fish oil-omega-3 fatty acids 1000 MG capsule Take 1 g by mouth daily.       Marland Kitchen glipiZIDE (GLUCOTROL) 5 MG tablet Take 1 tablet (5 mg total) by mouth 2 (two) times daily before a meal.  180 tablet  3  . hydroxyurea (HYDREA) 500 MG capsule Take 500 mg by mouth daily. May take with food to minimize GI side effects.      . metFORMIN (GLUCOPHAGE) 1000 MG tablet Take 1 tablet (1,000 mg total) by mouth 2 (two) times daily with a meal.  180 tablet  3  . Multiple Vitamin (MULTIVITAMIN) tablet Take 1 tablet by mouth daily.        Marland Kitchen omeprazole (PRILOSEC OTC) 20 MG tablet Take 20 mg by mouth daily.       . Sennosides (SENNA) 8.6 MG CAPS Take 1 tablet by mouth 2 (two) times daily.  60 each  3  . simvastatin (ZOCOR) 10 MG tablet Take 1 tablet (10 mg total) by mouth daily.  90 tablet  3  . sitaGLIPtin (JANUVIA) 100 MG tablet Take 1 tablet (100 mg total) by mouth daily.  90 tablet  3   No current facility-administered medications for this visit.    REVIEW OF SYSTEMS:   Constitutional: Denies fevers, chills or abnormal weight loss ENeurological:Denies numbness, tingling or new  weaknesses Behavioral/Psych: Mood is stable, no new changes  All other systems were reviewed with the patient and are negative.  PHYSICAL EXAMINATION: ECOG PERFORMANCE STATUS: 0 - Asymptomatic  Filed Vitals:   05/13/13 1254  BP: 132/67  Pulse: 99  Temp: 98.7 F (37.1 C)  Resp: 20   Filed Weights   05/13/13 1254  Weight: 167 lb (75.751 kg)    GENERAL:alert, no distress and comfortable SKIN: skin color, texture, turgor are normal, no rashes or significant lesions EYES: normal, Conjunctiva are pink and non-injected, sclera clear Musculoskeletal:no cyanosis of digits and no clubbing  NEURO: alert & oriented x 3 with fluent speech, no  focal motor/sensory deficits  LABORATORY DATA:  I have reviewed the data as listed    Component Value Date/Time   NA 136 03/31/2013 1414   NA 137 08/05/2012 1118   K 4.0 03/31/2013 1414   K 4.5 08/05/2012 1118   CL 100 08/05/2012 1118   CO2 24 03/31/2013 1414   CO2 26 08/05/2012 1118   GLUCOSE 178* 03/31/2013 1414   GLUCOSE 92 08/05/2012 1118   BUN 8.1 03/31/2013 1414   BUN 6 08/05/2012 1118   CREATININE 0.7 03/31/2013 1414   CREATININE 0.6 08/05/2012 1118   CALCIUM 10.1 03/31/2013 1414   CALCIUM 9.7 08/05/2012 1118   PROT 7.2 03/31/2013 1414   PROT 7.7 08/05/2012 1118   ALBUMIN 3.9 03/31/2013 1414   ALBUMIN 4.4 08/05/2012 1118   AST 19 03/31/2013 1414   AST 19 08/05/2012 1118   ALT 15 03/31/2013 1414   ALT 18 08/05/2012 1118   ALKPHOS 55 03/31/2013 1414   ALKPHOS 47 08/05/2012 1118   BILITOT 0.28 03/31/2013 1414   BILITOT 0.3 08/05/2012 1118   GFRNONAA 112.68 10/29/2009 1221    No results found for this basename: SPEP, UPEP,  kappa and lambda light chains    Lab Results  Component Value Date   WBC 6.0 05/13/2013   NEUTROABS 3.1 05/13/2013   HGB 11.2* 05/13/2013   HCT 33.1* 05/13/2013   MCV 98.5 05/13/2013   PLT 536* 05/13/2013      Chemistry      Component Value Date/Time   NA 136 03/31/2013 1414   NA 137 08/05/2012 1118   K 4.0 03/31/2013 1414   K 4.5 08/05/2012 1118   CL 100 08/05/2012 1118   CO2 24 03/31/2013 1414   CO2 26 08/05/2012 1118   BUN 8.1 03/31/2013 1414   BUN 6 08/05/2012 1118   CREATININE 0.7 03/31/2013 1414   CREATININE 0.6 08/05/2012 1118      Component Value Date/Time   CALCIUM 10.1 03/31/2013 1414   CALCIUM 9.7 08/05/2012 1118   ALKPHOS 55 03/31/2013 1414   ALKPHOS 47 08/05/2012 1118   AST 19 03/31/2013 1414   AST 19 08/05/2012 1118   ALT 15 03/31/2013 1414   ALT 18 08/05/2012 1118   BILITOT 0.28 03/31/2013 1414   BILITOT 0.3 08/05/2012 1118     ASSESSMENT & PLAN:  #1 mild thrombocytosis The cause of thrombocytosis has a component of iron  deficiency. We waited for her iron studies to come back and since she took iron supplement consistently, her ferritin level is improving. I will cancel her IV iron therapy. I recommend she continue on oral iron supplement twice a day. Recent GI evaluation was negative. Her workup for myeloproliferative disorder appears to be inconclusive. At present I recommend she continue on hydroxyurea but I might discontinue to in the future if afterher iron storage  is adequately replenished, and if her thrombocytosis results, then it is unlikely that she has myeloproliferative disorder #2 iron deficiency She will continue iron supplement one tablet twice a day.  Orders Placed This Encounter  Procedures  . Ferritin    Standing Status: Future     Number of Occurrences:      Standing Expiration Date: 05/13/2014  . Iron and TIBC    Standing Status: Future     Number of Occurrences:      Standing Expiration Date: 05/13/2014  . CBC & Diff and Retic    Standing Status: Future     Number of Occurrences:      Standing Expiration Date: 05/13/2014   All questions were answered. The patient knows to call the clinic with any problems, questions or concerns. No barriers to learning was detected. I spent 15 minutes counseling the patient face to face. The total time spent in the appointment was 20 minutes and more than 50% was on counseling and review of test results     [facility], Demetries Coia, MD 05/13/2013 2:21 PM

## 2013-05-29 ENCOUNTER — Other Ambulatory Visit: Payer: Self-pay | Admitting: *Deleted

## 2013-05-29 MED ORDER — SITAGLIPTIN PHOSPHATE 100 MG PO TABS: 100.0000 mg | ORAL_TABLET | Freq: Every day | ORAL | Status: DC

## 2013-05-30 ENCOUNTER — Other Ambulatory Visit: Payer: Self-pay | Admitting: *Deleted

## 2013-06-12 ENCOUNTER — Ambulatory Visit (HOSPITAL_BASED_OUTPATIENT_CLINIC_OR_DEPARTMENT_OTHER): Payer: Managed Care, Other (non HMO) | Admitting: Hematology and Oncology

## 2013-06-12 ENCOUNTER — Telehealth: Payer: Self-pay | Admitting: *Deleted

## 2013-06-12 ENCOUNTER — Encounter: Payer: Self-pay | Admitting: Hematology and Oncology

## 2013-06-12 ENCOUNTER — Other Ambulatory Visit (HOSPITAL_BASED_OUTPATIENT_CLINIC_OR_DEPARTMENT_OTHER): Payer: Managed Care, Other (non HMO)

## 2013-06-12 VITALS — BP 129/66 | HR 98 | Temp 99.3°F | Resp 20 | Ht 64.0 in | Wt 166.2 lb

## 2013-06-12 DIAGNOSIS — D509 Iron deficiency anemia, unspecified: Secondary | ICD-10-CM

## 2013-06-12 DIAGNOSIS — D649 Anemia, unspecified: Secondary | ICD-10-CM

## 2013-06-12 DIAGNOSIS — D473 Essential (hemorrhagic) thrombocythemia: Secondary | ICD-10-CM

## 2013-06-12 LAB — CBC & DIFF AND RETIC
BASO%: 0.6 % (ref 0.0–2.0)
BASOS ABS: 0 10*3/uL (ref 0.0–0.1)
EOS ABS: 0.1 10*3/uL (ref 0.0–0.5)
EOS%: 1.9 % (ref 0.0–7.0)
HEMATOCRIT: 32.5 % — AB (ref 34.8–46.6)
HEMOGLOBIN: 10.9 g/dL — AB (ref 11.6–15.9)
IMMATURE RETIC FRACT: 10.6 % — AB (ref 1.60–10.00)
LYMPH#: 2.6 10*3/uL (ref 0.9–3.3)
LYMPH%: 41 % (ref 14.0–49.7)
MCH: 32.5 pg (ref 25.1–34.0)
MCHC: 33.5 g/dL (ref 31.5–36.0)
MCV: 97 fL (ref 79.5–101.0)
MONO#: 0.5 10*3/uL (ref 0.1–0.9)
MONO%: 7.8 % (ref 0.0–14.0)
NEUT#: 3.1 10*3/uL (ref 1.5–6.5)
NEUT%: 48.7 % (ref 38.4–76.8)
Platelets: 503 10*3/uL — ABNORMAL HIGH (ref 145–400)
RBC: 3.35 10*6/uL — ABNORMAL LOW (ref 3.70–5.45)
RDW: 12.3 % (ref 11.2–14.5)
RETIC %: 1.87 % (ref 0.70–2.10)
RETIC CT ABS: 62.65 10*3/uL (ref 33.70–90.70)
WBC: 6.4 10*3/uL (ref 3.9–10.3)
nRBC: 0 % (ref 0–0)

## 2013-06-12 LAB — IRON AND TIBC CHCC
%SAT: 14 % — AB (ref 21–57)
Iron: 50 ug/dL (ref 41–142)
TIBC: 349 ug/dL (ref 236–444)
UIBC: 299 ug/dL (ref 120–384)

## 2013-06-12 LAB — FERRITIN CHCC: FERRITIN: 42 ng/mL (ref 9–269)

## 2013-06-12 NOTE — Telephone Encounter (Signed)
Informed pt of Dr. Gorsuch's message below. She verbalized understanding.  

## 2013-06-12 NOTE — Progress Notes (Signed)
[facility] OFFICE PROGRESS NOTE  Viviana Simpler, MD DIAGNOSIS:  Mild anemia and chronic thrombocytosis, likely due to essential thrombocytosis  SUMMARY OF HEMATOLOGIC HISTORY: This patient used to be a blood donor. She was refused blood donation for several years due to anemia. A review of her CBC dated back to 2012 in note of mild anemia with associated thrombocytosis. In May of this year, she underwent extensive evaluation. Iron studies show that she had borderline iron deficiency. In June 2014, she had a bone marrow aspirate and biopsy. Results are inconclusive but there are presence of atypical megakaryocytes. Cytogenetics a study was normal. The patient was started on hydroxyurea. MPL mutation, BCR/ABL and JAK2 mutation testing was negative In December 2014, she had EGD and colonoscopy for evaluation of iron deficiency anemia and the tests were negative apart from mild nonspecific gastritis. She was placed on iron supplements INTERVAL HISTORY: JAMIRACLE AVANTS 63 y.o. female returns for further followup. She's able to tolerate 1 oral iron supplement twice a day. The patient denies any recent signs or symptoms of bleeding such as spontaneous epistaxis, hematuria or hematochezia.  I have reviewed the past medical history, past surgical history, social history and family history with the patient and they are unchanged from previous note.  ALLERGIES:  has No Known Allergies.  MEDICATIONS:  Current Outpatient Prescriptions  Medication Sig Dispense Refill  . Ascorbic Acid (VITAMIN C) 500 MG tablet Take 500 mg by mouth daily.        Marland Kitchen aspirin 81 MG tablet Take 81 mg by mouth daily.        . calcium-vitamin D (OSCAL) 250-125 MG-UNIT per tablet Take 1 tablet by mouth daily.        . cetirizine (ZYRTEC) 10 MG tablet Take 10 mg by mouth daily as needed for allergies.       . Ferrous Sulfate (IRON) 325 (65 FE) MG TABS Take 1 tablet by mouth 2 (two) times daily.  60 each  2  .  fish oil-omega-3 fatty acids 1000 MG capsule Take 1 g by mouth daily.       . hydroxyurea (HYDREA) 500 MG capsule Take 500 mg by mouth daily. May take with food to minimize GI side effects.      . metFORMIN (GLUCOPHAGE) 1000 MG tablet Take 1 tablet (1,000 mg total) by mouth 2 (two) times daily with a meal.  180 tablet  3  . Multiple Vitamin (MULTIVITAMIN) tablet Take 1 tablet by mouth daily.        Marland Kitchen omeprazole (PRILOSEC OTC) 20 MG tablet Take 20 mg by mouth daily.       . Sennosides (SENNA) 8.6 MG CAPS Take 1 tablet by mouth 2 (two) times daily.  60 each  3  . simvastatin (ZOCOR) 10 MG tablet Take 1 tablet (10 mg total) by mouth daily.  90 tablet  3  . sitaGLIPtin (JANUVIA) 100 MG tablet Take 1 tablet (100 mg total) by mouth daily.  90 tablet  3  . glipiZIDE (GLUCOTROL) 5 MG tablet Take 1 tablet (5 mg total) by mouth 2 (two) times daily before a meal.  180 tablet  3   No current facility-administered medications for this visit.     REVIEW OF SYSTEMS:   Constitutional: Denies fevers, chills or night sweats Behavioral/Psych: Mood is stable, no new changes  All other systems were reviewed with the patient and are negative.  PHYSICAL EXAMINATION: ECOG PERFORMANCE STATUS: 0 - Asymptomatic  Filed Vitals:  06/12/13 1352  BP: 129/66  Pulse: 98  Temp: 99.3 F (37.4 C)  Resp: 20   Filed Weights   06/12/13 1352  Weight: 166 lb 3.2 oz (75.388 kg)    GENERAL:alert, no distress and comfortable Musculoskeletal:no cyanosis of digits and no clubbing  NEURO: alert & oriented x 3 with fluent speech, no focal motor/sensory deficits  LABORATORY DATA:  I have reviewed the data as listed Results for orders placed in visit on 06/12/13 (from the past 48 hour(s))  IRON AND TIBC [facility]     Status: Abnormal   Collection Time    06/12/13  1:29 PM      Result Value Range   Iron 50  41 - 142 ug/dL   TIBC 349  236 - 444 ug/dL   UIBC 299  120 - 384 ug/dL   %SAT 14 (*) 21 - 57 %  CBC & DIFF AND RETIC      Status: Abnormal   Collection Time    06/12/13  1:29 PM      Result Value Range   WBC 6.4  3.9 - 10.3 10e3/uL   NEUT# 3.1  1.5 - 6.5 10e3/uL   HGB 10.9 (*) 11.6 - 15.9 g/dL   HCT 32.5 (*) 34.8 - 46.6 %   Platelets 503 (*) 145 - 400 10e3/uL   MCV 97.0  79.5 - 101.0 fL   MCH 32.5  25.1 - 34.0 pg   MCHC 33.5  31.5 - 36.0 g/dL   RBC 3.35 (*) 3.70 - 5.45 10e6/uL   RDW 12.3  11.2 - 14.5 %   lymph# 2.6  0.9 - 3.3 10e3/uL   MONO# 0.5  0.1 - 0.9 10e3/uL   Eosinophils Absolute 0.1  0.0 - 0.5 10e3/uL   Basophils Absolute 0.0  0.0 - 0.1 10e3/uL   NEUT% 48.7  38.4 - 76.8 %   LYMPH% 41.0  14.0 - 49.7 %   MONO% 7.8  0.0 - 14.0 %   EOS% 1.9  0.0 - 7.0 %   BASO% 0.6  0.0 - 2.0 %   nRBC 0  0 - 0 %   Retic % 1.87  0.70 - 2.10 %   Retic Ct Abs 62.65  33.70 - 90.70 10e3/uL   Immature Retic Fract 10.60 (*) 1.60 - 10.00 %  FERRITIN [facility]     Status: None   Collection Time    06/12/13  1:29 PM      Result Value Range   Ferritin 42  9 - 269 ng/ml    Lab Results  Component Value Date   WBC 6.4 06/12/2013   HGB 10.9* 06/12/2013   HCT 32.5* 06/12/2013   MCV 97.0 06/12/2013   PLT 503* 06/12/2013   ASSESSMENT & PLAN:  #1 mild thrombocytosis The cause of thrombocytosis is likely due to myeloproliferative disorder. Oral iron supplement did not help with her thrombocytosis. Her prior bone marrow biopsy was inconclusive but overall presentation is highly suspicious for myeloproliferative disorder. I recommend she continue on hydroxyurea once a day. I do not want to increase the dose of hydroxyurea for fear that it may cause worsening anemia. The patient agreed to continue on current dose. #2 iron deficiency Iron supplement did not make her ferritin goes higher. I recommend she stop and we'll monitor again in the next visit #3 anemia This is likely anemia of chronic disease. The patient denies recent history of bleeding such as epistaxis, hematuria or hematochezia. She is asymptomatic from the anemia.  We  will observe for now.  She does not require transfusion now.   All questions were answered. The patient knows to call the clinic with any problems, questions or concerns. No barriers to learning was detected.  I spent 15 minutes counseling the patient face to face. The total time spent in the appointment was 20 minutes and more than 50% was on counseling.     [facility], [place], MD 06/12/2013 2:45 PM

## 2013-06-12 NOTE — Telephone Encounter (Signed)
Message copied by Cathlean Cower on Thu Jun 12, 2013  3:39 PM ------      Message from: [facility], [place]: Thu Jun 12, 2013  2:42 PM      Regarding: ferritin level       Unchanged, same as before      Stop iron Rx once her current bottle is finished as discussed ------

## 2013-06-13 ENCOUNTER — Telehealth: Payer: Self-pay | Admitting: Hematology and Oncology

## 2013-06-13 NOTE — Telephone Encounter (Signed)
lvm for pt regarding to April appt....mailed pt appt sched/avs and letter °

## 2013-06-18 ENCOUNTER — Other Ambulatory Visit: Payer: Self-pay | Admitting: *Deleted

## 2013-06-18 MED ORDER — GLIPIZIDE 5 MG PO TABS: 5.0000 mg | ORAL_TABLET | Freq: Two times a day (BID) | ORAL | Status: DC

## 2013-07-07 ENCOUNTER — Other Ambulatory Visit: Payer: Self-pay

## 2013-07-07 MED ORDER — METFORMIN HCL 1000 MG PO TABS: 1000.0000 mg | ORAL_TABLET | Freq: Two times a day (BID) | ORAL | Status: DC

## 2013-07-07 NOTE — Telephone Encounter (Signed)
Pt request refill metformin to [facility]; pt has appt 07/2013. Pt request cb when done. Pt notified done.

## 2013-07-22 ENCOUNTER — Other Ambulatory Visit: Payer: Self-pay

## 2013-07-22 MED ORDER — SIMVASTATIN 10 MG PO TABS: 10.0000 mg | ORAL_TABLET | Freq: Every day | ORAL | Status: DC

## 2013-07-22 NOTE — Telephone Encounter (Signed)
Pt left v/m requesting refill simvastatin to [facility]; pt does not need cb. Refill done.

## 2013-08-11 ENCOUNTER — Ambulatory Visit (INDEPENDENT_AMBULATORY_CARE_PROVIDER_SITE_OTHER): Payer: Managed Care, Other (non HMO) | Admitting: Internal Medicine

## 2013-08-11 ENCOUNTER — Encounter: Payer: Self-pay | Admitting: Internal Medicine

## 2013-08-11 VITALS — BP 118/80 | HR 82 | Temp 98.5°F | Wt 165.0 lb

## 2013-08-11 DIAGNOSIS — D473 Essential (hemorrhagic) thrombocythemia: Secondary | ICD-10-CM

## 2013-08-11 DIAGNOSIS — E785 Hyperlipidemia, unspecified: Secondary | ICD-10-CM

## 2013-08-11 DIAGNOSIS — E119 Type 2 diabetes mellitus without complications: Secondary | ICD-10-CM

## 2013-08-11 DIAGNOSIS — K219 Gastro-esophageal reflux disease without esophagitis: Secondary | ICD-10-CM

## 2013-08-11 LAB — LIPID PANEL
CHOLESTEROL: 135 mg/dL (ref 0–200)
HDL: 53.5 mg/dL (ref >39.00)
LDL Cholesterol: 54 mg/dL (ref 0–99)
TRIGLYCERIDES: 138 mg/dL (ref 0.0–149.0)
Total CHOL/HDL Ratio: 3
VLDL: 27.6 mg/dL (ref 0.0–40.0)

## 2013-08-11 LAB — T4, FREE: Free T4: 0.81 ng/dL (ref 0.60–1.60)

## 2013-08-11 LAB — TSH: TSH: 0.85 u[IU]/mL (ref 0.35–5.50)

## 2013-08-11 LAB — HEMOGLOBIN A1C: Hgb A1c MFr Bld: 8.1 % — ABNORMAL HIGH (ref 4.6–6.5)

## 2013-08-11 NOTE — Assessment & Plan Note (Signed)
Still seems to have good control Will check labs 

## 2013-08-11 NOTE — Progress Notes (Signed)
Subjective:    Patient ID: Savannah Evans, female    DOB: Jun 10, 1950, 63 y.o.   MRN: [id number]  HPI Doing well  Concerned about the price of the [place] Checks weekly-- 115-150 No hypoglycemic reactions  Feels she is adjusted to the hydroxyurea Now off the iron  Still on simvastatin No muscle aches unless she is doing hard work--remodeling her kitchen  Stomach is fine Uses the prilosec every day No swallowing problems  Current Outpatient Prescriptions on File Prior to Visit  Medication Sig Dispense Refill  . Ascorbic Acid (VITAMIN C) 500 MG tablet Take 500 mg by mouth daily.        Marland Kitchen aspirin 81 MG tablet Take 81 mg by mouth daily.        . calcium-vitamin D (OSCAL) 250-125 MG-UNIT per tablet Take 1 tablet by mouth daily.        . cetirizine (ZYRTEC) 10 MG tablet Take 10 mg by mouth daily as needed for allergies.       . fish oil-omega-3 fatty acids 1000 MG capsule Take 1 g by mouth daily.       Marland Kitchen glipiZIDE (GLUCOTROL) 5 MG tablet Take 1 tablet (5 mg total) by mouth 2 (two) times daily before a meal.  180 tablet  3  . hydroxyurea (HYDREA) 500 MG capsule Take 500 mg by mouth daily. May take with food to minimize GI side effects.      . metFORMIN (GLUCOPHAGE) 1000 MG tablet Take 1 tablet (1,000 mg total) by mouth 2 (two) times daily with a meal.  180 tablet  1  . Multiple Vitamin (MULTIVITAMIN) tablet Take 1 tablet by mouth daily.        Marland Kitchen omeprazole (PRILOSEC OTC) 20 MG tablet Take 20 mg by mouth daily.       . simvastatin (ZOCOR) 10 MG tablet Take 1 tablet (10 mg total) by mouth daily.  90 tablet  1  . sitaGLIPtin (JANUVIA) 100 MG tablet Take 1 tablet (100 mg total) by mouth daily.  90 tablet  3   No current facility-administered medications on file prior to visit.    No Known Allergies  Past Medical History  Diagnosis Date  . Allergic rhinitis   . Diabetes mellitus type 2, noninsulin dependent   . GERD (gastroesophageal reflux disease)   . Hyperlipidemia   . SVT  (supraventricular tachycardia)   . Osteoarthritis   . Diaphragmatic hernia   . Iron deficiency   . Essential thrombocythemia 10/21/2012    JAK2 neg; BCR-ABL neg; bone marrow biopsy on 09/29/12 showed normal cytogenetics.   . Anemia, unspecified 03/03/2013  . History of palpitations     Past Surgical History  Procedure Laterality Date  . Svt ablation  2000    Dr. Caryl Comes  . Colonoscopy      neg age 49.  Next age 74.     Family History  Problem Relation Age of Onset  . Uterine cancer Sister   . Diabetes Sister   . Macular degeneration Mother   . Heart attack Father   . Cancer Paternal Aunt     cancer?  . Cancer Paternal Uncle     cancer?  . Colon cancer Neg Hx     History   Social History  . Marital Status: Married    Spouse Name: N/A    Number of Children: 3  . Years of Education: N/A   Occupational History  . 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 Smoking status: Never Smoker   . Smokeless tobacco: Never Used  . Alcohol Use: 0.6 oz/week    1 Glasses of wine per week     Comment: social  . Drug Use: No  . Sexual Activity: Not on file   Other Topics Concern  . Not on file   Social History Narrative  . No narrative on file   Review of Systems Appetite is fine Weight is stable Sleeps well    Objective:   Physical Exam  Constitutional: She appears well-developed and well-nourished. No distress.  Neck: Normal range of motion. Neck supple. No thyromegaly present.  Cardiovascular: Normal rate, regular rhythm, normal heart sounds and intact distal pulses.  Exam reveals no gallop.   No murmur heard. Pulmonary/Chest: Effort normal and breath sounds normal. No respiratory distress. She has no wheezes. She has no rales.  Musculoskeletal: She exhibits no edema and no tenderness.  Lymphadenopathy:    She has no cervical adenopathy.  Psychiatric: She has a normal mood and affect. Her behavior is normal.            Assessment & Plan:

## 2013-08-11 NOTE — Assessment & Plan Note (Signed)
Controlled with the PPI 

## 2013-08-11 NOTE — Assessment & Plan Note (Signed)
Feels better with the hydrea now (no fatigue, etc) Off the iron--bowels are better

## 2013-08-11 NOTE — Assessment & Plan Note (Signed)
No problems with statin Due for labs 

## 2013-08-11 NOTE — Progress Notes (Signed)
Pre visit review using our clinic review tool, if applicable. No additional management support is needed unless otherwise documented below in the visit note. 

## 2013-08-19 ENCOUNTER — Telehealth: Payer: Self-pay | Admitting: Hematology and Oncology

## 2013-08-19 NOTE — Telephone Encounter (Signed)
per NG appt chgd to 4/27-cld pt and spoke w/pt to adv of new time & date

## 2013-08-21 ENCOUNTER — Telehealth: Payer: Self-pay

## 2013-08-21 NOTE — Telephone Encounter (Signed)
Relevant patient education assigned to patient using Emmi. ° °

## 2013-09-08 ENCOUNTER — Telehealth: Payer: Self-pay | Admitting: Hematology and Oncology

## 2013-09-08 ENCOUNTER — Ambulatory Visit (HOSPITAL_BASED_OUTPATIENT_CLINIC_OR_DEPARTMENT_OTHER): Payer: Managed Care, Other (non HMO) | Admitting: Hematology and Oncology

## 2013-09-08 ENCOUNTER — Encounter: Payer: Self-pay | Admitting: Hematology and Oncology

## 2013-09-08 ENCOUNTER — Other Ambulatory Visit (HOSPITAL_BASED_OUTPATIENT_CLINIC_OR_DEPARTMENT_OTHER): Payer: Managed Care, Other (non HMO)

## 2013-09-08 VITALS — BP 129/64 | HR 99 | Temp 98.3°F | Resp 18 | Ht 64.0 in | Wt 163.6 lb

## 2013-09-08 DIAGNOSIS — D473 Essential (hemorrhagic) thrombocythemia: Secondary | ICD-10-CM

## 2013-09-08 DIAGNOSIS — D649 Anemia, unspecified: Secondary | ICD-10-CM

## 2013-09-08 DIAGNOSIS — D509 Iron deficiency anemia, unspecified: Secondary | ICD-10-CM

## 2013-09-08 LAB — CBC & DIFF AND RETIC
BASO%: 0.4 % (ref 0.0–2.0)
Basophils Absolute: 0 10*3/uL (ref 0.0–0.1)
EOS%: 1.3 % (ref 0.0–7.0)
Eosinophils Absolute: 0.1 10*3/uL (ref 0.0–0.5)
HEMATOCRIT: 32.7 % — AB (ref 34.8–46.6)
HGB: 11.1 g/dL — ABNORMAL LOW (ref 11.6–15.9)
Immature Retic Fract: 10.6 % — ABNORMAL HIGH (ref 1.60–10.00)
LYMPH%: 36.9 % (ref 14.0–49.7)
MCH: 33.2 pg (ref 25.1–34.0)
MCHC: 33.9 g/dL (ref 31.5–36.0)
MCV: 97.9 fL (ref 79.5–101.0)
MONO#: 0.5 10*3/uL (ref 0.1–0.9)
MONO%: 7.7 % (ref 0.0–14.0)
NEUT#: 3.6 10*3/uL (ref 1.5–6.5)
NEUT%: 53.7 % (ref 38.4–76.8)
PLATELETS: 501 10*3/uL — AB (ref 145–400)
RBC: 3.34 10*6/uL — ABNORMAL LOW (ref 3.70–5.45)
RDW: 12.7 % (ref 11.2–14.5)
RETIC %: 1.95 % (ref 0.70–2.10)
Retic Ct Abs: 65.13 10*3/uL (ref 33.70–90.70)
WBC: 6.8 10*3/uL (ref 3.9–10.3)
lymph#: 2.5 10*3/uL (ref 0.9–3.3)

## 2013-09-08 MED ORDER — HYDROXYUREA 500 MG PO CAPS
500.0000 mg | ORAL_CAPSULE | Freq: Every day | ORAL | Status: DC
Start: 2013-09-08 — End: 2014-02-16

## 2013-09-08 NOTE — Telephone Encounter (Signed)
lmonvm advising the pt of her lab and f/u appt in aug 2015.

## 2013-09-08 NOTE — Progress Notes (Signed)
[place] OFFICE PROGRESS NOTE  Patient Care Team: [name], MD as PCP - General W Delene Loll, MD as Attending Physician (Obstetrics and Gynecology) Heath Lark, MD as Consulting Physician (Hematology and Oncology)  DIAGNOSIS: Myeloproliferative disorder on hydroxyurea  SUMMARY OF ONCOLOGIC HISTORY: This patient used to be a blood donor. She was refused blood donation for several years due to anemia. A review of her CBC dated back to 2012 in note of mild anemia with associated thrombocytosis. In May of this year, she underwent extensive evaluation. Iron studies show that she had borderline iron deficiency. In June 2014, she had a bone marrow aspirate and biopsy. Results are inconclusive but there are presence of atypical megakaryocytes. Cytogenetics a study was normal. The patient was started on hydroxyurea. MPL mutation, BCR/ABL and JAK2 mutation testing was negative In December 2014, she had EGD and colonoscopy for evaluation of iron deficiency anemia and the tests were negative apart from mild nonspecific gastritis. She was placed on iron supplements, subsequently discontinued in January 2015  INTERVAL HISTORY: Savannah Evans 63 y.o. female returns for further followup. She is doing very well. No recent bleeding. No recent infection. Her energy level is fair.  I have reviewed the past medical history, past surgical history, social history and family history with the patient and they are unchanged from previous note.  ALLERGIES:  has No Known Allergies.  MEDICATIONS:  Current Outpatient Prescriptions  Medication Sig Dispense Refill  . Ascorbic Acid (VITAMIN C) 500 MG tablet Take 500 mg by mouth daily.        Marland Kitchen aspirin 81 MG tablet Take 81 mg by mouth daily.        . calcium-vitamin D (OSCAL) 250-125 MG-UNIT per tablet Take 1 tablet by mouth daily.        . cetirizine (ZYRTEC) 10 MG tablet Take 10 mg by mouth daily as needed for allergies.       . fish  oil-omega-3 fatty acids 1000 MG capsule Take 1 g by mouth daily.       Marland Kitchen glipiZIDE (GLUCOTROL) 5 MG tablet Take 1 tablet (5 mg total) by mouth 2 (two) times daily before a meal.  180 tablet  3  . hydroxyurea (HYDREA) 500 MG capsule Take 1 capsule (500 mg total) by mouth daily. May take with food to minimize GI side effects.  90 capsule  4  . metFORMIN (GLUCOPHAGE) 1000 MG tablet Take 1 tablet (1,000 mg total) by mouth 2 (two) times daily with a meal.  180 tablet  1  . Multiple Vitamin (MULTIVITAMIN) tablet Take 1 tablet by mouth daily.        Marland Kitchen omeprazole (PRILOSEC OTC) 20 MG tablet Take 20 mg by mouth daily.       . simvastatin (ZOCOR) 10 MG tablet Take 1 tablet (10 mg total) by mouth daily.  90 tablet  1  . sitaGLIPtin (JANUVIA) 100 MG tablet Take 1 tablet (100 mg total) by mouth daily.  90 tablet  3   No current facility-administered medications for this visit.    REVIEW OF SYSTEMS:   Constitutional: Denies fevers, chills or abnormal weight loss Eyes: Denies blurriness of vision Ears, nose, mouth, throat, and face: Denies mucositis or sore throat Respiratory: Denies cough, dyspnea or wheezes Cardiovascular: Denies palpitation, chest discomfort or lower extremity swelling Gastrointestinal:  Denies nausea, heartburn or change in bowel habits Skin: Denies abnormal skin rashes Lymphatics: Denies new lymphadenopathy or easy bruising Neurological:Denies numbness, tingling or  new weaknesses Behavioral/Psych: Mood is stable, no new changes  All other systems were reviewed with the patient and are negative.  PHYSICAL EXAMINATION: ECOG PERFORMANCE STATUS: 0 - Asymptomatic  Filed Vitals:   09/08/13 1428  BP: 129/64  Pulse: 99  Temp: 98.3 F (36.8 C)  Resp: 18   Filed Weights   09/08/13 1428  Weight: 163 lb 9.6 oz (74.208 kg)    GENERAL:alert, no distress and comfortable SKIN: skin color, texture, turgor are normal, no rashes or significant lesions EYES: normal, Conjunctiva are pink  and non-injected, sclera clear OROPHARYNX:no exudate, no erythema and lips, buccal mucosa, and tongue normal  NECK: supple, thyroid normal size, non-tender, without nodularity LYMPH:  no palpable lymphadenopathy in the cervical, axillary or inguinal LUNGS: clear to auscultation and percussion with normal breathing effort HEART: regular rate & rhythm and no murmurs and no lower extremity edema ABDOMEN:abdomen soft, non-tender and normal bowel sounds Musculoskeletal:no cyanosis of digits and no clubbing  NEURO: alert & oriented x 3 with fluent speech, no focal motor/sensory deficits  LABORATORY DATA:  I have reviewed the data as listed    Component Value Date/Time   NA 136 03/31/2013 1414   NA 137 08/05/2012 1118   K 4.0 03/31/2013 1414   K 4.5 08/05/2012 1118   CL 100 08/05/2012 1118   CO2 24 03/31/2013 1414   CO2 26 08/05/2012 1118   GLUCOSE 178* 03/31/2013 1414   GLUCOSE 92 08/05/2012 1118   BUN 8.1 03/31/2013 1414   BUN 6 08/05/2012 1118   CREATININE 0.7 03/31/2013 1414   CREATININE 0.6 08/05/2012 1118   CALCIUM 10.1 03/31/2013 1414   CALCIUM 9.7 08/05/2012 1118   PROT 7.2 03/31/2013 1414   PROT 7.7 08/05/2012 1118   ALBUMIN 3.9 03/31/2013 1414   ALBUMIN 4.4 08/05/2012 1118   AST 19 03/31/2013 1414   AST 19 08/05/2012 1118   ALT 15 03/31/2013 1414   ALT 18 08/05/2012 1118   ALKPHOS 55 03/31/2013 1414   ALKPHOS 47 08/05/2012 1118   BILITOT 0.28 03/31/2013 1414   BILITOT 0.3 08/05/2012 1118   GFRNONAA 112.68 10/29/2009 1221    No results found for this basename: SPEP,  UPEP,   kappa and lambda light chains    Lab Results  Component Value Date   WBC 6.8 09/08/2013   NEUTROABS 3.6 09/08/2013   HGB 11.1* 09/08/2013   HCT 32.7* 09/08/2013   MCV 97.9 09/08/2013   PLT 501* 09/08/2013      Chemistry      Component Value Date/Time   NA 136 03/31/2013 1414   NA 137 08/05/2012 1118   K 4.0 03/31/2013 1414   K 4.5 08/05/2012 1118   CL 100 08/05/2012 1118   CO2 24 03/31/2013 1414   CO2  26 08/05/2012 1118   BUN 8.1 03/31/2013 1414   BUN 6 08/05/2012 1118   CREATININE 0.7 03/31/2013 1414   CREATININE 0.6 08/05/2012 1118      Component Value Date/Time   CALCIUM 10.1 03/31/2013 1414   CALCIUM 9.7 08/05/2012 1118   ALKPHOS 55 03/31/2013 1414   ALKPHOS 47 08/05/2012 1118   AST 19 03/31/2013 1414   AST 19 08/05/2012 1118   ALT 15 03/31/2013 1414   ALT 18 08/05/2012 1118   BILITOT 0.28 03/31/2013 1414   BILITOT 0.3 08/05/2012 1118     ASSESSMENT & PLAN:  #1 mild thrombocytosis The cause of thrombocytosis is likely due to myeloproliferative disorder. Oral iron supplement did not help with  her thrombocytosis. Her prior bone marrow biopsy was inconclusive but overall presentation is highly suspicious for myeloproliferative disorder. I recommend she continue on hydroxyurea once a day. I do not want to increase the dose of hydroxyurea for fear that it may cause worsening anemia. The patient agreed to continue on current dose. #2 iron deficiency Iron supplement did not make her ferritin goes higher. Her blood count is stable. #3 anemia This is likely anemia of chronic disease. The patient denies recent history of bleeding such as epistaxis, hematuria or hematochezia. She is asymptomatic from the anemia. We will observe for now.  She does not require transfusion now.    Orders Placed This Encounter  Procedures  . CBC & Diff and Retic    Standing Status: Future     Number of Occurrences:      Standing Expiration Date: 09/08/2014  . Ferritin    Standing Status: Future     Number of Occurrences:      Standing Expiration Date: 09/08/2014   All questions were answered. The patient knows to call the clinic with any problems, questions or concerns. No barriers to learning was detected. I spent 15 minutes counseling the patient face to face. The total time spent in the appointment was 20 minutes and more than 50% was on counseling and review of test results     Heath Lark,  MD 09/08/2013 3:24 PM

## 2013-09-09 ENCOUNTER — Other Ambulatory Visit: Payer: Managed Care, Other (non HMO)

## 2013-09-09 ENCOUNTER — Ambulatory Visit: Payer: Managed Care, Other (non HMO) | Admitting: Hematology and Oncology

## 2013-09-09 LAB — FERRITIN CHCC: Ferritin: 43 ng/ml (ref 9–269)

## 2013-09-12 ENCOUNTER — Telehealth: Payer: Self-pay | Admitting: Hematology and Oncology

## 2013-09-12 NOTE — Telephone Encounter (Signed)
Pt called and r/s  lab and MD  date that was given to her due to MD PAL, appt is now 8/27 per pt

## 2013-11-12 LAB — HM DIABETES EYE EXAM

## 2014-01-08 ENCOUNTER — Ambulatory Visit (HOSPITAL_BASED_OUTPATIENT_CLINIC_OR_DEPARTMENT_OTHER): Payer: Managed Care, Other (non HMO) | Admitting: Hematology and Oncology

## 2014-01-08 ENCOUNTER — Other Ambulatory Visit (HOSPITAL_BASED_OUTPATIENT_CLINIC_OR_DEPARTMENT_OTHER): Payer: Managed Care, Other (non HMO)

## 2014-01-08 ENCOUNTER — Other Ambulatory Visit: Payer: Self-pay | Admitting: Gynecology

## 2014-01-08 ENCOUNTER — Encounter: Payer: Self-pay | Admitting: Hematology and Oncology

## 2014-01-08 VITALS — BP 146/63 | HR 97 | Temp 98.5°F | Resp 18 | Ht 64.0 in | Wt 164.5 lb

## 2014-01-08 DIAGNOSIS — D649 Anemia, unspecified: Secondary | ICD-10-CM

## 2014-01-08 DIAGNOSIS — D473 Essential (hemorrhagic) thrombocythemia: Secondary | ICD-10-CM

## 2014-01-08 LAB — CBC & DIFF AND RETIC
BASO%: 0.5 % (ref 0.0–2.0)
Basophils Absolute: 0 10*3/uL (ref 0.0–0.1)
EOS%: 1.4 % (ref 0.0–7.0)
Eosinophils Absolute: 0.1 10*3/uL (ref 0.0–0.5)
HEMATOCRIT: 31.1 % — AB (ref 34.8–46.6)
HEMOGLOBIN: 10.7 g/dL — AB (ref 11.6–15.9)
IMMATURE RETIC FRACT: 9.9 % (ref 1.60–10.00)
LYMPH#: 2.1 10*3/uL (ref 0.9–3.3)
LYMPH%: 38 % (ref 14.0–49.7)
MCH: 34.2 pg — ABNORMAL HIGH (ref 25.1–34.0)
MCHC: 34.4 g/dL (ref 31.5–36.0)
MCV: 99.4 fL (ref 79.5–101.0)
MONO#: 0.4 10*3/uL (ref 0.1–0.9)
MONO%: 7.9 % (ref 0.0–14.0)
NEUT#: 2.9 10*3/uL (ref 1.5–6.5)
NEUT%: 52.2 % (ref 38.4–76.8)
PLATELETS: 435 10*3/uL — AB (ref 145–400)
RBC: 3.13 10*6/uL — ABNORMAL LOW (ref 3.70–5.45)
RDW: 12.5 % (ref 11.2–14.5)
Retic %: 1.88 % (ref 0.70–2.10)
Retic Ct Abs: 58.84 10*3/uL (ref 33.70–90.70)
WBC: 5.6 10*3/uL (ref 3.9–10.3)

## 2014-01-08 LAB — HM MAMMOGRAPHY: HM Mammogram: NORMAL

## 2014-01-08 LAB — FERRITIN CHCC: FERRITIN: 39 ng/mL (ref 9–269)

## 2014-01-08 NOTE — Progress Notes (Signed)
[place] OFFICE PROGRESS NOTE  Patient Care Team: [name], MD as PCP - General W Delene Loll, MD as Attending Physician (Obstetrics and Gynecology) Heath Lark, MD as Consulting Physician (Hematology and Oncology)  SUMMARY OF ONCOLOGIC HISTORY: This patient used to be a blood donor. She was refused blood donation for several years due to anemia. A review of her CBC dated back to 2012 in note of mild anemia with associated thrombocytosis. In May of this year, she underwent extensive evaluation. Iron studies show that she had borderline iron deficiency. In June 2014, she had a bone marrow aspirate and biopsy. Results are inconclusive but there are presence of atypical megakaryocytes. Cytogenetics a study was normal. The patient was started on hydroxyurea. MPL mutation, BCR/ABL and JAK2 mutation testing was negative In December 2014, she had EGD and colonoscopy for evaluation of iron deficiency anemia and the tests were negative apart from mild nonspecific gastritis. She was placed on iron supplements, subsequently discontinued in January 2015 On 01/08/2014, dose of hydroxyurea was modified to. She is instructed to take 6 days a week and skip on Sundays. INTERVAL HISTORY: Please see below for problem oriented charting. She feels well. Denies recent episodes of thrombosis. The patient denies any recent signs or symptoms of bleeding such as spontaneous epistaxis, hematuria or hematochezia. She complained of mild fatigue with exertion.  REVIEW OF SYSTEMS:   Constitutional: Denies fevers, chills or abnormal weight loss Eyes: Denies blurriness of vision Ears, nose, mouth, throat, and face: Denies mucositis or sore throat Respiratory: Denies cough, dyspnea or wheezes Cardiovascular: Denies palpitation, chest discomfort or lower extremity swelling Gastrointestinal:  Denies nausea, heartburn or change in bowel habits Skin: Denies abnormal skin rashes Lymphatics: Denies new  lymphadenopathy or easy bruising Neurological:Denies numbness, tingling or new weaknesses Behavioral/Psych: Mood is stable, no new changes  All other systems were reviewed with the patient and are negative.  I have reviewed the past medical history, past surgical history, social history and family history with the patient and they are unchanged from previous note.  ALLERGIES:  has No Known Allergies.  MEDICATIONS:  Current Outpatient Prescriptions  Medication Sig Dispense Refill  . Ascorbic Acid (VITAMIN C) 500 MG tablet Take 500 mg by mouth daily.        Marland Kitchen aspirin 81 MG tablet Take 81 mg by mouth daily.        . calcium-vitamin D (OSCAL) 250-125 MG-UNIT per tablet Take 1 tablet by mouth daily.        . cetirizine (ZYRTEC) 10 MG tablet Take 10 mg by mouth daily as needed for allergies.       . fish oil-omega-3 fatty acids 1000 MG capsule Take 1 g by mouth daily.       Marland Kitchen glipiZIDE (GLUCOTROL) 5 MG tablet Take 1 tablet (5 mg total) by mouth 2 (two) times daily before a meal.  180 tablet  3  . hydroxyurea (HYDREA) 500 MG capsule Take 1 capsule (500 mg total) by mouth daily. May take with food to minimize GI side effects.  90 capsule  4  . metFORMIN (GLUCOPHAGE) 1000 MG tablet Take 1 tablet (1,000 mg total) by mouth 2 (two) times daily with a meal.  180 tablet  1  . Multiple Vitamin (MULTIVITAMIN) tablet Take 1 tablet by mouth daily.        Marland Kitchen omeprazole (PRILOSEC OTC) 20 MG tablet Take 20 mg by mouth daily.       . simvastatin (ZOCOR) 10  MG tablet Take 1 tablet (10 mg total) by mouth daily.  90 tablet  1  . sitaGLIPtin (JANUVIA) 100 MG tablet Take 1 tablet (100 mg total) by mouth daily.  90 tablet  3   No current facility-administered medications for this visit.    PHYSICAL EXAMINATION: ECOG PERFORMANCE STATUS: 0 - Asymptomatic  Filed Vitals:   01/08/14 1319  BP: 146/63  Pulse: 97  Temp: 98.5 F (36.9 C)  Resp: 18   Filed Weights   01/08/14 1319  Weight: 164 lb 8 oz (74.617 kg)     GENERAL:alert, no distress and comfortable SKIN: skin color, texture, turgor are normal, no rashes or significant lesions EYES: normal, Conjunctiva are pink and non-injected, sclera clear Musculoskeletal:no cyanosis of digits and no clubbing  NEURO: alert & oriented x 3 with fluent speech, no focal motor/sensory deficits  LABORATORY DATA:  I have reviewed the data as listed    Component Value Date/Time   NA 136 03/31/2013 1414   NA 137 08/05/2012 1118   K 4.0 03/31/2013 1414   K 4.5 08/05/2012 1118   CL 100 08/05/2012 1118   CO2 24 03/31/2013 1414   CO2 26 08/05/2012 1118   GLUCOSE 178* 03/31/2013 1414   GLUCOSE 92 08/05/2012 1118   BUN 8.1 03/31/2013 1414   BUN 6 08/05/2012 1118   CREATININE 0.7 03/31/2013 1414   CREATININE 0.6 08/05/2012 1118   CALCIUM 10.1 03/31/2013 1414   CALCIUM 9.7 08/05/2012 1118   PROT 7.2 03/31/2013 1414   PROT 7.7 08/05/2012 1118   ALBUMIN 3.9 03/31/2013 1414   ALBUMIN 4.4 08/05/2012 1118   AST 19 03/31/2013 1414   AST 19 08/05/2012 1118   ALT 15 03/31/2013 1414   ALT 18 08/05/2012 1118   ALKPHOS 55 03/31/2013 1414   ALKPHOS 47 08/05/2012 1118   BILITOT 0.28 03/31/2013 1414   BILITOT 0.3 08/05/2012 1118   GFRNONAA 112.68 10/29/2009 1221    No results found for this basename: SPEP,  UPEP,   kappa and lambda light chains    Lab Results  Component Value Date   WBC 5.6 01/08/2014   NEUTROABS 2.9 01/08/2014   HGB 10.7* 01/08/2014   HCT 31.1* 01/08/2014   MCV 99.4 01/08/2014   PLT 435* 01/08/2014      Chemistry      Component Value Date/Time   NA 136 03/31/2013 1414   NA 137 08/05/2012 1118   K 4.0 03/31/2013 1414   K 4.5 08/05/2012 1118   CL 100 08/05/2012 1118   CO2 24 03/31/2013 1414   CO2 26 08/05/2012 1118   BUN 8.1 03/31/2013 1414   BUN 6 08/05/2012 1118   CREATININE 0.7 03/31/2013 1414   CREATININE 0.6 08/05/2012 1118      Component Value Date/Time   CALCIUM 10.1 03/31/2013 1414   CALCIUM 9.7 08/05/2012 1118   ALKPHOS 55 03/31/2013 1414    ALKPHOS 47 08/05/2012 1118   AST 19 03/31/2013 1414   AST 19 08/05/2012 1118   ALT 15 03/31/2013 1414   ALT 18 08/05/2012 1118   BILITOT 0.28 03/31/2013 1414   BILITOT 0.3 08/05/2012 1118      ASSESSMENT & PLAN:  Essential thrombocythemia She tolerated hydroxyurea well, however, she is mildly anemic. I recommend we modify her dose for her to take 500 mg of hydroxyurea daily except on Sundays. I plan to see her back in 4 months with repeat history, physical examination and blood work. She agreed to proceed.  Anemia, unspecified  This is likely due to recent treatment and her underlying disease. The patient denies recent history of bleeding such as epistaxis, hematuria or hematochezia. She is asymptomatic from the anemia. I will observe for now.  I recommend adjustment of hydroxyurea dose as above.    Orders Placed This Encounter  Procedures  . CBC with Differential    Standing Status: Future     Number of Occurrences:      Standing Expiration Date: 02/12/2015   All questions were answered. The patient knows to call the clinic with any problems, questions or concerns. No barriers to learning was detected. I spent 15 minutes counseling the patient face to face. The total time spent in the appointment was 20 minutes and more than 50% was on counseling and review of test results     [facility], [place], MD 01/08/2014 1:34 PM

## 2014-01-08 NOTE — Assessment & Plan Note (Signed)
This is likely due to recent treatment and her underlying disease. The patient denies recent history of bleeding such as epistaxis, hematuria or hematochezia. She is asymptomatic from the anemia. I will observe for now.  I recommend adjustment of hydroxyurea dose as above.

## 2014-01-08 NOTE — Assessment & Plan Note (Signed)
She tolerated hydroxyurea well, however, she is mildly anemic. I recommend we modify her dose for her to take 500 mg of hydroxyurea daily except on Sundays. I plan to see her back in 4 months with repeat history, physical examination and blood work. She agreed to proceed.

## 2014-01-09 ENCOUNTER — Telehealth: Payer: Self-pay | Admitting: Hematology and Oncology

## 2014-01-09 LAB — CYTOLOGY - PAP

## 2014-01-09 NOTE — Telephone Encounter (Signed)
, °

## 2014-01-12 ENCOUNTER — Other Ambulatory Visit: Payer: Managed Care, Other (non HMO)

## 2014-01-12 ENCOUNTER — Ambulatory Visit: Payer: Managed Care, Other (non HMO) | Admitting: Hematology and Oncology

## 2014-01-17 ENCOUNTER — Other Ambulatory Visit: Payer: Self-pay | Admitting: Internal Medicine

## 2014-01-20 ENCOUNTER — Other Ambulatory Visit: Payer: Self-pay | Admitting: Internal Medicine

## 2014-02-16 ENCOUNTER — Ambulatory Visit (INDEPENDENT_AMBULATORY_CARE_PROVIDER_SITE_OTHER): Payer: Managed Care, Other (non HMO) | Admitting: Internal Medicine

## 2014-02-16 ENCOUNTER — Encounter: Payer: Self-pay | Admitting: Internal Medicine

## 2014-02-16 VITALS — BP 130/66 | HR 80 | Temp 98.2°F | Resp 14 | Wt 162.8 lb

## 2014-02-16 DIAGNOSIS — E785 Hyperlipidemia, unspecified: Secondary | ICD-10-CM

## 2014-02-16 DIAGNOSIS — I471 Supraventricular tachycardia: Secondary | ICD-10-CM

## 2014-02-16 DIAGNOSIS — IMO0002 Reserved for concepts with insufficient information to code with codable children: Secondary | ICD-10-CM

## 2014-02-16 DIAGNOSIS — Z0000 Encounter for general adult medical examination without abnormal findings: Secondary | ICD-10-CM

## 2014-02-16 DIAGNOSIS — L570 Actinic keratosis: Secondary | ICD-10-CM

## 2014-02-16 DIAGNOSIS — Z23 Encounter for immunization: Secondary | ICD-10-CM

## 2014-02-16 DIAGNOSIS — E1165 Type 2 diabetes mellitus with hyperglycemia: Secondary | ICD-10-CM

## 2014-02-16 DIAGNOSIS — D473 Essential (hemorrhagic) thrombocythemia: Secondary | ICD-10-CM

## 2014-02-16 LAB — CBC WITH DIFFERENTIAL/PLATELET
Basophils Absolute: 0 10*3/uL (ref 0.0–0.1)
Basophils Relative: 0.5 % (ref 0.0–3.0)
EOS PCT: 2.5 % (ref 0.0–5.0)
Eosinophils Absolute: 0.1 10*3/uL (ref 0.0–0.7)
HCT: 35.1 % — ABNORMAL LOW (ref 36.0–46.0)
HEMOGLOBIN: 11.5 g/dL — AB (ref 12.0–15.0)
LYMPHS PCT: 39.4 % (ref 12.0–46.0)
Lymphs Abs: 2.2 10*3/uL (ref 0.7–4.0)
MCHC: 32.8 g/dL (ref 30.0–36.0)
MCV: 102.9 fl — ABNORMAL HIGH (ref 78.0–100.0)
MONOS PCT: 8.7 % (ref 3.0–12.0)
Monocytes Absolute: 0.5 10*3/uL (ref 0.1–1.0)
Neutro Abs: 2.8 10*3/uL (ref 1.4–7.7)
Neutrophils Relative %: 48.9 % (ref 43.0–77.0)
Platelets: 601 10*3/uL — ABNORMAL HIGH (ref 150.0–400.0)
RBC: 3.41 Mil/uL — ABNORMAL LOW (ref 3.87–5.11)
RDW: 13 % (ref 11.5–15.5)
WBC: 5.7 10*3/uL (ref 4.0–10.5)

## 2014-02-16 LAB — COMPREHENSIVE METABOLIC PANEL
ALBUMIN: 4.2 g/dL (ref 3.5–5.2)
ALT: 16 U/L (ref 0–35)
AST: 21 U/L (ref 0–37)
Alkaline Phosphatase: 44 U/L (ref 39–117)
BUN: 6 mg/dL (ref 6–23)
CALCIUM: 9.7 mg/dL (ref 8.4–10.5)
CHLORIDE: 100 meq/L (ref 96–112)
CO2: 24 meq/L (ref 19–32)
CREATININE: 0.6 mg/dL (ref 0.4–1.2)
GFR: 108.94 mL/min (ref >60.00)
Glucose, Bld: 86 mg/dL (ref 70–99)
POTASSIUM: 4.4 meq/L (ref 3.5–5.1)
Sodium: 136 mEq/L (ref 135–145)
Total Bilirubin: 0.5 mg/dL (ref 0.2–1.2)
Total Protein: 7.8 g/dL (ref 6.0–8.3)

## 2014-02-16 LAB — HEMOGLOBIN A1C: HEMOGLOBIN A1C: 8 % — AB (ref 4.6–6.5)

## 2014-02-16 LAB — HM DIABETES FOOT EXAM

## 2014-02-16 LAB — MICROALBUMIN / CREATININE URINE RATIO
CREATININE, U: 42.2 mg/dL
MICROALB/CREAT RATIO: 0.9 mg/g (ref 0.0–30.0)
Microalb, Ur: 0.4 mg/dL (ref 0.0–1.9)

## 2014-02-16 NOTE — Assessment & Plan Note (Signed)
No recent recurrences

## 2014-02-16 NOTE — Assessment & Plan Note (Signed)
UTD on all cancer screening Flu shot today Needs to try to be more active--ongoing fatigue from thrombocytosis or Rx

## 2014-02-16 NOTE — Progress Notes (Signed)
Subjective:    Patient ID: Savannah Evans, female    DOB: 09/12/1950, 63 y.o.   MRN: [id number]  HPI Here for physical  Hasn't been feeling good again Anemic on the hydoxyurea--has been cut slightly (platelets are fine) Wonders if it is any better  Hasn't been checking sugars much---once a week Trouble with the price of the [place] still. Skips every other day usually Usually 170 or so No apparent hypoglycemic reactions Eye exam in July--- no retinopathy  Went to [facility]--Dr Gertie Fey Pap normal/mammogram fine also  Current Outpatient Prescriptions on File Prior to Visit  Medication Sig Dispense Refill  . Ascorbic Acid (VITAMIN C) 500 MG tablet Take 500 mg by mouth daily.        Marland Kitchen aspirin 81 MG tablet Take 81 mg by mouth daily.        . calcium-vitamin D (OSCAL) 250-125 MG-UNIT per tablet Take 1 tablet by mouth daily.        . cetirizine (ZYRTEC) 10 MG tablet Take 10 mg by mouth daily as needed for allergies.       . fish oil-omega-3 fatty acids 1000 MG capsule Take 1 g by mouth daily.       Marland Kitchen glipiZIDE (GLUCOTROL) 5 MG tablet Take 1 tablet (5 mg total) by mouth 2 (two) times daily before a meal.  180 tablet  3  . metFORMIN (GLUCOPHAGE) 1000 MG tablet TAKE ONE TABLET BY MOUTH TWICE DAILY WITH MEALS  180 tablet  0  . Multiple Vitamin (MULTIVITAMIN) tablet Take 1 tablet by mouth daily.        Marland Kitchen omeprazole (PRILOSEC OTC) 20 MG tablet Take 20 mg by mouth daily.       . simvastatin (ZOCOR) 10 MG tablet TAKE ONE TABLET BY MOUTH ONCE DAILY  90 tablet  0  . sitaGLIPtin (JANUVIA) 100 MG tablet Take 1 tablet (100 mg total) by mouth daily.  90 tablet  3   No current facility-administered medications on file prior to visit.    No Known Allergies  Past Medical History  Diagnosis Date  . Allergic rhinitis   . Diabetes mellitus type 2, noninsulin dependent   . GERD (gastroesophageal reflux disease)   . Hyperlipidemia   . SVT (supraventricular tachycardia)   . Osteoarthritis     . Diaphragmatic hernia   . Iron deficiency   . Essential thrombocythemia 10/21/2012    JAK2 neg; BCR-ABL neg; bone marrow biopsy on 09/29/12 showed normal cytogenetics.   . Anemia, unspecified 03/03/2013  . History of palpitations     Past Surgical History  Procedure Laterality Date  . Svt ablation  2000    Dr. Caryl Comes  . Colonoscopy      neg age 47.  Next age 65.     Family History  Problem Relation Age of Onset  . Uterine cancer Sister   . Diabetes Sister   . Macular degeneration Mother   . Heart attack Father   . Cancer Paternal Aunt     cancer?  . Cancer Paternal Uncle     cancer?  . Colon cancer Neg Hx     History   Social History  . Marital Status: Married    Spouse Name: N/A    Number of Children: 3  . Years of Education: N/A   Occupational History  . Manages loan administration area for [employer]    Social History Main Topics  . Smoking status: Never Smoker   . Smokeless  tobacco: Never Used  . Alcohol Use: 0.6 oz/week    1 Glasses of wine per week     Comment: social  . Drug Use: No  . Sexual Activity: Not on file   Other Topics Concern  . Not on file   Social History Narrative  . No narrative on file   Review of Systems  Constitutional: Positive for fatigue. Negative for unexpected weight change.       Weight down 2# Wears seat belt  HENT: Positive for dental problem. Negative for hearing loss and tinnitus.        Recent root canal  Eyes: Negative for visual disturbance.  Respiratory: Negative for cough, chest tightness and shortness of breath.   Cardiovascular: Negative for chest pain, palpitations and leg swelling.  Gastrointestinal: Negative for nausea, vomiting, abdominal pain, constipation and blood in stool.       Heartburn controlled with omeprazole--unless she eats really late  Endocrine: Negative for polydipsia and polyuria.  Genitourinary: Negative for dysuria, hematuria, difficulty urinating and dyspareunia.  Musculoskeletal:  Positive for arthralgias. Negative for back pain and joint swelling.       Some general joint aching at times  Skin: Negative for rash.       2 spots on face--right nose and above lip on right----crusty and not healing  Allergic/Immunologic: Positive for environmental allergies. Negative for immunocompromised state.       Mild seasonal symptoms---cetirizine helps  Neurological: Positive for headaches. Negative for dizziness, syncope, weakness, light-headedness and numbness.       Dull headache at times  Psychiatric/Behavioral: Positive for sleep disturbance. Negative for dysphoric mood. The patient is not nervous/anxious.        Stress with work--adding to her fatigue. Intermittent sleep problems--- if something on her mind       Objective:   Physical Exam  Constitutional: She is oriented to person, place, and time. She appears well-developed and well-nourished. No distress.  HENT:  Head: Normocephalic and atraumatic.  Right Ear: External ear normal.  Left Ear: External ear normal.  Mouth/Throat: Oropharynx is clear and moist. No oropharyngeal exudate.  Eyes: Conjunctivae and EOM are normal. Pupils are equal, round, and reactive to light.  Neck: Normal range of motion. Neck supple. No thyromegaly present.  Cardiovascular: Normal rate, regular rhythm, normal heart sounds and intact distal pulses.  Exam reveals no gallop.   No murmur heard. Pulmonary/Chest: Effort normal and breath sounds normal. No respiratory distress. She has no wheezes. She has no rales.  Abdominal: Soft. There is no tenderness.  Musculoskeletal: She exhibits no edema and no tenderness.  Lymphadenopathy:    She has no cervical adenopathy.  Neurological: She is alert and oriented to person, place, and time.  Normal sensation on plantar feet  Skin: No rash noted. No erythema.  No foot lesions 2-3m actinics just above right lip and on right nostril  Psychiatric: She has a normal mood and affect. Her behavior is  normal.          Assessment & Plan:

## 2014-02-16 NOTE — Assessment & Plan Note (Signed)
Follows with Dr Alvy Bimler Persistent fatigue despite decreased hydroxyurea Will recheck CBC

## 2014-02-16 NOTE — Assessment & Plan Note (Signed)
2 lesions treated with liquid nitrogen -- 30 seconds x 2

## 2014-02-16 NOTE — Assessment & Plan Note (Signed)
Not taking januvia regularly due to cost--and not as active Will recheck Discussed Rx

## 2014-02-16 NOTE — Assessment & Plan Note (Signed)
No problems with the statin Lab Results  Component Value Date   LDLCALC 54 08/11/2013

## 2014-03-23 ENCOUNTER — Telehealth: Payer: Self-pay

## 2014-03-23 NOTE — Telephone Encounter (Signed)
Pt notified as instructed that Tdap was given 2011; pt voiced understanding.

## 2014-03-23 NOTE — Telephone Encounter (Signed)
Pt got tdap in 2011

## 2014-03-23 NOTE — Telephone Encounter (Signed)
Pt will fax wellness form for completion to [phone]. Pt said this has been done in the past and it decreases pts ins premium.  Pt also requestate of last Tdap; pt going to be great grandmother; advised no Tdap on file; pt is going with her husband to pharmacy to get injection.

## 2014-04-11 ENCOUNTER — Other Ambulatory Visit: Payer: Self-pay | Admitting: Internal Medicine

## 2014-05-02 ENCOUNTER — Other Ambulatory Visit: Payer: Self-pay | Admitting: Internal Medicine

## 2014-05-04 ENCOUNTER — Ambulatory Visit (HOSPITAL_BASED_OUTPATIENT_CLINIC_OR_DEPARTMENT_OTHER): Payer: Managed Care, Other (non HMO) | Admitting: Hematology and Oncology

## 2014-05-04 ENCOUNTER — Telehealth: Payer: Self-pay | Admitting: Hematology and Oncology

## 2014-05-04 ENCOUNTER — Encounter: Payer: Self-pay | Admitting: Hematology and Oncology

## 2014-05-04 ENCOUNTER — Ambulatory Visit (HOSPITAL_BASED_OUTPATIENT_CLINIC_OR_DEPARTMENT_OTHER): Payer: Managed Care, Other (non HMO) | Admitting: Lab

## 2014-05-04 VITALS — BP 133/52 | HR 87 | Temp 98.7°F | Resp 19 | Ht 64.0 in | Wt 163.5 lb

## 2014-05-04 DIAGNOSIS — D473 Essential (hemorrhagic) thrombocythemia: Secondary | ICD-10-CM

## 2014-05-04 DIAGNOSIS — D630 Anemia in neoplastic disease: Secondary | ICD-10-CM

## 2014-05-04 DIAGNOSIS — D649 Anemia, unspecified: Secondary | ICD-10-CM

## 2014-05-04 LAB — CBC WITH DIFFERENTIAL/PLATELET
BASO%: 0.3 % (ref 0.0–2.0)
BASOS ABS: 0 10*3/uL (ref 0.0–0.1)
EOS%: 2 % (ref 0.0–7.0)
Eosinophils Absolute: 0.1 10*3/uL (ref 0.0–0.5)
HCT: 33.6 % — ABNORMAL LOW (ref 34.8–46.6)
HEMOGLOBIN: 11.2 g/dL — AB (ref 11.6–15.9)
LYMPH%: 39.6 % (ref 14.0–49.7)
MCH: 32.3 pg (ref 25.1–34.0)
MCHC: 33.3 g/dL (ref 31.5–36.0)
MCV: 96.8 fL (ref 79.5–101.0)
MONO#: 0.6 10*3/uL (ref 0.1–0.9)
MONO%: 9.6 % (ref 0.0–14.0)
NEUT#: 3 10*3/uL (ref 1.5–6.5)
NEUT%: 48.5 % (ref 38.4–76.8)
PLATELETS: 526 10*3/uL — AB (ref 145–400)
RBC: 3.47 10*6/uL — ABNORMAL LOW (ref 3.70–5.45)
RDW: 12.3 % (ref 11.2–14.5)
WBC: 6.1 10*3/uL (ref 3.9–10.3)
lymph#: 2.4 10*3/uL (ref 0.9–3.3)
nRBC: 0 % (ref 0–0)

## 2014-05-04 MED ORDER — ANAGRELIDE HCL 0.5 MG PO CAPS: 0.5000 mg | ORAL_CAPSULE | Freq: Every day | ORAL | Status: DC

## 2014-05-04 NOTE — Telephone Encounter (Signed)
gv and printed appt sched and avs for pt for Jan 2016 °

## 2014-05-04 NOTE — Assessment & Plan Note (Signed)
Despite reducing the dose of hydroxyurea, she is still anemic. I recommend we modify her dose for her to take 500 mg of hydroxyurea daily except on Mondays, Wednesdays and Fridays. I plan to add anagrelide daily. I discussed with her the risks, benefit, side effects of addition of anagrelide and she agreed to proceed. She will continue on 81 mg aspirin to prevent risk of thrombosis.

## 2014-05-04 NOTE — Assessment & Plan Note (Signed)
This is likely due to recent treatment. The patient denies recent history of bleeding such as epistaxis, hematuria or hematochezia. She is asymptomatic from the anemia. I will observe for now.  I plan to reduce the hydroxyurea dose further as described above.

## 2014-05-04 NOTE — Progress Notes (Signed)
[facility] OFFICE PROGRESS NOTE  Patient Care Team: [name], MD as PCP - General W Delene Loll, MD as Attending Physician (Obstetrics and Gynecology) Heath Lark, MD as Consulting Physician (Hematology and Oncology)  SUMMARY OF ONCOLOGIC HISTORY:  This patient used to be a blood donor. She was refused blood donation for several years due to anemia. A review of her CBC dated back to 2012 in note of mild anemia with associated thrombocytosis. In May of this year, she underwent extensive evaluation. Iron studies show that she had borderline iron deficiency. In June 2014, she had a bone marrow aspirate and biopsy. Results are inconclusive but there are presence of atypical megakaryocytes. Cytogenetics a study was normal. The patient was started on hydroxyurea. MPL mutation, BCR/ABL and JAK2 mutation testing was negative In December 2014, she had EGD and colonoscopy for evaluation of iron deficiency anemia and the tests were negative apart from mild nonspecific gastritis. She was placed on iron supplements, subsequently discontinued in January 2015 On 01/08/2014, dose of hydroxyurea was modified to. She is instructed to take 6 days a week and skip on Sundays. Since September 2015, she reduce hydroxyurea by skipping doses on Wednesday and Sundays On 05/04/2014, I reduce hydroxyurea dose further to 500 mg 4 days a week and to add anagrelide INTERVAL HISTORY: Please see below for problem oriented charting. She complained of profound fatigue. The patient denies any recent signs or symptoms of bleeding such as spontaneous epistaxis, hematuria or hematochezia.   REVIEW OF SYSTEMS:   Constitutional: Denies fevers, chills or abnormal weight loss Eyes: Denies blurriness of vision Ears, nose, mouth, throat, and face: Denies mucositis or sore throat Respiratory: Denies cough, dyspnea or wheezes Cardiovascular: Denies palpitation, chest discomfort or lower extremity  swelling Gastrointestinal:  Denies nausea, heartburn or change in bowel habits Skin: Denies abnormal skin rashes Lymphatics: Denies new lymphadenopathy or easy bruising Neurological:Denies numbness, tingling or new weaknesses Behavioral/Psych: Mood is stable, no new changes  All other systems were reviewed with the patient and are negative.  I have reviewed the past medical history, past surgical history, social history and family history with the patient and they are unchanged from previous note.  ALLERGIES:  has No Known Allergies.  MEDICATIONS:  Current Outpatient Prescriptions  Medication Sig Dispense Refill  . Ascorbic Acid (VITAMIN C) 500 MG tablet Take 500 mg by mouth daily.      Marland Kitchen aspirin 81 MG tablet Take 81 mg by mouth daily.      . calcium-vitamin D (OSCAL) 250-125 MG-UNIT per tablet Take 1 tablet by mouth daily.      . cetirizine (ZYRTEC) 10 MG tablet Take 10 mg by mouth daily as needed for allergies.     . fish oil-omega-3 fatty acids 1000 MG capsule Take 1 g by mouth daily.     Marland Kitchen glipiZIDE (GLUCOTROL) 5 MG tablet Take 1 tablet (5 mg total) by mouth 2 (two) times daily before a meal. 180 tablet 3  . hydroxyurea (HYDREA) 500 MG capsule Take 500 mg by mouth daily. May take with food to minimize GI side effects. Do Not take on sundays.    . metFORMIN (GLUCOPHAGE) 1000 MG tablet TAKE ONE TABLET BY MOUTH TWICE DAILY WITH MEALS 180 tablet 0  . Multiple Vitamin (MULTIVITAMIN) tablet Take 1 tablet by mouth daily.      Marland Kitchen omeprazole (PRILOSEC OTC) 20 MG tablet Take 20 mg by mouth daily.     . simvastatin (ZOCOR) 10 MG tablet  TAKE ONE TABLET BY MOUTH ONCE DAILY 90 tablet 0  . sitaGLIPtin (JANUVIA) 100 MG tablet Take 1 tablet (100 mg total) by mouth daily. 90 tablet 3  . anagrelide (AGRYLIN) 0.5 MG capsule Take 1 capsule (0.5 mg total) by mouth daily. 30 capsule 6   No current facility-administered medications for this visit.    PHYSICAL EXAMINATION: ECOG PERFORMANCE STATUS: 1 -  Symptomatic but completely ambulatory  Filed Vitals:   05/04/14 1439  BP: 133/52  Pulse: 87  Temp: 98.7 F (37.1 C)  Resp: 19   Filed Weights   05/04/14 1439  Weight: 163 lb 8 oz (74.163 kg)    GENERAL:alert, no distress and comfortable SKIN: skin color, texture, turgor are normal, no rashes or significant lesions EYES: normal, Conjunctiva are pink and non-injected, sclera clear OROPHARYNX:no exudate, no erythema and lips, buccal mucosa, and tongue normal  NECK: supple, thyroid normal size, non-tender, without nodularity LYMPH:  no palpable lymphadenopathy in the cervical, axillary or inguinal LUNGS: clear to auscultation and percussion with normal breathing effort HEART: regular rate & rhythm and no murmurs and no lower extremity edema ABDOMEN:abdomen soft, non-tender and normal bowel sounds Musculoskeletal:no cyanosis of digits and no clubbing  NEURO: alert & oriented x 3 with fluent speech, no focal motor/sensory deficits  LABORATORY DATA:  I have reviewed the data as listed    Component Value Date/Time   NA 136 02/16/2014 1231   NA 136 03/31/2013 1414   K 4.4 02/16/2014 1231   K 4.0 03/31/2013 1414   CL 100 02/16/2014 1231   CO2 24 02/16/2014 1231   CO2 24 03/31/2013 1414   GLUCOSE 86 02/16/2014 1231   GLUCOSE 178* 03/31/2013 1414   BUN 6 02/16/2014 1231   BUN 8.1 03/31/2013 1414   CREATININE 0.6 02/16/2014 1231   CREATININE 0.7 03/31/2013 1414   CALCIUM 9.7 02/16/2014 1231   CALCIUM 10.1 03/31/2013 1414   PROT 7.8 02/16/2014 1231   PROT 7.2 03/31/2013 1414   ALBUMIN 4.2 02/16/2014 1231   ALBUMIN 3.9 03/31/2013 1414   AST 21 02/16/2014 1231   AST 19 03/31/2013 1414   ALT 16 02/16/2014 1231   ALT 15 03/31/2013 1414   ALKPHOS 44 02/16/2014 1231   ALKPHOS 55 03/31/2013 1414   BILITOT 0.5 02/16/2014 1231   BILITOT 0.28 03/31/2013 1414   GFRNONAA 112.68 10/29/2009 1221    No results found for: SPEP, UPEP  Lab Results  Component Value Date   WBC 6.1  05/04/2014   NEUTROABS 3.0 05/04/2014   HGB 11.2* 05/04/2014   HCT 33.6* 05/04/2014   MCV 96.8 05/04/2014   PLT 526* 05/04/2014      Chemistry      Component Value Date/Time   NA 136 02/16/2014 1231   NA 136 03/31/2013 1414   K 4.4 02/16/2014 1231   K 4.0 03/31/2013 1414   CL 100 02/16/2014 1231   CO2 24 02/16/2014 1231   CO2 24 03/31/2013 1414   BUN 6 02/16/2014 1231   BUN 8.1 03/31/2013 1414   CREATININE 0.6 02/16/2014 1231   CREATININE 0.7 03/31/2013 1414      Component Value Date/Time   CALCIUM 9.7 02/16/2014 1231   CALCIUM 10.1 03/31/2013 1414   ALKPHOS 44 02/16/2014 1231   ALKPHOS 55 03/31/2013 1414   AST 21 02/16/2014 1231   AST 19 03/31/2013 1414   ALT 16 02/16/2014 1231   ALT 15 03/31/2013 1414   BILITOT 0.5 02/16/2014 1231   BILITOT 0.28 03/31/2013  [place] PLAN:  Essential thrombocythemia Despite reducing the dose of hydroxyurea, she is still anemic. I recommend we modify her dose for her to take 500 mg of hydroxyurea daily except on Mondays, Wednesdays and Fridays. I plan to add anagrelide daily. I discussed with her the risks, benefit, side effects of addition of anagrelide and she agreed to proceed. She will continue on 81 mg aspirin to prevent risk of thrombosis.    Anemia in neoplastic disease This is likely due to recent treatment. The patient denies recent history of bleeding such as epistaxis, hematuria or hematochezia. She is asymptomatic from the anemia. I will observe for now.  I plan to reduce the hydroxyurea dose further as described above.   No orders of the defined types were placed in this encounter.   All questions were answered. The patient knows to call the clinic with any problems, questions or concerns. No barriers to learning was detected. I spent 25 minutes counseling the patient face to face. The total time spent in the appointment was 30 minutes and more than 50% was on counseling and review of test results      [facility], [place], MD 05/04/2014 4:40 PM

## 2014-05-12 ENCOUNTER — Telehealth: Payer: Self-pay | Admitting: *Deleted

## 2014-05-12 NOTE — Telephone Encounter (Signed)
Pt notified of message below. Verbalized understanding 

## 2014-05-12 NOTE — Telephone Encounter (Signed)
-----   Message from Heath Lark, MD sent at 05/12/2014  2:02 PM EST ----- Tell her not to change her hydroxyurea dose, meaning she needs to take hydrea 5 days a week, skip weekends and I want her to keep the appt because i need to monitor her counts carefully ----- Message -----    From: Patton Salles, RN    Sent: 05/12/2014  10:08 AM      To: Heath Lark, MD  No- manufacturer issue ----- Message -----    From: Heath Lark, MD    Sent: 05/12/2014   9:51 AM      To: Patton Salles, RN  Can she get it from another pharmacy? If not, I guess we have no choice but to wait. I will have to move her next appt ----- Message -----    From: Patton Salles, RN    Sent: 05/12/2014   9:27 AM      To: Heath Lark, MD  Pharmacy states Agrylin is on manufacturer backorder until end of January...  Do you want her to take something else?

## 2014-05-30 ENCOUNTER — Other Ambulatory Visit: Payer: Self-pay | Admitting: Internal Medicine

## 2014-06-03 ENCOUNTER — Other Ambulatory Visit: Payer: Self-pay | Admitting: Hematology and Oncology

## 2014-06-03 DIAGNOSIS — D630 Anemia in neoplastic disease: Secondary | ICD-10-CM

## 2014-06-04 ENCOUNTER — Other Ambulatory Visit (HOSPITAL_BASED_OUTPATIENT_CLINIC_OR_DEPARTMENT_OTHER): Payer: BLUE CROSS/BLUE SHIELD

## 2014-06-04 ENCOUNTER — Ambulatory Visit (HOSPITAL_BASED_OUTPATIENT_CLINIC_OR_DEPARTMENT_OTHER): Payer: BLUE CROSS/BLUE SHIELD | Admitting: Hematology and Oncology

## 2014-06-04 ENCOUNTER — Encounter: Payer: Self-pay | Admitting: Hematology and Oncology

## 2014-06-04 ENCOUNTER — Telehealth: Payer: Self-pay | Admitting: Hematology and Oncology

## 2014-06-04 VITALS — BP 136/54 | HR 99 | Temp 98.2°F | Resp 20 | Ht 64.0 in | Wt 164.3 lb

## 2014-06-04 DIAGNOSIS — D473 Essential (hemorrhagic) thrombocythemia: Secondary | ICD-10-CM

## 2014-06-04 DIAGNOSIS — D630 Anemia in neoplastic disease: Secondary | ICD-10-CM

## 2014-06-04 LAB — CBC & DIFF AND RETIC
BASO%: 0.4 % (ref 0.0–2.0)
Basophils Absolute: 0 10*3/uL (ref 0.0–0.1)
EOS%: 2.7 % (ref 0.0–7.0)
Eosinophils Absolute: 0.2 10*3/uL (ref 0.0–0.5)
HEMATOCRIT: 32.8 % — AB (ref 34.8–46.6)
HGB: 11.1 g/dL — ABNORMAL LOW (ref 11.6–15.9)
Immature Retic Fract: 11.3 % — ABNORMAL HIGH (ref 1.60–10.00)
LYMPH%: 41.6 % (ref 14.0–49.7)
MCH: 32.6 pg (ref 25.1–34.0)
MCHC: 33.8 g/dL (ref 31.5–36.0)
MCV: 96.2 fL (ref 79.5–101.0)
MONO#: 0.5 10*3/uL (ref 0.1–0.9)
MONO%: 7.4 % (ref 0.0–14.0)
NEUT#: 3.2 10*3/uL (ref 1.5–6.5)
NEUT%: 47.9 % (ref 38.4–76.8)
Platelets: 565 10*3/uL — ABNORMAL HIGH (ref 145–400)
RBC: 3.41 10*6/uL — ABNORMAL LOW (ref 3.70–5.45)
RDW: 12.5 % (ref 11.2–14.5)
RETIC %: 2.06 % (ref 0.70–2.10)
Retic Ct Abs: 70.25 10*3/uL (ref 33.70–90.70)
WBC: 6.8 10*3/uL (ref 3.9–10.3)
lymph#: 2.8 10*3/uL (ref 0.9–3.3)
nRBC: 0 % (ref 0–0)

## 2014-06-04 LAB — IRON AND TIBC CHCC
%SAT: 18 % — ABNORMAL LOW (ref 21–57)
Iron: 64 ug/dL (ref 41–142)
TIBC: 360 ug/dL (ref 236–444)
UIBC: 296 ug/dL (ref 120–384)

## 2014-06-04 LAB — FERRITIN CHCC: Ferritin: 45 ng/ml (ref 9–269)

## 2014-06-04 NOTE — Assessment & Plan Note (Signed)
From my last visit, I recommend addition of anagrelide and reducing hydroxyurea to 6 tablets per week. Unfortunately, there is back order and she is not able to get anagrelide. Her anemia is stable. However she have progressive thrombocytosis. I recommend increasing hydroxyurea to 7 tablets per week and I will see her back in 6 weeks' time for further assessment. In the meantime, she will continue taking aspirin.

## 2014-06-04 NOTE — Assessment & Plan Note (Signed)
This is likely due to recent treatment. The patient denies recent history of bleeding such as epistaxis, hematuria or hematochezia. She is asymptomatic from the anemia. I will observe for now.   

## 2014-06-04 NOTE — Progress Notes (Signed)
[place] OFFICE PROGRESS NOTE  Patient Care Team: [name], MD as PCP - General W Delene Loll, MD as Attending Physician (Obstetrics and Gynecology) Heath Lark, MD as Consulting Physician (Hematology and Oncology)  SUMMARY OF ONCOLOGIC HISTORY: This patient used to be a blood donor. She was refused blood donation for several years due to anemia. A review of her CBC dated back to 2012 in note of mild anemia with associated thrombocytosis. In May of this year, she underwent extensive evaluation. Iron studies show that she had borderline iron deficiency. In June 2014, she had a bone marrow aspirate and biopsy. Results are inconclusive but there are presence of atypical megakaryocytes. Cytogenetics a study was normal. The patient was started on hydroxyurea. MPL mutation, BCR/ABL and JAK2 mutation testing was negative In December 2014, she had EGD and colonoscopy for evaluation of iron deficiency anemia and the tests were negative apart from mild nonspecific gastritis. She was placed on iron supplements, subsequently discontinued in January 2015 On 01/08/2014, dose of hydroxyurea was modified to. She is instructed to take 6 days a week and skip on Sundays. Since September 2015, she reduce hydroxyurea by skipping doses on Wednesday and Sundays On 05/04/2014, I reduce hydroxyurea dose further to 500 mg 4 days a week and to add anagrelide. Due to difficulties obtaining the pills, she had not been able to take hydroxyurea and anagrelide. On 06/04/14: dose of hydroxyurea is increased to 500 mg daily. INTERVAL HISTORY: Please see below for problem oriented charting. She feels well. She complained of occasional headaches. Denies recent bleeding.  REVIEW OF SYSTEMS:   Constitutional: Denies fevers, chills or abnormal weight loss Eyes: Denies blurriness of vision Ears, nose, mouth, throat, and face: Denies mucositis or sore throat Respiratory: Denies cough, dyspnea or  wheezes Cardiovascular: Denies palpitation, chest discomfort or lower extremity swelling Gastrointestinal:  Denies nausea, heartburn or change in bowel habits Skin: Denies abnormal skin rashes Lymphatics: Denies new lymphadenopathy or easy bruising Neurological:Denies numbness, tingling or new weaknesses Behavioral/Psych: Mood is stable, no new changes  All other systems were reviewed with the patient and are negative.  I have reviewed the past medical history, past surgical history, social history and family history with the patient and they are unchanged from previous note.  ALLERGIES:  has No Known Allergies.  MEDICATIONS:  Current Outpatient Prescriptions  Medication Sig Dispense Refill  . anagrelide (AGRYLIN) 0.5 MG capsule Take 1 capsule (0.5 mg total) by mouth daily. 30 capsule 6  . Ascorbic Acid (VITAMIN C) 500 MG tablet Take 500 mg by mouth daily.      Marland Kitchen aspirin 81 MG tablet Take 81 mg by mouth daily.      . calcium-vitamin D (OSCAL) 250-125 MG-UNIT per tablet Take 1 tablet by mouth daily.      . cetirizine (ZYRTEC) 10 MG tablet Take 10 mg by mouth daily as needed for allergies.     . fish oil-omega-3 fatty acids 1000 MG capsule Take 1 g by mouth daily.     Marland Kitchen glipiZIDE (GLUCOTROL) 5 MG tablet Take 1 tablet (5 mg total) by mouth 2 (two) times daily before a meal. 180 tablet 3  . hydroxyurea (HYDREA) 500 MG capsule Take 500 mg by mouth daily. May take with food to minimize GI side effects. Do Not take on sundays.    Marland Kitchen JANUVIA 100 MG tablet TAKE ONE TABLET BY MOUTH ONCE DAILY 90 tablet 0  . metFORMIN (GLUCOPHAGE) 1000 MG tablet TAKE ONE TABLET  BY MOUTH TWICE DAILY WITH MEALS 180 tablet 0  . Multiple Vitamin (MULTIVITAMIN) tablet Take 1 tablet by mouth daily.      Marland Kitchen omeprazole (PRILOSEC OTC) 20 MG tablet Take 20 mg by mouth daily.     . simvastatin (ZOCOR) 10 MG tablet TAKE ONE TABLET BY MOUTH ONCE DAILY 90 tablet 0   No current facility-administered medications for this visit.     PHYSICAL EXAMINATION: ECOG PERFORMANCE STATUS: 0 - Asymptomatic  Filed Vitals:   06/04/14 1401  BP: 136/54  Pulse: 99  Temp: 98.2 F (36.8 C)  Resp: 20   Filed Weights   06/04/14 1401  Weight: 164 lb 4.8 oz (74.526 kg)    GENERAL:alert, no distress and comfortable SKIN: skin color, texture, turgor are normal, no rashes or significant lesions EYES: normal, Conjunctiva are pink and non-injected, sclera clear Musculoskeletal:no cyanosis of digits and no clubbing  NEURO: alert & oriented x 3 with fluent speech, no focal motor/sensory deficits  LABORATORY DATA:  I have reviewed the data as listed    Component Value Date/Time   NA 136 02/16/2014 1231   NA 136 03/31/2013 1414   K 4.4 02/16/2014 1231   K 4.0 03/31/2013 1414   CL 100 02/16/2014 1231   CO2 24 02/16/2014 1231   CO2 24 03/31/2013 1414   GLUCOSE 86 02/16/2014 1231   GLUCOSE 178* 03/31/2013 1414   BUN 6 02/16/2014 1231   BUN 8.1 03/31/2013 1414   CREATININE 0.6 02/16/2014 1231   CREATININE 0.7 03/31/2013 1414   CALCIUM 9.7 02/16/2014 1231   CALCIUM 10.1 03/31/2013 1414   PROT 7.8 02/16/2014 1231   PROT 7.2 03/31/2013 1414   ALBUMIN 4.2 02/16/2014 1231   ALBUMIN 3.9 03/31/2013 1414   AST 21 02/16/2014 1231   AST 19 03/31/2013 1414   ALT 16 02/16/2014 1231   ALT 15 03/31/2013 1414   ALKPHOS 44 02/16/2014 1231   ALKPHOS 55 03/31/2013 1414   BILITOT 0.5 02/16/2014 1231   BILITOT 0.28 03/31/2013 1414   GFRNONAA 112.68 10/29/2009 1221    No results found for: SPEP, UPEP  Lab Results  Component Value Date   WBC 6.8 06/04/2014   NEUTROABS 3.2 06/04/2014   HGB 11.1* 06/04/2014   HCT 32.8* 06/04/2014   MCV 96.2 06/04/2014   PLT 565* 06/04/2014      Chemistry      Component Value Date/Time   NA 136 02/16/2014 1231   NA 136 03/31/2013 1414   K 4.4 02/16/2014 1231   K 4.0 03/31/2013 1414   CL 100 02/16/2014 1231   CO2 24 02/16/2014 1231   CO2 24 03/31/2013 1414   BUN 6 02/16/2014 1231   BUN  8.1 03/31/2013 1414   CREATININE 0.6 02/16/2014 1231   CREATININE 0.7 03/31/2013 1414      Component Value Date/Time   CALCIUM 9.7 02/16/2014 1231   CALCIUM 10.1 03/31/2013 1414   ALKPHOS 44 02/16/2014 1231   ALKPHOS 55 03/31/2013 1414   AST 21 02/16/2014 1231   AST 19 03/31/2013 1414   ALT 16 02/16/2014 1231   ALT 15 03/31/2013 1414   BILITOT 0.5 02/16/2014 1231   BILITOT 0.28 03/31/2013 1414      ASSESSMENT & PLAN:  Essential thrombocythemia From my last visit, I recommend addition of anagrelide and reducing hydroxyurea to 6 tablets per week. Unfortunately, there is back order and she is not able to get anagrelide. Her anemia is stable. However she have progressive thrombocytosis. I recommend  increasing hydroxyurea to 7 tablets per week and I will see her back in 6 weeks' time for further assessment. In the meantime, she will continue taking aspirin.    Anemia in neoplastic disease This is likely due to recent treatment. The patient denies recent history of bleeding such as epistaxis, hematuria or hematochezia. She is asymptomatic from the anemia. I will observe for now.    Orders Placed This Encounter  Procedures  . CBC with Differential    Standing Status: Standing     Number of Occurrences: 9     Standing Expiration Date: 06/05/2015   All questions were answered. The patient knows to call the clinic with any problems, questions or concerns. No barriers to learning was detected. I spent 15 minutes counseling the patient face to face. The total time spent in the appointment was 20 minutes and more than 50% was on counseling and review of test results     [facility], [place], MD 06/04/2014 2:15 PM

## 2014-06-04 NOTE — Telephone Encounter (Signed)
Gave asv & cal for March.

## 2014-06-17 ENCOUNTER — Other Ambulatory Visit: Payer: Self-pay | Admitting: Internal Medicine

## 2014-07-01 ENCOUNTER — Other Ambulatory Visit: Payer: Self-pay | Admitting: Internal Medicine

## 2014-07-16 ENCOUNTER — Telehealth: Payer: Self-pay | Admitting: Hematology and Oncology

## 2014-07-16 ENCOUNTER — Other Ambulatory Visit (HOSPITAL_BASED_OUTPATIENT_CLINIC_OR_DEPARTMENT_OTHER): Payer: BLUE CROSS/BLUE SHIELD

## 2014-07-16 ENCOUNTER — Ambulatory Visit (HOSPITAL_BASED_OUTPATIENT_CLINIC_OR_DEPARTMENT_OTHER): Payer: BLUE CROSS/BLUE SHIELD | Admitting: Hematology and Oncology

## 2014-07-16 VITALS — BP 130/63 | HR 98 | Temp 98.5°F | Resp 19 | Ht 64.0 in | Wt 162.9 lb

## 2014-07-16 DIAGNOSIS — D473 Essential (hemorrhagic) thrombocythemia: Secondary | ICD-10-CM

## 2014-07-16 DIAGNOSIS — D630 Anemia in neoplastic disease: Secondary | ICD-10-CM

## 2014-07-16 LAB — CBC WITH DIFFERENTIAL/PLATELET
BASO%: 0.4 % (ref 0.0–2.0)
Basophils Absolute: 0 10*3/uL (ref 0.0–0.1)
EOS ABS: 0.1 10*3/uL (ref 0.0–0.5)
EOS%: 1.7 % (ref 0.0–7.0)
HCT: 33.9 % — ABNORMAL LOW (ref 34.8–46.6)
HGB: 11.5 g/dL — ABNORMAL LOW (ref 11.6–15.9)
LYMPH%: 39.1 % (ref 14.0–49.7)
MCH: 32.9 pg (ref 25.1–34.0)
MCHC: 33.9 g/dL (ref 31.5–36.0)
MCV: 96.9 fL (ref 79.5–101.0)
MONO#: 0.6 10*3/uL (ref 0.1–0.9)
MONO%: 7.6 % (ref 0.0–14.0)
NEUT%: 51.2 % (ref 38.4–76.8)
NEUTROS ABS: 3.9 10*3/uL (ref 1.5–6.5)
PLATELETS: 482 10*3/uL — AB (ref 145–400)
RBC: 3.5 10*6/uL — ABNORMAL LOW (ref 3.70–5.45)
RDW: 13.1 % (ref 11.2–14.5)
WBC: 7.6 10*3/uL (ref 3.9–10.3)
lymph#: 3 10*3/uL (ref 0.9–3.3)

## 2014-07-16 NOTE — Progress Notes (Signed)
[place] OFFICE PROGRESS NOTE  Patient Care Team: [name], MD as PCP - General W Delene Loll, MD as Attending Physician (Obstetrics and Gynecology) Heath Lark, MD as Consulting Physician (Hematology and Oncology)  SUMMARY OF ONCOLOGIC HISTORY:  This patient used to be a blood donor. She was refused blood donation for several years due to anemia. A review of her CBC dated back to 2012 in note of mild anemia with associated thrombocytosis. In May of this year, she underwent extensive evaluation. Iron studies show that she had borderline iron deficiency. In June 2014, she had a bone marrow aspirate and biopsy. Results are inconclusive but there are presence of atypical megakaryocytes. Cytogenetics a study was normal. The patient was started on hydroxyurea. MPL mutation, BCR/ABL and JAK2 mutation testing was negative In December 2014, she had EGD and colonoscopy for evaluation of iron deficiency anemia and the tests were negative apart from mild nonspecific gastritis. She was placed on iron supplements, subsequently discontinued in January 2015 On 01/08/2014, dose of hydroxyurea was modified to. She is instructed to take 6 days a week and skip on Sundays. Since September 2015, she reduce hydroxyurea by skipping doses on Wednesday and Sundays On 05/04/2014, I reduce hydroxyurea dose further to 500 mg 4 days a week and to add anagrelide. Due to difficulties obtaining the pills, she had not been able to take hydroxyurea and anagrelide. On 06/04/14: dose of hydroxyurea is increased to 500 mg daily.  INTERVAL HISTORY: Please see below for problem oriented charting. She is doing well. She complained of fatigue. The patient denies any recent signs or symptoms of bleeding such as spontaneous epistaxis, hematuria or hematochezia.   REVIEW OF SYSTEMS:   Constitutional: Denies fevers, chills or abnormal weight loss Eyes: Denies blurriness of vision Ears, nose, mouth, throat,  and face: Denies mucositis or sore throat Respiratory: Denies cough, dyspnea or wheezes Cardiovascular: Denies palpitation, chest discomfort or lower extremity swelling Gastrointestinal:  Denies nausea, heartburn or change in bowel habits Skin: Denies abnormal skin rashes Lymphatics: Denies new lymphadenopathy or easy bruising Neurological:Denies numbness, tingling or new weaknesses Behavioral/Psych: Mood is stable, no new changes  All other systems were reviewed with the patient and are negative.  I have reviewed the past medical history, past surgical history, social history and family history with the patient and they are unchanged from previous note.  ALLERGIES:  has No Known Allergies.  MEDICATIONS:  Current Outpatient Prescriptions  Medication Sig Dispense Refill  . Ascorbic Acid (VITAMIN C) 500 MG tablet Take 500 mg by mouth daily.      Marland Kitchen aspirin 81 MG tablet Take 81 mg by mouth daily.      . calcium-vitamin D (OSCAL) 250-125 MG-UNIT per tablet Take 1 tablet by mouth daily.      . cetirizine (ZYRTEC) 10 MG tablet Take 10 mg by mouth daily as needed for allergies.     . fish oil-omega-3 fatty acids 1000 MG capsule Take 1 g by mouth daily.     Marland Kitchen glipiZIDE (GLUCOTROL) 5 MG tablet TAKE ONE TABLET BY MOUTH TWICE DAILY BEFORE  A  MEAL 180 tablet 0  . hydroxyurea (HYDREA) 500 MG capsule Take 500 mg by mouth daily. May take with food to minimize GI side effects. Do Not take on sundays.    Marland Kitchen JANUVIA 100 MG tablet TAKE ONE TABLET BY MOUTH ONCE DAILY 90 tablet 0  . metFORMIN (GLUCOPHAGE) 1000 MG tablet TAKE ONE TABLET BY MOUTH TWICE DAILY WITH  MEALS 180 tablet 1  . Multiple Vitamin (MULTIVITAMIN) tablet Take 1 tablet by mouth daily.      Marland Kitchen omeprazole (PRILOSEC OTC) 20 MG tablet Take 20 mg by mouth daily.     . simvastatin (ZOCOR) 10 MG tablet TAKE ONE TABLET BY MOUTH ONCE DAILY 90 tablet 0   No current facility-administered medications for this visit.    PHYSICAL EXAMINATION: ECOG  PERFORMANCE STATUS: 0 - Asymptomatic  Filed Vitals:   07/16/14 1423  BP: 130/63  Pulse: 98  Temp: 98.5 F (36.9 C)  Resp: 19   Filed Weights   07/16/14 1423  Weight: 162 lb 14.4 oz (73.891 kg)    GENERAL:alert, no distress and comfortable SKIN: skin color, texture, turgor are normal, no rashes or significant lesions EYES: normal, Conjunctiva are pink and non-injected, sclera clear OROPHARYNX:no exudate, no erythema and lips, buccal mucosa, and tongue normal  NECK: supple, thyroid normal size, non-tender, without nodularity LYMPH:  no palpable lymphadenopathy in the cervical, axillary or inguinal LUNGS: clear to auscultation and percussion with normal breathing effort HEART: regular rate & rhythm and no murmurs and no lower extremity edema ABDOMEN:abdomen soft, non-tender and normal bowel sounds Musculoskeletal:no cyanosis of digits and no clubbing  NEURO: alert & oriented x 3 with fluent speech, no focal motor/sensory deficits  LABORATORY DATA:  I have reviewed the data as listed    Component Value Date/Time   NA 136 02/16/2014 1231   NA 136 03/31/2013 1414   K 4.4 02/16/2014 1231   K 4.0 03/31/2013 1414   CL 100 02/16/2014 1231   CO2 24 02/16/2014 1231   CO2 24 03/31/2013 1414   GLUCOSE 86 02/16/2014 1231   GLUCOSE 178* 03/31/2013 1414   BUN 6 02/16/2014 1231   BUN 8.1 03/31/2013 1414   CREATININE 0.6 02/16/2014 1231   CREATININE 0.7 03/31/2013 1414   CALCIUM 9.7 02/16/2014 1231   CALCIUM 10.1 03/31/2013 1414   PROT 7.8 02/16/2014 1231   PROT 7.2 03/31/2013 1414   ALBUMIN 4.2 02/16/2014 1231   ALBUMIN 3.9 03/31/2013 1414   AST 21 02/16/2014 1231   AST 19 03/31/2013 1414   ALT 16 02/16/2014 1231   ALT 15 03/31/2013 1414   ALKPHOS 44 02/16/2014 1231   ALKPHOS 55 03/31/2013 1414   BILITOT 0.5 02/16/2014 1231   BILITOT 0.28 03/31/2013 1414   GFRNONAA 112.68 10/29/2009 1221    No results found for: SPEP, UPEP  Lab Results  Component Value Date   WBC 7.6  07/16/2014   NEUTROABS 3.9 07/16/2014   HGB 11.5* 07/16/2014   HCT 33.9* 07/16/2014   MCV 96.9 07/16/2014   PLT 482* 07/16/2014      Chemistry      Component Value Date/Time   NA 136 02/16/2014 1231   NA 136 03/31/2013 1414   K 4.4 02/16/2014 1231   K 4.0 03/31/2013 1414   CL 100 02/16/2014 1231   CO2 24 02/16/2014 1231   CO2 24 03/31/2013 1414   BUN 6 02/16/2014 1231   BUN 8.1 03/31/2013 1414   CREATININE 0.6 02/16/2014 1231   CREATININE 0.7 03/31/2013 1414      Component Value Date/Time   CALCIUM 9.7 02/16/2014 1231   CALCIUM 10.1 03/31/2013 1414   ALKPHOS 44 02/16/2014 1231   ALKPHOS 55 03/31/2013 1414   AST 21 02/16/2014 1231   AST 19 03/31/2013 1414   ALT 16 02/16/2014 1231   ALT 15 03/31/2013 1414   BILITOT 0.5 02/16/2014 1231  BILITOT 0.28 03/31/2013 1414     ASSESSMENT & PLAN:  Essential thrombocythemia From my last visit, I recommend increasing hydroxyurea to 7 tablets per week  She is doing well. She was not able to get anagrelide approved/filled. I will discontinue anagrelide and continue on hydroxyurea 500 mg daily along with aspirin. She agreed. I will see her back in 3 months for further assessment.   Anemia in neoplastic disease This is likely due to recent treatment. The patient denies recent history of bleeding such as epistaxis, hematuria or hematochezia. She is asymptomatic from the anemia. I will observe for now.    No orders of the defined types were placed in this encounter.   All questions were answered. The patient knows to call the clinic with any problems, questions or concerns. No barriers to learning was detected. I spent 15 minutes counseling the patient face to face. The total time spent in the appointment was 20 minutes and more than 50% was on counseling and review of test results     [facility], [place], MD 07/16/2014 2:44 PM

## 2014-07-16 NOTE — Assessment & Plan Note (Signed)
From my last visit, I recommend increasing hydroxyurea to 7 tablets per week  She is doing well. She was not able to get anagrelide approved/filled. I will discontinue anagrelide and continue on hydroxyurea 500 mg daily along with aspirin. She agreed. I will see her back in 3 months for further assessment.

## 2014-07-16 NOTE — Telephone Encounter (Signed)
lvm for pt regarding to June appt... °

## 2014-07-16 NOTE — Assessment & Plan Note (Signed)
This is likely due to recent treatment. The patient denies recent history of bleeding such as epistaxis, hematuria or hematochezia. She is asymptomatic from the anemia. I will observe for now.   

## 2014-07-20 ENCOUNTER — Other Ambulatory Visit: Payer: Self-pay | Admitting: Internal Medicine

## 2014-08-31 ENCOUNTER — Ambulatory Visit: Payer: Managed Care, Other (non HMO) | Admitting: Internal Medicine

## 2014-09-16 ENCOUNTER — Other Ambulatory Visit: Payer: Self-pay | Admitting: Internal Medicine

## 2014-09-25 ENCOUNTER — Encounter: Payer: Self-pay | Admitting: Internal Medicine

## 2014-09-25 ENCOUNTER — Ambulatory Visit (INDEPENDENT_AMBULATORY_CARE_PROVIDER_SITE_OTHER): Payer: Medicare Other | Admitting: Internal Medicine

## 2014-09-25 VITALS — BP 140/80 | HR 77 | Temp 98.6°F | Wt 161.0 lb

## 2014-09-25 DIAGNOSIS — E785 Hyperlipidemia, unspecified: Secondary | ICD-10-CM

## 2014-09-25 DIAGNOSIS — E119 Type 2 diabetes mellitus without complications: Secondary | ICD-10-CM

## 2014-09-25 DIAGNOSIS — I471 Supraventricular tachycardia: Secondary | ICD-10-CM

## 2014-09-25 DIAGNOSIS — D473 Essential (hemorrhagic) thrombocythemia: Secondary | ICD-10-CM

## 2014-09-25 LAB — LIPID PANEL
CHOLESTEROL: 141 mg/dL (ref 0–200)
HDL: 62.7 mg/dL (ref >39.00)
LDL Cholesterol: 64 mg/dL (ref 0–99)
NonHDL: 78.3
TRIGLYCERIDES: 74 mg/dL (ref 0.0–149.0)
Total CHOL/HDL Ratio: 2
VLDL: 14.8 mg/dL (ref 0.0–40.0)

## 2014-09-25 LAB — HM DIABETES FOOT EXAM

## 2014-09-25 LAB — HEMOGLOBIN A1C: Hgb A1c MFr Bld: 7.8 % — ABNORMAL HIGH (ref 4.6–6.5)

## 2014-09-25 NOTE — Assessment & Plan Note (Signed)
Doing well on the hydroxyurea now--no side effects Regular hematology follow up

## 2014-09-25 NOTE — Assessment & Plan Note (Signed)
No symptoms recently

## 2014-09-25 NOTE — Assessment & Plan Note (Signed)
Sugars not great but hopefully still acceptable control

## 2014-09-25 NOTE — Assessment & Plan Note (Signed)
No problems on the statin Due for labs

## 2014-09-25 NOTE — Progress Notes (Signed)
Pre visit review using our clinic review tool, if applicable. No additional management support is needed unless otherwise documented below in the visit note. 

## 2014-09-25 NOTE — Progress Notes (Signed)
Subjective:    Patient ID: Savannah Evans, female    DOB: 02/08/1951, 64 y.o.   MRN: [id number]  HPI Here for follow up of multiple medical conditions  Doing well Feels she is adjusted to the hydroxyurea Doesn't feel bad Not fatigued  Taking the [place] regularly Has lost a little weight--not much exercise but tries to eat right Checks sugars weekly--- usually under 150 fasting. Never over 200 Wonders about victoza--sister on this No sores or pain in feet  On statin No myalgias No GI upset  No palpitations No chest pain No SOB  Current Outpatient Prescriptions on File Prior to Visit  Medication Sig Dispense Refill  . Ascorbic Acid (VITAMIN C) 500 MG tablet Take 500 mg by mouth daily.      Marland Kitchen aspirin 81 MG tablet Take 81 mg by mouth daily.      . calcium-vitamin D (OSCAL) 250-125 MG-UNIT per tablet Take 1 tablet by mouth daily.      . cetirizine (ZYRTEC) 10 MG tablet Take 10 mg by mouth daily as needed for allergies.     . fish oil-omega-3 fatty acids 1000 MG capsule Take 1 g by mouth daily.     Marland Kitchen glipiZIDE (GLUCOTROL) 5 MG tablet TAKE ONE TABLET BY MOUTH TWICE DAILY BEFORE MEAL(S) 180 tablet 0  . hydroxyurea (HYDREA) 500 MG capsule Take 500 mg by mouth daily. May take with food to minimize GI side effects. Do Not take on sundays.    Marland Kitchen JANUVIA 100 MG tablet TAKE ONE TABLET BY MOUTH ONCE DAILY 90 tablet 0  . metFORMIN (GLUCOPHAGE) 1000 MG tablet TAKE ONE TABLET BY MOUTH TWICE DAILY WITH MEALS 180 tablet 1  . Multiple Vitamin (MULTIVITAMIN) tablet Take 1 tablet by mouth daily.      Marland Kitchen omeprazole (PRILOSEC OTC) 20 MG tablet Take 20 mg by mouth daily.     . simvastatin (ZOCOR) 10 MG tablet TAKE ONE TABLET BY MOUTH ONCE DAILY 90 tablet 0   No current facility-administered medications on file prior to visit.    No Known Allergies  Past Medical History  Diagnosis Date  . Allergic rhinitis   . Diabetes mellitus type 2, noninsulin dependent   . GERD (gastroesophageal reflux  disease)   . Hyperlipidemia   . SVT (supraventricular tachycardia)   . Osteoarthritis   . Diaphragmatic hernia   . Iron deficiency   . Essential thrombocythemia 10/21/2012    JAK2 neg; BCR-ABL neg; bone marrow biopsy on 09/29/12 showed normal cytogenetics.   . Anemia, unspecified 03/03/2013  . History of palpitations     Past Surgical History  Procedure Laterality Date  . Svt ablation  2000    Dr. Caryl Comes  . Colonoscopy      neg age 44.  Next age 1.     Family History  Problem Relation Age of Onset  . Uterine cancer Sister   . Diabetes Sister   . Macular degeneration Mother   . Heart attack Father   . Cancer Paternal Aunt     cancer?  . Cancer Paternal Uncle     cancer?  . Colon cancer Neg Hx     History   Social History  . Marital Status: Married    Spouse Name: N/A  . Number of Children: 3  . Years of Education: N/A   Occupational History  . Manages loan administration area for [employer]    Social History Main Topics  . Smoking status: Never Smoker   .  Smokeless tobacco: Never Used  . Alcohol Use: 0.6 oz/week    1 Glasses of wine per week     Comment: social  . Drug Use: No  . Sexual Activity: Not on file   Other Topics Concern  . Not on file   Social History Narrative   Review of Systems Some leg pain after prolonged sitting---stiff at first but then loosens up Sleeping well--occasional issue if work issues are on her mind No new skin spots of concern    Objective:   Physical Exam  Constitutional: She appears well-developed and well-nourished. No distress.  Neck: Normal range of motion. Neck supple. No thyromegaly present.  Cardiovascular: Normal rate, regular rhythm, normal heart sounds and intact distal pulses.  Exam reveals no gallop.   No murmur heard. Pulmonary/Chest: Effort normal and breath sounds normal. No respiratory distress. She has no wheezes. She has no rales.  Musculoskeletal: She exhibits no edema or tenderness.    Lymphadenopathy:    She has no cervical adenopathy.  Neurological:  Normal sensation in feet  Skin:  No foot lesions  Psychiatric: She has a normal mood and affect. Her behavior is normal.          Assessment & Plan:

## 2014-10-02 ENCOUNTER — Other Ambulatory Visit: Payer: Self-pay | Admitting: Internal Medicine

## 2014-10-16 ENCOUNTER — Encounter: Payer: Self-pay | Admitting: Hematology and Oncology

## 2014-10-16 ENCOUNTER — Telehealth: Payer: Self-pay | Admitting: Hematology and Oncology

## 2014-10-16 ENCOUNTER — Other Ambulatory Visit (HOSPITAL_BASED_OUTPATIENT_CLINIC_OR_DEPARTMENT_OTHER): Payer: Medicare Other

## 2014-10-16 ENCOUNTER — Ambulatory Visit (HOSPITAL_BASED_OUTPATIENT_CLINIC_OR_DEPARTMENT_OTHER): Payer: Medicare Other | Admitting: Hematology and Oncology

## 2014-10-16 VITALS — BP 112/46 | HR 88 | Temp 98.0°F | Resp 18 | Ht 64.0 in | Wt 162.8 lb

## 2014-10-16 DIAGNOSIS — D630 Anemia in neoplastic disease: Secondary | ICD-10-CM

## 2014-10-16 DIAGNOSIS — D473 Essential (hemorrhagic) thrombocythemia: Secondary | ICD-10-CM | POA: Diagnosis not present

## 2014-10-16 LAB — CBC WITH DIFFERENTIAL/PLATELET
BASO%: 0.5 % (ref 0.0–2.0)
Basophils Absolute: 0 10*3/uL (ref 0.0–0.1)
EOS%: 2.6 % (ref 0.0–7.0)
Eosinophils Absolute: 0.2 10*3/uL (ref 0.0–0.5)
HCT: 32.3 % — ABNORMAL LOW (ref 34.8–46.6)
HGB: 11.1 g/dL — ABNORMAL LOW (ref 11.6–15.9)
LYMPH%: 40.5 % (ref 14.0–49.7)
MCH: 34.2 pg — ABNORMAL HIGH (ref 25.1–34.0)
MCHC: 34.4 g/dL (ref 31.5–36.0)
MCV: 99.4 fL (ref 79.5–101.0)
MONO#: 0.5 10*3/uL (ref 0.1–0.9)
MONO%: 7.8 % (ref 0.0–14.0)
NEUT#: 3 10*3/uL (ref 1.5–6.5)
NEUT%: 48.6 % (ref 38.4–76.8)
Platelets: 504 10*3/uL — ABNORMAL HIGH (ref 145–400)
RBC: 3.25 10*6/uL — ABNORMAL LOW (ref 3.70–5.45)
RDW: 12.8 % (ref 11.2–14.5)
WBC: 6.2 10*3/uL (ref 3.9–10.3)
lymph#: 2.5 10*3/uL (ref 0.9–3.3)

## 2014-10-16 MED ORDER — HYDROXYUREA 500 MG PO CAPS: 500.0000 mg | ORAL_CAPSULE | Freq: Every day | ORAL | Status: DC

## 2014-10-16 NOTE — Assessment & Plan Note (Signed)
She is doing well. She was not able to get anagrelide approved/filled. I recommend she continues taking hydroxyurea 1 tablet daily without change in dose. I will see her back in 4 months for further follow-up.

## 2014-10-16 NOTE — Assessment & Plan Note (Signed)

## 2014-10-16 NOTE — Progress Notes (Signed)
[facility] OFFICE PROGRESS NOTE  Patient Care Team: [name], MD as PCP - General W Delene Loll, MD as Attending Physician (Obstetrics and Gynecology) Heath Lark, MD as Consulting Physician (Hematology and Oncology)  SUMMARY OF ONCOLOGIC HISTORY:  This patient used to be a blood donor. She was refused blood donation for several years due to anemia. A review of her CBC dated back to 2012 in note of mild anemia with associated thrombocytosis. In May of this year, she underwent extensive evaluation. Iron studies show that she had borderline iron deficiency. In June 2014, she had a bone marrow aspirate and biopsy. Results are inconclusive but there are presence of atypical megakaryocytes. Cytogenetics a study was normal. The patient was started on hydroxyurea. MPL mutation, BCR/ABL and JAK2 mutation testing was negative In December 2014, she had EGD and colonoscopy for evaluation of iron deficiency anemia and the tests were negative apart from mild nonspecific gastritis. She was placed on iron supplements, subsequently discontinued in January 2015 On 01/08/2014, dose of hydroxyurea was modified to. She is instructed to take 6 days a week and skip on Sundays. Since September 2015, she reduce hydroxyurea by skipping doses on Wednesday and Sundays On 05/04/2014, I reduce hydroxyurea dose further to 500 mg 4 days a week and to add anagrelide. Due to difficulties obtaining the pills, she had not been able to take hydroxyurea and anagrelide. On 06/04/14: dose of hydroxyurea is increased to 500 mg daily.  INTERVAL HISTORY: Please see below for problem oriented charting. She feels well. Denies recent infection. Denies worsening fatigue. The patient denies any recent signs or symptoms of bleeding such as spontaneous epistaxis, hematuria or hematochezia.   REVIEW OF SYSTEMS:   Constitutional: Denies fevers, chills or abnormal weight loss Eyes: Denies blurriness of vision Ears,  nose, mouth, throat, and face: Denies mucositis or sore throat Respiratory: Denies cough, dyspnea or wheezes Cardiovascular: Denies palpitation, chest discomfort or lower extremity swelling Gastrointestinal:  Denies nausea, heartburn or change in bowel habits Skin: Denies abnormal skin rashes Lymphatics: Denies new lymphadenopathy or easy bruising Neurological:Denies numbness, tingling or new weaknesses Behavioral/Psych: Mood is stable, no new changes  All other systems were reviewed with the patient and are negative.  I have reviewed the past medical history, past surgical history, social history and family history with the patient and they are unchanged from previous note.  ALLERGIES:  has No Known Allergies.  MEDICATIONS:  Current Outpatient Prescriptions  Medication Sig Dispense Refill  . Ascorbic Acid (VITAMIN C) 500 MG tablet Take 500 mg by mouth daily.      Marland Kitchen aspirin 81 MG tablet Take 81 mg by mouth daily.      . calcium-vitamin D (OSCAL) 250-125 MG-UNIT per tablet Take 1 tablet by mouth daily.      . cetirizine (ZYRTEC) 10 MG tablet Take 10 mg by mouth daily as needed for allergies.     . fish oil-omega-3 fatty acids 1000 MG capsule Take 1 g by mouth daily.     Marland Kitchen glipiZIDE (GLUCOTROL) 5 MG tablet TAKE ONE TABLET BY MOUTH TWICE DAILY BEFORE MEAL(S) 180 tablet 0  . hydroxyurea (HYDREA) 500 MG capsule Take 1 capsule (500 mg total) by mouth daily. 90 capsule 3  . JANUVIA 100 MG tablet TAKE ONE TABLET BY MOUTH ONCE DAILY 90 tablet 3  . metFORMIN (GLUCOPHAGE) 1000 MG tablet TAKE ONE TABLET BY MOUTH TWICE DAILY WITH MEALS 180 tablet 1  . Multiple Vitamin (MULTIVITAMIN) tablet Take 1  tablet by mouth daily.      Marland Kitchen omeprazole (PRILOSEC OTC) 20 MG tablet Take 20 mg by mouth daily.     . simvastatin (ZOCOR) 10 MG tablet TAKE ONE TABLET BY MOUTH ONCE DAILY 90 tablet 0   No current facility-administered medications for this visit.    PHYSICAL EXAMINATION: ECOG PERFORMANCE STATUS: 0 -  Asymptomatic  Filed Vitals:   10/16/14 1351  BP: 112/46  Pulse: 88  Temp: 98 F (36.7 C)  Resp: 18   Filed Weights   10/16/14 1351  Weight: 162 lb 12.8 oz (73.846 kg)    GENERAL:alert, no distress and comfortable SKIN: skin color, texture, turgor are normal, no rashes or significant lesions EYES: normal, Conjunctiva are pink and non-injected, sclera clear Musculoskeletal:no cyanosis of digits and no clubbing  NEURO: alert & oriented x 3 with fluent speech, no focal motor/sensory deficits  LABORATORY DATA:  I have reviewed the data as listed    Component Value Date/Time   NA 136 02/16/2014 1231   NA 136 03/31/2013 1414   K 4.4 02/16/2014 1231   K 4.0 03/31/2013 1414   CL 100 02/16/2014 1231   CO2 24 02/16/2014 1231   CO2 24 03/31/2013 1414   GLUCOSE 86 02/16/2014 1231   GLUCOSE 178* 03/31/2013 1414   BUN 6 02/16/2014 1231   BUN 8.1 03/31/2013 1414   CREATININE 0.6 02/16/2014 1231   CREATININE 0.7 03/31/2013 1414   CALCIUM 9.7 02/16/2014 1231   CALCIUM 10.1 03/31/2013 1414   PROT 7.8 02/16/2014 1231   PROT 7.2 03/31/2013 1414   ALBUMIN 4.2 02/16/2014 1231   ALBUMIN 3.9 03/31/2013 1414   AST 21 02/16/2014 1231   AST 19 03/31/2013 1414   ALT 16 02/16/2014 1231   ALT 15 03/31/2013 1414   ALKPHOS 44 02/16/2014 1231   ALKPHOS 55 03/31/2013 1414   BILITOT 0.5 02/16/2014 1231   BILITOT 0.28 03/31/2013 1414   GFRNONAA 112.68 10/29/2009 1221    No results found for: SPEP, UPEP  Lab Results  Component Value Date   WBC 6.2 10/16/2014   NEUTROABS 3.0 10/16/2014   HGB 11.1* 10/16/2014   HCT 32.3* 10/16/2014   MCV 99.4 10/16/2014   PLT 504* 10/16/2014      Chemistry      Component Value Date/Time   NA 136 02/16/2014 1231   NA 136 03/31/2013 1414   K 4.4 02/16/2014 1231   K 4.0 03/31/2013 1414   CL 100 02/16/2014 1231   CO2 24 02/16/2014 1231   CO2 24 03/31/2013 1414   BUN 6 02/16/2014 1231   BUN 8.1 03/31/2013 1414   CREATININE 0.6 02/16/2014 1231    CREATININE 0.7 03/31/2013 1414      Component Value Date/Time   CALCIUM 9.7 02/16/2014 1231   CALCIUM 10.1 03/31/2013 1414   ALKPHOS 44 02/16/2014 1231   ALKPHOS 55 03/31/2013 1414   AST 21 02/16/2014 1231   AST 19 03/31/2013 1414   ALT 16 02/16/2014 1231   ALT 15 03/31/2013 1414   BILITOT 0.5 02/16/2014 1231   BILITOT 0.28 03/31/2013 1414      ASSESSMENT & PLAN:  Essential thrombocythemia She is doing well. She was not able to get anagrelide approved/filled. I recommend she continues taking hydroxyurea 1 tablet daily without change in dose. I will see her back in 4 months for further follow-up.   Anemia in neoplastic disease This is likely due to recent treatment. The patient denies recent history of bleeding such as  epistaxis, hematuria or hematochezia. She is asymptomatic from the anemia. I will observe for now.  She does not require transfusion now. I will continue the chemotherapy at current dose without dosage adjustment.  If the anemia gets progressive worse in the future, I might have to delay her treatment or adjust the chemotherapy dose.     No orders of the defined types were placed in this encounter.   All questions were answered. The patient knows to call the clinic with any problems, questions or concerns. No barriers to learning was detected. I spent 15 minutes counseling the patient face to face. The total time spent in the appointment was 20 minutes and more than 50% was on counseling and review of test results     [facility], [place], MD 10/16/2014 2:13 PM

## 2014-10-16 NOTE — Telephone Encounter (Signed)
per pof to sch pt appt-gave pt avs °

## 2014-10-23 ENCOUNTER — Other Ambulatory Visit: Payer: Self-pay | Admitting: Internal Medicine

## 2014-11-09 ENCOUNTER — Other Ambulatory Visit: Payer: Self-pay

## 2014-11-28 IMAGING — CT CT BIOPSY
1 of 5 series · 14 of 32 positions shown, 19 images · non-contrast
Comparison: none

CLINICAL DATA: Thrombocytosis and need for bone marrow biopsy.

[Series 2: [id number] · axial · 0.74mm/px · z∈[-134,-34]mm · 14 of 24 slices shown, 19 images]
[im 2/24  soft-tissue]
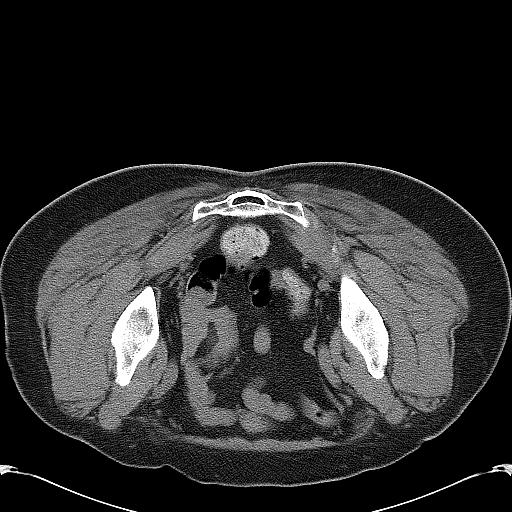
[im 2/24  bone]
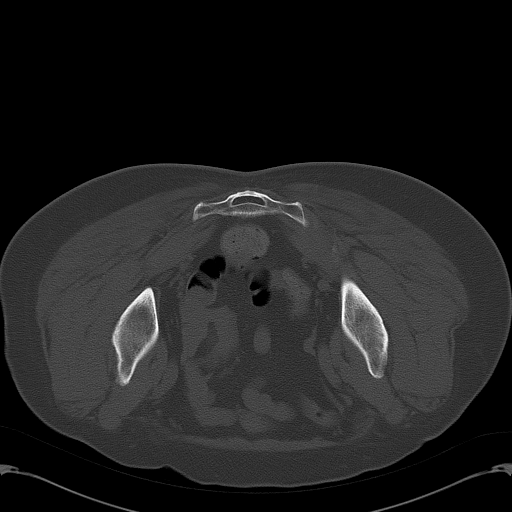
[im 4/24  soft-tissue]
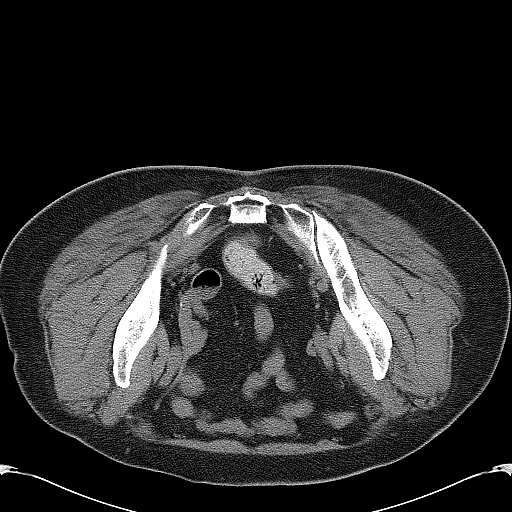
[im 5/24  soft-tissue]
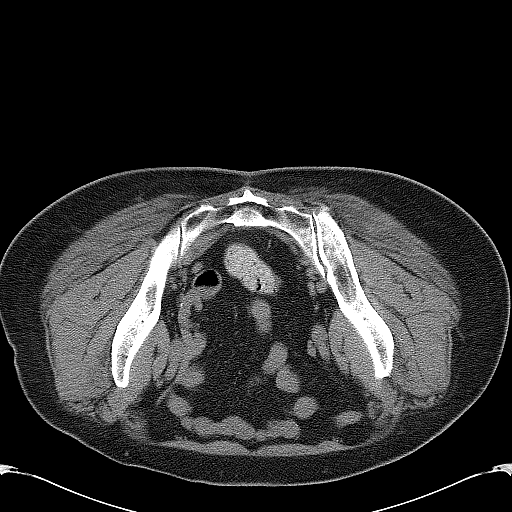
[im 7/24  soft-tissue]
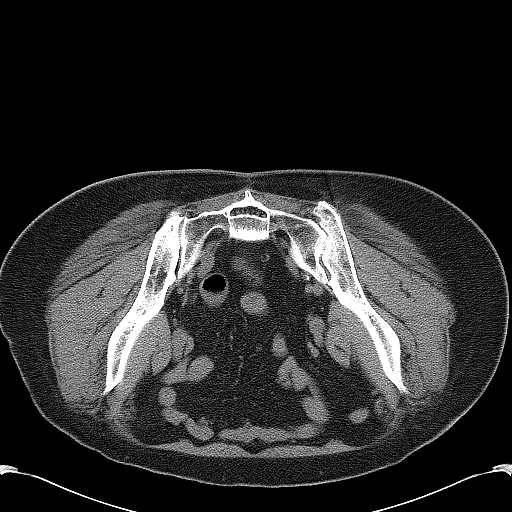
[im 9/24  soft-tissue]
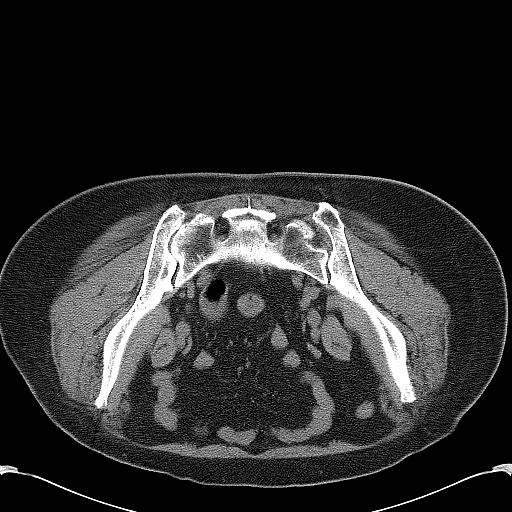
[im 11/24  soft-tissue]
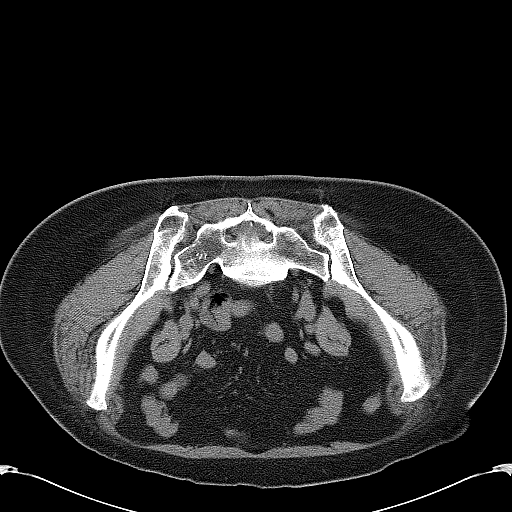
[im 12/24  soft-tissue]
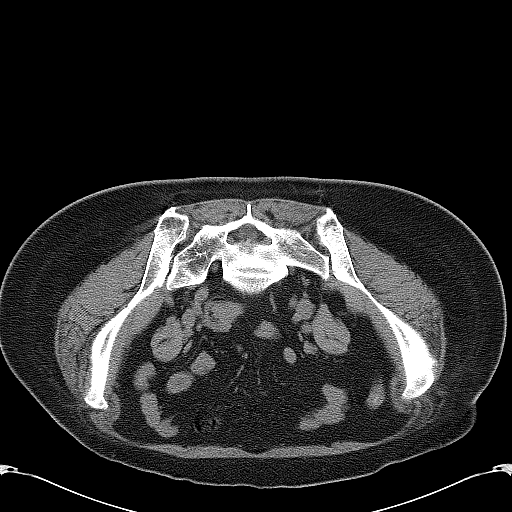
[im 13/24  soft-tissue]
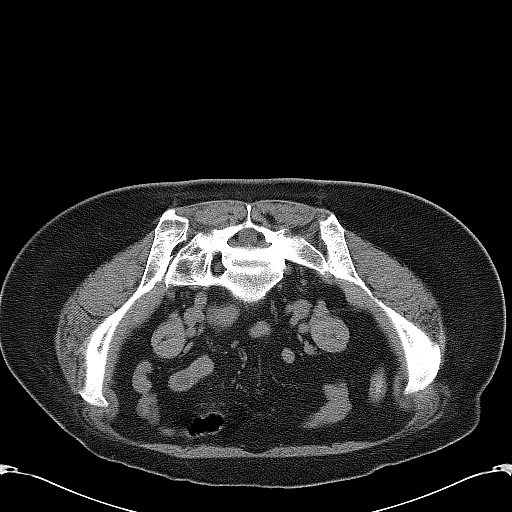
[im 15/24  soft-tissue]
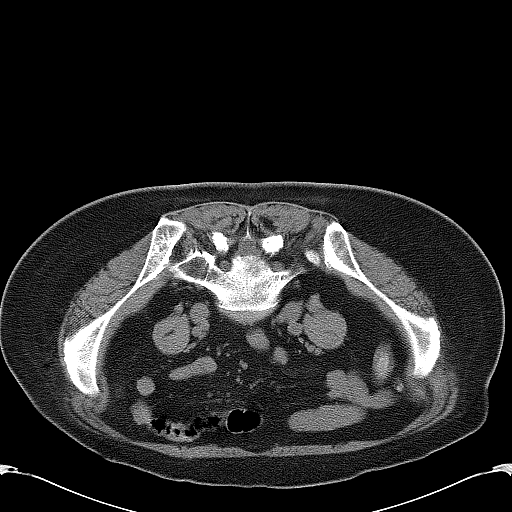
[im 15/24  bone]
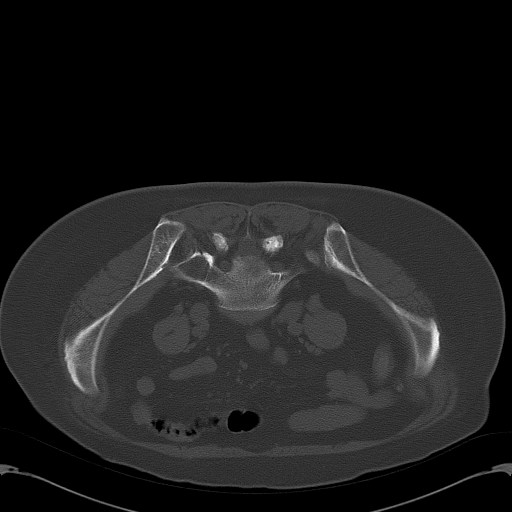
[im 17/24  soft-tissue]
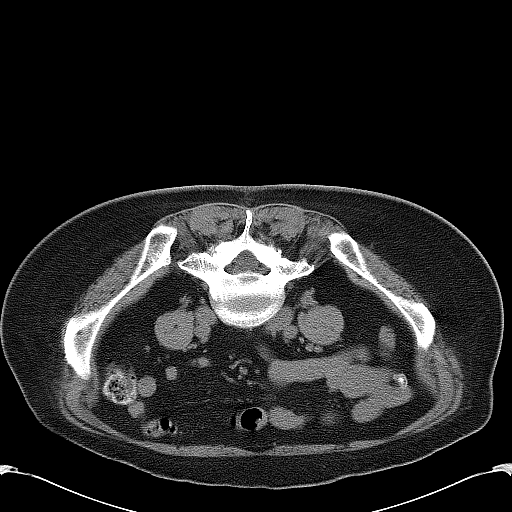
[im 19/24  soft-tissue]
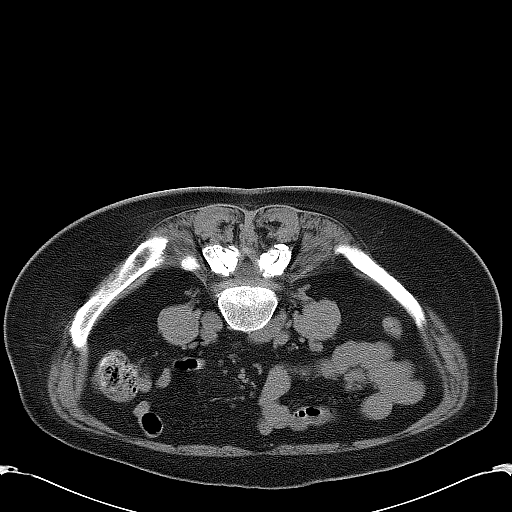
[im 19/24  lung]
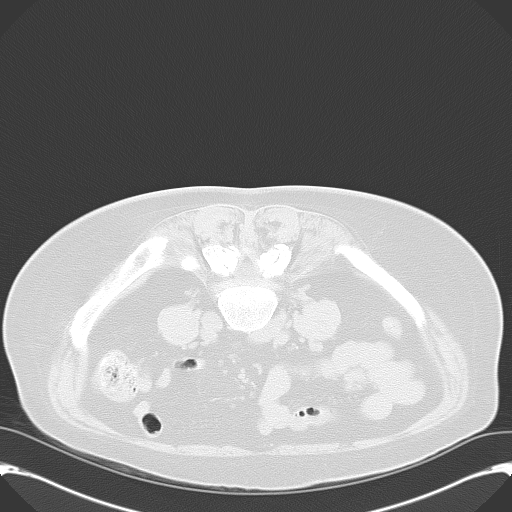
[im 20/24  soft-tissue]
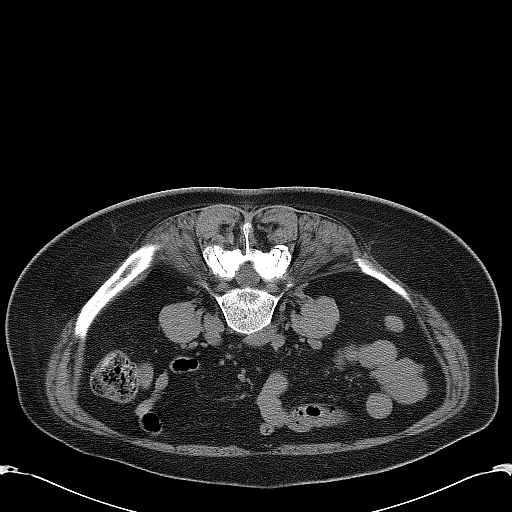
[im 20/24  lung]
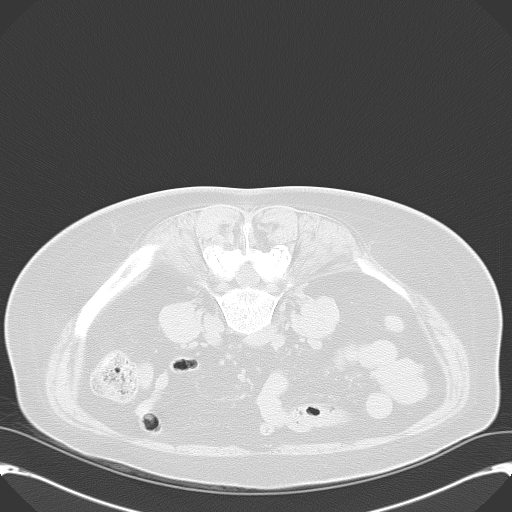
[im 21/24  lung]
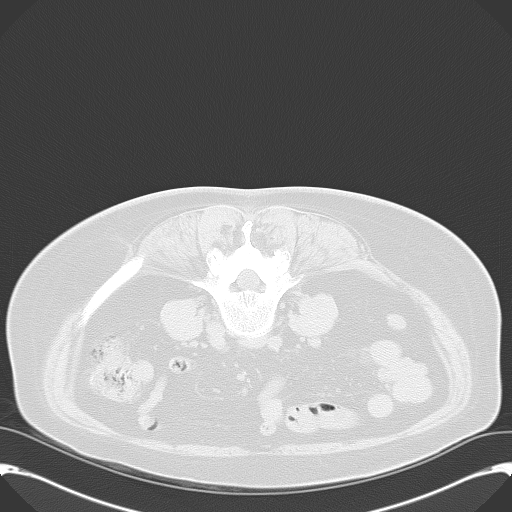
[im 22/24  soft-tissue]
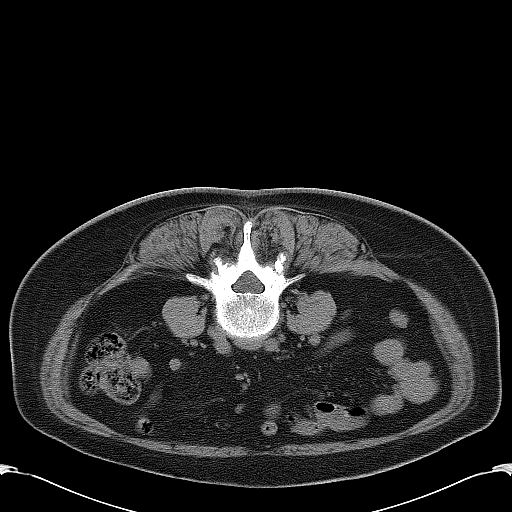
[im 22/24  lung]
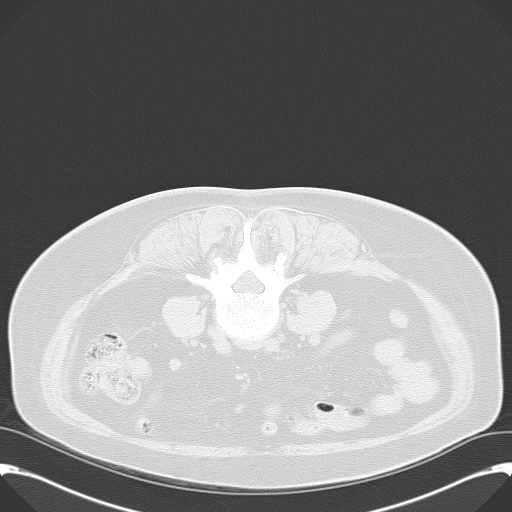

[14 of 32 positions shown; findings below may reference images not displayed]

CT GUIDED ASPIRATE AND CORE BIOPSY OF RIGHT ILIAC BONE MARROW

Sedation: Versed 2.0 mg IV, Fentanyl 100 mcg IV

Total Moderate Sedation Time: 15 minutes.

Procedure:  The procedure risks, benefits, and alternatives were
explained to the patient.  Questions regarding the procedure were
encouraged and answered.  The patient understands and consents to
the procedure.

The right gluteal region was prepped with Betadine.  Sterile gown
and sterile gloves were used for the procedure.  Local anesthesia
was provided with 1% Lidocaine.

Under CT guidance, an OnControl 11 gauge bone cutting needle was
advanced from a posterior approach into the right iliac bone.
Needle positioning was confirmed with CT.  Initial non heparinized
and heparinized aspirate samples were obtained of bone marrow.

Core biopsy was performed with the outer needle.

Complications: None
FINDINGS: Inspection of initial aspirate did reveal visible
particles.  Intact core biopsy sample was obtained.
IMPRESSION: CT guided bone marrow biopsy of right posterior iliac bone with
both aspirate and core samples obtained.

## 2014-11-30 DIAGNOSIS — E119 Type 2 diabetes mellitus without complications: Secondary | ICD-10-CM | POA: Diagnosis not present

## 2014-11-30 LAB — HM DIABETES EYE EXAM

## 2014-12-09 ENCOUNTER — Other Ambulatory Visit: Payer: Self-pay | Admitting: Internal Medicine

## 2014-12-10 NOTE — Telephone Encounter (Signed)
rx sent to pharmacy by e-script  

## 2014-12-17 ENCOUNTER — Encounter: Payer: Self-pay | Admitting: Internal Medicine

## 2015-02-26 ENCOUNTER — Other Ambulatory Visit (HOSPITAL_BASED_OUTPATIENT_CLINIC_OR_DEPARTMENT_OTHER): Payer: Medicare Other

## 2015-02-26 ENCOUNTER — Ambulatory Visit (HOSPITAL_BASED_OUTPATIENT_CLINIC_OR_DEPARTMENT_OTHER): Payer: Medicare Other | Admitting: Hematology and Oncology

## 2015-02-26 ENCOUNTER — Telehealth: Payer: Self-pay | Admitting: Hematology and Oncology

## 2015-02-26 ENCOUNTER — Encounter: Payer: Self-pay | Admitting: Hematology and Oncology

## 2015-02-26 VITALS — BP 131/60 | HR 87 | Temp 98.4°F | Resp 18 | Ht 64.0 in | Wt 162.4 lb

## 2015-02-26 DIAGNOSIS — D630 Anemia in neoplastic disease: Secondary | ICD-10-CM | POA: Diagnosis not present

## 2015-02-26 DIAGNOSIS — D473 Essential (hemorrhagic) thrombocythemia: Secondary | ICD-10-CM

## 2015-02-26 LAB — CBC WITH DIFFERENTIAL/PLATELET
BASO%: 1 % (ref 0.0–2.0)
Basophils Absolute: 0.1 10*3/uL (ref 0.0–0.1)
EOS%: 1.5 % (ref 0.0–7.0)
Eosinophils Absolute: 0.1 10*3/uL (ref 0.0–0.5)
HCT: 32.6 % — ABNORMAL LOW (ref 34.8–46.6)
HEMOGLOBIN: 11 g/dL — AB (ref 11.6–15.9)
LYMPH%: 43.4 % (ref 14.0–49.7)
MCH: 34.2 pg — ABNORMAL HIGH (ref 25.1–34.0)
MCHC: 33.8 g/dL (ref 31.5–36.0)
MCV: 101.1 fL — AB (ref 79.5–101.0)
MONO#: 0.5 10*3/uL (ref 0.1–0.9)
MONO%: 8.6 % (ref 0.0–14.0)
NEUT%: 45.5 % (ref 38.4–76.8)
NEUTROS ABS: 2.6 10*3/uL (ref 1.5–6.5)
Platelets: 546 10*3/uL — ABNORMAL HIGH (ref 145–400)
RBC: 3.22 10*6/uL — AB (ref 3.70–5.45)
RDW: 12.4 % (ref 11.2–14.5)
WBC: 5.7 10*3/uL (ref 3.9–10.3)
lymph#: 2.5 10*3/uL (ref 0.9–3.3)

## 2015-02-26 MED ORDER — HYDROXYUREA 500 MG PO CAPS: 500.0000 mg | ORAL_CAPSULE | Freq: Every day | ORAL | Status: DC

## 2015-02-26 NOTE — Telephone Encounter (Signed)
lvm for pt regarding to March 2017 appt.... °

## 2015-02-26 NOTE — Progress Notes (Signed)
[facility] OFFICE PROGRESS NOTE  Patient Care Team: [name], MD as PCP - General W Delene Loll, MD as Attending Physician (Obstetrics and Gynecology) Heath Lark, MD as Consulting Physician (Hematology and Oncology)  SUMMARY OF ONCOLOGIC HISTORY:  This patient used to be a blood donor. She was refused blood donation for several years due to anemia. A review of her CBC dated back to 2012 in note of mild anemia with associated thrombocytosis. In May of this year, she underwent extensive evaluation. Iron studies show that she had borderline iron deficiency. In June 2014, she had a bone marrow aspirate and biopsy. Results are inconclusive but there are presence of atypical megakaryocytes. Cytogenetics a study was normal. The patient was started on hydroxyurea. MPL mutation, BCR/ABL and JAK2 mutation testing was negative In December 2014, she had EGD and colonoscopy for evaluation of iron deficiency anemia and the tests were negative apart from mild nonspecific gastritis. She was placed on iron supplements, subsequently discontinued in January 2015 On 01/08/2014, dose of hydroxyurea was modified to. She is instructed to take 6 days a week and skip on Sundays. Since September 2015, she reduce hydroxyurea by skipping doses on Wednesday and Sundays On 05/04/2014, I reduce hydroxyurea dose further to 500 mg 4 days a week and to add anagrelide. Due to difficulties obtaining the pills, she had not been able to take hydroxyurea and anagrelide. On 06/04/14: dose of hydroxyurea is increased to 500 mg daily.  INTERVAL HISTORY: Please see below for problem oriented charting. She feels well. Denies recent infection. Known recent diagnosis of blood clot. Her energy level is fair and she continues to work full-time. The patient denies any recent signs or symptoms of bleeding such as spontaneous epistaxis, hematuria or hematochezia.   REVIEW OF SYSTEMS:   Constitutional: Denies  fevers, chills or abnormal weight loss Eyes: Denies blurriness of vision Ears, nose, mouth, throat, and face: Denies mucositis or sore throat Respiratory: Denies cough, dyspnea or wheezes Cardiovascular: Denies palpitation, chest discomfort or lower extremity swelling Gastrointestinal:  Denies nausea, heartburn or change in bowel habits Skin: Denies abnormal skin rashes Lymphatics: Denies new lymphadenopathy or easy bruising Neurological:Denies numbness, tingling or new weaknesses Behavioral/Psych: Mood is stable, no new changes  All other systems were reviewed with the patient and are negative.  I have reviewed the past medical history, past surgical history, social history and family history with the patient and they are unchanged from previous note.  ALLERGIES:  has No Known Allergies.  MEDICATIONS:  Current Outpatient Prescriptions  Medication Sig Dispense Refill  . Ascorbic Acid (VITAMIN C) 500 MG tablet Take 500 mg by mouth daily.      Marland Kitchen aspirin 81 MG tablet Take 81 mg by mouth daily.      . calcium-vitamin D (OSCAL) 250-125 MG-UNIT per tablet Take 1 tablet by mouth daily.      . cetirizine (ZYRTEC) 10 MG tablet Take 10 mg by mouth daily as needed for allergies.     . fish oil-omega-3 fatty acids 1000 MG capsule Take 1 g by mouth daily.     Marland Kitchen glipiZIDE (GLUCOTROL) 5 MG tablet TAKE ONE TABLET BY MOUTH TWICE DAILY BEFORE MEALS 180 tablet 3  . hydroxyurea (HYDREA) 500 MG capsule Take 1 capsule (500 mg total) by mouth daily. 90 capsule 3  . JANUVIA 100 MG tablet TAKE ONE TABLET BY MOUTH ONCE DAILY 90 tablet 3  . metFORMIN (GLUCOPHAGE) 1000 MG tablet TAKE ONE TABLET BY MOUTH TWICE  DAILY WITH MEALS 180 tablet 3  . Multiple Vitamin (MULTIVITAMIN) tablet Take 1 tablet by mouth daily.      Marland Kitchen omeprazole (PRILOSEC OTC) 20 MG tablet Take 20 mg by mouth daily.     . simvastatin (ZOCOR) 10 MG tablet TAKE ONE TABLET BY MOUTH ONCE DAILY 90 tablet 3   No current facility-administered medications  for this visit.    PHYSICAL EXAMINATION: ECOG PERFORMANCE STATUS: 0 - Asymptomatic  Filed Vitals:   02/26/15 1402  BP: 131/60  Pulse: 87  Temp: 98.4 F (36.9 C)  Resp: 18   Filed Weights   02/26/15 1402  Weight: 162 lb 6.4 oz (73.664 kg)    GENERAL:alert, no distress and comfortable SKIN: skin color, texture, turgor are normal, no rashes or significant lesions EYES: normal, Conjunctiva are pink and non-injected, sclera clear Musculoskeletal:no cyanosis of digits and no clubbing  NEURO: alert & oriented x 3 with fluent speech, no focal motor/sensory deficits  LABORATORY DATA:  I have reviewed the data as listed    Component Value Date/Time   NA 136 02/16/2014 1231   NA 136 03/31/2013 1414   K 4.4 02/16/2014 1231   K 4.0 03/31/2013 1414   CL 100 02/16/2014 1231   CO2 24 02/16/2014 1231   CO2 24 03/31/2013 1414   GLUCOSE 86 02/16/2014 1231   GLUCOSE 178* 03/31/2013 1414   BUN 6 02/16/2014 1231   BUN 8.1 03/31/2013 1414   CREATININE 0.6 02/16/2014 1231   CREATININE 0.7 03/31/2013 1414   CALCIUM 9.7 02/16/2014 1231   CALCIUM 10.1 03/31/2013 1414   PROT 7.8 02/16/2014 1231   PROT 7.2 03/31/2013 1414   ALBUMIN 4.2 02/16/2014 1231   ALBUMIN 3.9 03/31/2013 1414   AST 21 02/16/2014 1231   AST 19 03/31/2013 1414   ALT 16 02/16/2014 1231   ALT 15 03/31/2013 1414   ALKPHOS 44 02/16/2014 1231   ALKPHOS 55 03/31/2013 1414   BILITOT 0.5 02/16/2014 1231   BILITOT 0.28 03/31/2013 1414   GFRNONAA 112.68 10/29/2009 1221    No results found for: SPEP, UPEP  Lab Results  Component Value Date   WBC 5.7 02/26/2015   NEUTROABS 2.6 02/26/2015   HGB 11.0* 02/26/2015   HCT 32.6* 02/26/2015   MCV 101.1* 02/26/2015   PLT 546* 02/26/2015      Chemistry      Component Value Date/Time   NA 136 02/16/2014 1231   NA 136 03/31/2013 1414   K 4.4 02/16/2014 1231   K 4.0 03/31/2013 1414   CL 100 02/16/2014 1231   CO2 24 02/16/2014 1231   CO2 24 03/31/2013 1414   BUN 6  02/16/2014 1231   BUN 8.1 03/31/2013 1414   CREATININE 0.6 02/16/2014 1231   CREATININE 0.7 03/31/2013 1414      Component Value Date/Time   CALCIUM 9.7 02/16/2014 1231   CALCIUM 10.1 03/31/2013 1414   ALKPHOS 44 02/16/2014 1231   ALKPHOS 55 03/31/2013 1414   AST 21 02/16/2014 1231   AST 19 03/31/2013 1414   ALT 16 02/16/2014 1231   ALT 15 03/31/2013 1414   BILITOT 0.5 02/16/2014 1231   BILITOT 0.28 03/31/2013 1414      ASSESSMENT & PLAN:  Essential thrombocythemia She is doing well. She was not able to get anagrelide approved/filled. Even though her platelet count is suboptimally controlled, the patient is not symptomatic and has never been diagnosed with blood clot. There is no concurrent leukocytosis. She has evidence of anemia and  I am reluctant to increase the dose of hydroxyurea further. I recommend she continues taking hydroxyurea 1 tablet daily without change in dose. I will see her back in 4 months for further follow-up.  Anemia in neoplastic disease This is likely due to recent treatment. The patient denies recent history of bleeding such as epistaxis, hematuria or hematochezia. She is asymptomatic from the anemia. I will observe for now.    No orders of the defined types were placed in this encounter.   All questions were answered. The patient knows to call the clinic with any problems, questions or concerns. No barriers to learning was detected. I spent 15 minutes counseling the patient face to face. The total time spent in the appointment was 20 minutes and more than 50% was on counseling and review of test results     [facility], Marlyss Cissell, MD 02/26/2015 2:17 PM

## 2015-02-26 NOTE — Assessment & Plan Note (Addendum)
She is doing well. She was not able to get anagrelide approved/filled. Even though her platelet count is suboptimally controlled, the patient is not symptomatic and has never been diagnosed with blood clot. There is no concurrent leukocytosis. She has evidence of anemia and I am reluctant to increase the dose of hydroxyurea further. I recommend she continues taking hydroxyurea 1 tablet daily without change in dose. I will see her back in 4 months for further follow-up.

## 2015-02-26 NOTE — Assessment & Plan Note (Signed)
This is likely due to recent treatment. The patient denies recent history of bleeding such as epistaxis, hematuria or hematochezia. She is asymptomatic from the anemia. I will observe for now.   

## 2015-04-02 ENCOUNTER — Encounter: Payer: Self-pay | Admitting: Internal Medicine

## 2015-04-02 ENCOUNTER — Ambulatory Visit (INDEPENDENT_AMBULATORY_CARE_PROVIDER_SITE_OTHER): Payer: Medicare Other | Admitting: Internal Medicine

## 2015-04-02 VITALS — BP 138/80 | HR 99 | Temp 97.9°F | Ht 64.0 in | Wt 162.0 lb

## 2015-04-02 DIAGNOSIS — I471 Supraventricular tachycardia: Secondary | ICD-10-CM | POA: Diagnosis not present

## 2015-04-02 DIAGNOSIS — D473 Essential (hemorrhagic) thrombocythemia: Secondary | ICD-10-CM

## 2015-04-02 DIAGNOSIS — Z0000 Encounter for general adult medical examination without abnormal findings: Secondary | ICD-10-CM | POA: Diagnosis not present

## 2015-04-02 DIAGNOSIS — Z7189 Other specified counseling: Secondary | ICD-10-CM

## 2015-04-02 DIAGNOSIS — Z23 Encounter for immunization: Secondary | ICD-10-CM | POA: Diagnosis not present

## 2015-04-02 DIAGNOSIS — Z780 Asymptomatic menopausal state: Secondary | ICD-10-CM

## 2015-04-02 DIAGNOSIS — K219 Gastro-esophageal reflux disease without esophagitis: Secondary | ICD-10-CM | POA: Diagnosis not present

## 2015-04-02 DIAGNOSIS — E119 Type 2 diabetes mellitus without complications: Secondary | ICD-10-CM | POA: Diagnosis not present

## 2015-04-02 LAB — COMPREHENSIVE METABOLIC PANEL
ALT: 23 U/L (ref 0–35)
AST: 21 U/L (ref 0–37)
Albumin: 4.1 g/dL (ref 3.5–5.2)
Alkaline Phosphatase: 47 U/L (ref 39–117)
BILIRUBIN TOTAL: 0.3 mg/dL (ref 0.2–1.2)
BUN: 11 mg/dL (ref 6–23)
CO2: 28 meq/L (ref 19–32)
Calcium: 9.9 mg/dL (ref 8.4–10.5)
Chloride: 101 mEq/L (ref 96–112)
Creatinine, Ser: 0.7 mg/dL (ref 0.40–1.20)
GFR: 89.12 mL/min (ref >60.00)
Glucose, Bld: 167 mg/dL — ABNORMAL HIGH (ref 70–99)
POTASSIUM: 4.4 meq/L (ref 3.5–5.1)
Sodium: 135 mEq/L (ref 135–145)
TOTAL PROTEIN: 7 g/dL (ref 6.0–8.3)

## 2015-04-02 LAB — MICROALBUMIN / CREATININE URINE RATIO
Creatinine,U: 100.4 mg/dL
MICROALB/CREAT RATIO: 0.8 mg/g (ref 0.0–30.0)
Microalb, Ur: 0.8 mg/dL (ref 0.0–1.9)

## 2015-04-02 LAB — LIPID PANEL
Cholesterol: 137 mg/dL (ref 0–200)
HDL: 52.5 mg/dL (ref >39.00)
LDL Cholesterol: 63 mg/dL (ref 0–99)
NONHDL: 84.74
TRIGLYCERIDES: 108 mg/dL (ref 0.0–149.0)
Total CHOL/HDL Ratio: 3
VLDL: 21.6 mg/dL (ref 0.0–40.0)

## 2015-04-02 LAB — T4, FREE: Free T4: 0.83 ng/dL (ref 0.60–1.60)

## 2015-04-02 LAB — HEMOGLOBIN A1C: Hgb A1c MFr Bld: 8.3 % — ABNORMAL HIGH (ref 4.6–6.5)

## 2015-04-02 NOTE — Assessment & Plan Note (Signed)
Hopefully still acceptable control Will give 3 months if over 8%

## 2015-04-02 NOTE — Assessment & Plan Note (Signed)
See social history Blank forms given 

## 2015-04-02 NOTE — Progress Notes (Signed)
Subjective:    Patient ID: Savannah Evans, female    DOB: 30-Nov-1950, 64 y.o.   MRN: [id number]  HPI Here for welcome to Medicare visit and follow up of chronic medical conditions Reviewed form and advanced directives Reviewed other doctors No alcohol or tobacco Still working full time No exercise---discussed Vision and hearing are fine No falls No depression or anhedonia Independent in instrumental ADLs No apparent cognitive problems  Gyn retired Didn't want to get another physician Discussed that paps not needed now and mammo every other year  Feels good Mild anemia so not increasing hydroxyurea Follows with Dr Alvy Bimler  Checks sugars once a week or so Usually under 150--unless she cheats No hypoglycemic reactions No sores, numbness or pain in feet  Takes the omeprazole Controls heartburn No swallowing problems  Lots of drainage Mostly in AM Regular with her allergy meds  No palpitations recently No chest pain or SOB No dizziness or syncope  Current Outpatient Prescriptions on File Prior to Visit  Medication Sig Dispense Refill  . Ascorbic Acid (VITAMIN C) 500 MG tablet Take 500 mg by mouth daily.      Marland Kitchen aspirin 81 MG tablet Take 81 mg by mouth daily.      . calcium-vitamin D (OSCAL) 250-125 MG-UNIT per tablet Take 1 tablet by mouth daily.      . cetirizine (ZYRTEC) 10 MG tablet Take 10 mg by mouth daily as needed for allergies.     . fish oil-omega-3 fatty acids 1000 MG capsule Take 1 g by mouth daily.     Marland Kitchen glipiZIDE (GLUCOTROL) 5 MG tablet TAKE ONE TABLET BY MOUTH TWICE DAILY BEFORE MEALS 180 tablet 3  . hydroxyurea (HYDREA) 500 MG capsule Take 1 capsule (500 mg total) by mouth daily. 90 capsule 3  . JANUVIA 100 MG tablet TAKE ONE TABLET BY MOUTH ONCE DAILY 90 tablet 3  . metFORMIN (GLUCOPHAGE) 1000 MG tablet TAKE ONE TABLET BY MOUTH TWICE DAILY WITH MEALS 180 tablet 3  . Multiple Vitamin (MULTIVITAMIN) tablet Take 1 tablet by mouth daily.      Marland Kitchen  omeprazole (PRILOSEC OTC) 20 MG tablet Take 20 mg by mouth daily.     . simvastatin (ZOCOR) 10 MG tablet TAKE ONE TABLET BY MOUTH ONCE DAILY 90 tablet 3   No current facility-administered medications on file prior to visit.    No Known Allergies  Past Medical History  Diagnosis Date  . Allergic rhinitis   . Diabetes mellitus type 2, noninsulin dependent ([place])   . GERD (gastroesophageal reflux disease)   . Hyperlipidemia   . SVT (supraventricular tachycardia) ([place])   . Osteoarthritis   . Diaphragmatic hernia   . Iron deficiency   . Essential thrombocythemia ([place]) 10/21/2012    JAK2 neg; BCR-ABL neg; bone marrow biopsy on 09/29/12 showed normal cytogenetics.   . Anemia, unspecified 03/03/2013  . History of palpitations     Past Surgical History  Procedure Laterality Date  . Svt ablation  2000    Dr. Caryl Comes  . Colonoscopy      neg age 55.  Next age 62.     Family History  Problem Relation Age of Onset  . Uterine cancer Sister   . Diabetes Sister   . Macular degeneration Mother   . Heart attack Father   . Cancer Paternal Aunt     cancer?  . Cancer Paternal Uncle     cancer?  . Colon cancer Neg Hx     Social  History   Social History  . Marital Status: Married    Spouse Name: N/A  . Number of Children: 3  . Years of Education: N/A   Occupational History  . Manages loan administration area for [employer]    Social History Main Topics  . Smoking status: Never Smoker   . Smokeless tobacco: Never Used  . Alcohol Use: 0.6 oz/week    1 Glasses of wine per week     Comment: social  . Drug Use: No  . Sexual Activity: Not on file   Other Topics Concern  . Not on file   Social History Narrative   Has living will   Requests husband as health care POA   Would accept resuscitation attempts   No tube feeds if cognitively unaware   Review of Systems Appetite is good Weight is stable Sleeps well Needs implant for broken crown No sig back or joint pain No  skin problems or suspicious lesions    Objective:   Physical Exam  Constitutional: She is oriented to person, place, and time. She appears well-developed and well-nourished. No distress.  HENT:  Mouth/Throat: Oropharynx is clear and moist. No oropharyngeal exudate.  Neck: Normal range of motion. Neck supple. No thyromegaly present.  Cardiovascular: Normal rate, regular rhythm, normal heart sounds and intact distal pulses.  Exam reveals no gallop.   No murmur heard. Pulmonary/Chest: Effort normal and breath sounds normal. No respiratory distress. She has no wheezes. She has no rales.  Abdominal: Soft. There is no tenderness.  Musculoskeletal: She exhibits no edema or tenderness.  Lymphadenopathy:    She has no cervical adenopathy.  Neurological: She is alert and oriented to person, place, and time.  President--- "Obama, [place]" [phone] [id number] Recall 3/3  Normal sensation in plantar feet  Skin: No rash noted. No erythema.  No foot lesions  Psychiatric: She has a normal mood and affect. Her behavior is normal.          Assessment & Plan:

## 2015-04-02 NOTE — Assessment & Plan Note (Signed)
Tolerating hydroxyurea Follows with Dr Alvy Bimler

## 2015-04-02 NOTE — Assessment & Plan Note (Signed)
Quiet on the PPI 

## 2015-04-02 NOTE — Assessment & Plan Note (Signed)
No recent paroxysms No Rx

## 2015-04-02 NOTE — Addendum Note (Signed)
Addended by: Despina Hidden on: 04/02/2015 10:13 AM   Modules accepted: Orders

## 2015-04-02 NOTE — Progress Notes (Signed)
Pre visit review using our clinic review tool, if applicable. No additional management support is needed unless otherwise documented below in the visit note. 

## 2015-04-02 NOTE — Addendum Note (Signed)
Addended by: Marchia Bond on: 04/02/2015 03:49 PM   Modules accepted: Miquel Dunn

## 2015-04-02 NOTE — Assessment & Plan Note (Signed)
I have personally reviewed the Medicare Annual Wellness questionnaire and have noted 1. The patient's medical and social history 2. Their use of alcohol, tobacco or illicit drugs 3. Their current medications and supplements 4. The patient's functional ability including ADL's, fall risks, home safety risks and hearing or visual             impairment. 5. Diet and physical activities 6. Evidence for depression or mood disorders  The patients weight, height, BMI and visual acuity have been recorded in the chart I have made referrals, counseling and provided education to the patient based review of the above and I have provided the pt with a written personalized care plan for preventive services.  I have provided you with a copy of your personalized plan for preventive services. Please take the time to review along with your updated medication list.  prevnar and flu vaccines today Pneumovax booster next year Colon due 2024 mammo due 2017 No more PAPs  Check DEXA

## 2015-04-02 NOTE — Patient Instructions (Signed)
Your mammogram is due in August 2017.

## 2015-04-03 ENCOUNTER — Other Ambulatory Visit: Payer: Self-pay | Admitting: Internal Medicine

## 2015-04-03 DIAGNOSIS — E119 Type 2 diabetes mellitus without complications: Secondary | ICD-10-CM

## 2015-04-05 ENCOUNTER — Encounter: Payer: Self-pay | Admitting: *Deleted

## 2015-05-26 ENCOUNTER — Ambulatory Visit
Admission: RE | Admit: 2015-05-26 | Discharge: 2015-05-26 | Disposition: A | Discharge status: Home / Self Care | Payer: Medicare Other | Source: Ambulatory Visit | Attending: Internal Medicine | Admitting: Internal Medicine

## 2015-05-26 DIAGNOSIS — M810 Age-related osteoporosis without current pathological fracture: Secondary | ICD-10-CM | POA: Diagnosis not present

## 2015-05-26 DIAGNOSIS — Z780 Asymptomatic menopausal state: Secondary | ICD-10-CM | POA: Diagnosis not present

## 2015-07-21 ENCOUNTER — Telehealth: Payer: Self-pay

## 2015-07-21 NOTE — Telephone Encounter (Signed)
Per Dr Silvio Pate, Please have her come in for the repeat A1c that was supposed to be done

## 2015-07-21 NOTE — Telephone Encounter (Signed)
Left a message on voice mail at home, per DPR, asking patient to call me back to schedule A1C

## 2015-07-23 ENCOUNTER — Other Ambulatory Visit (HOSPITAL_BASED_OUTPATIENT_CLINIC_OR_DEPARTMENT_OTHER): Payer: Medicare Other

## 2015-07-23 ENCOUNTER — Telehealth: Payer: Self-pay | Admitting: Hematology and Oncology

## 2015-07-23 ENCOUNTER — Encounter: Payer: Self-pay | Admitting: Hematology and Oncology

## 2015-07-23 ENCOUNTER — Ambulatory Visit (HOSPITAL_BASED_OUTPATIENT_CLINIC_OR_DEPARTMENT_OTHER): Payer: Medicare Other | Admitting: Hematology and Oncology

## 2015-07-23 VITALS — BP 135/59 | HR 88 | Temp 98.7°F | Resp 18 | Ht 64.0 in | Wt 162.9 lb

## 2015-07-23 DIAGNOSIS — D473 Essential (hemorrhagic) thrombocythemia: Secondary | ICD-10-CM | POA: Diagnosis not present

## 2015-07-23 DIAGNOSIS — D630 Anemia in neoplastic disease: Secondary | ICD-10-CM

## 2015-07-23 LAB — CBC WITH DIFFERENTIAL/PLATELET
BASO%: 0.8 % (ref 0.0–2.0)
Basophils Absolute: 0.1 10*3/uL (ref 0.0–0.1)
EOS%: 2.6 % (ref 0.0–7.0)
Eosinophils Absolute: 0.2 10*3/uL (ref 0.0–0.5)
HCT: 32.7 % — ABNORMAL LOW (ref 34.8–46.6)
HGB: 11 g/dL — ABNORMAL LOW (ref 11.6–15.9)
LYMPH#: 2.7 10*3/uL (ref 0.9–3.3)
LYMPH%: 40.9 % (ref 14.0–49.7)
MCH: 33.7 pg (ref 25.1–34.0)
MCHC: 33.6 g/dL (ref 31.5–36.0)
MCV: 100.6 fL (ref 79.5–101.0)
MONO#: 0.6 10*3/uL (ref 0.1–0.9)
MONO%: 8.6 % (ref 0.0–14.0)
NEUT#: 3.1 10*3/uL (ref 1.5–6.5)
NEUT%: 47.1 % (ref 38.4–76.8)
Platelets: 508 10*3/uL — ABNORMAL HIGH (ref 145–400)
RBC: 3.25 10*6/uL — AB (ref 3.70–5.45)
RDW: 12.4 % (ref 11.2–14.5)
WBC: 6.6 10*3/uL (ref 3.9–10.3)

## 2015-07-23 NOTE — Telephone Encounter (Signed)
Left a detailed message, per DPR, to call the office and schedule a lab visit for her repeat A1C.

## 2015-07-23 NOTE — Assessment & Plan Note (Signed)
She is doing well. She was not able to get anagrelide approved/filled. Even though her platelet count is suboptimally controlled, the patient is not symptomatic and has never been diagnosed with blood clot. There is no concurrent leukocytosis. She has evidence of anemia and I am reluctant to increase the dose of hydroxyurea further. I recommend she continues taking hydroxyurea 1 tablet daily without change in dose. I will see her back in 6 months for further follow-up. 

## 2015-07-23 NOTE — Progress Notes (Signed)
[facility] OFFICE PROGRESS NOTE  Patient Care Team: [name], MD as PCP - General W Delene Loll, MD as Attending Physician (Obstetrics and Gynecology) Heath Lark, MD as Consulting Physician (Hematology and Oncology)  SUMMARY OF ONCOLOGIC HISTORY:  This patient used to be a blood donor. She was refused blood donation for several years due to anemia. A review of her CBC dated back to 2012 in note of mild anemia with associated thrombocytosis. In May of this year, she underwent extensive evaluation. Iron studies show that she had borderline iron deficiency. In June 2014, she had a bone marrow aspirate and biopsy. Results are inconclusive but there are presence of atypical megakaryocytes. Cytogenetics a study was normal. The patient was started on hydroxyurea. MPL mutation, BCR/ABL and JAK2 mutation testing was negative In December 2014, she had EGD and colonoscopy for evaluation of iron deficiency anemia and the tests were negative apart from mild nonspecific gastritis. She was placed on iron supplements, subsequently discontinued in January 2015 On 01/08/2014, dose of hydroxyurea was modified to. She is instructed to take 6 days a week and skip on Sundays. Since September 2015, she reduce hydroxyurea by skipping doses on Wednesday and Sundays On 05/04/2014, I reduce hydroxyurea dose further to 500 mg 4 days a week and to add anagrelide. Due to difficulties obtaining the pills, she had not been able to take hydroxyurea and anagrelide. On 06/04/14: dose of hydroxyurea is increased to 500 mg daily.  INTERVAL HISTORY: Please see below for problem oriented charting. She feels well. Denies recent infection. She denies signs and symptoms of anemia. The patient denies any recent signs or symptoms of bleeding such as spontaneous epistaxis, hematuria or hematochezia.   REVIEW OF SYSTEMS:   Constitutional: Denies fevers, chills or abnormal weight loss Eyes: Denies blurriness of  vision Ears, nose, mouth, throat, and face: Denies mucositis or sore throat Respiratory: Denies cough, dyspnea or wheezes Cardiovascular: Denies palpitation, chest discomfort or lower extremity swelling Gastrointestinal:  Denies nausea, heartburn or change in bowel habits Skin: Denies abnormal skin rashes Lymphatics: Denies new lymphadenopathy or easy bruising Neurological:Denies numbness, tingling or new weaknesses Behavioral/Psych: Mood is stable, no new changes  All other systems were reviewed with the patient and are negative.  I have reviewed the past medical history, past surgical history, social history and family history with the patient and they are unchanged from previous note.  ALLERGIES:  has No Known Allergies.  MEDICATIONS:  Current Outpatient Prescriptions  Medication Sig Dispense Refill  . Ascorbic Acid (VITAMIN C) 500 MG tablet Take 500 mg by mouth daily.      Marland Kitchen aspirin 81 MG tablet Take 81 mg by mouth daily.      . calcium-vitamin D (OSCAL) 250-125 MG-UNIT per tablet Take 1 tablet by mouth daily.      . cetirizine (ZYRTEC) 10 MG tablet Take 10 mg by mouth daily as needed for allergies.     . fish oil-omega-3 fatty acids 1000 MG capsule Take 1 g by mouth daily.     Marland Kitchen glipiZIDE (GLUCOTROL) 5 MG tablet TAKE ONE TABLET BY MOUTH TWICE DAILY BEFORE MEALS 180 tablet 3  . hydroxyurea (HYDREA) 500 MG capsule Take 1 capsule (500 mg total) by mouth daily. 90 capsule 3  . JANUVIA 100 MG tablet TAKE ONE TABLET BY MOUTH ONCE DAILY 90 tablet 3  . metFORMIN (GLUCOPHAGE) 1000 MG tablet TAKE ONE TABLET BY MOUTH TWICE DAILY WITH MEALS 180 tablet 3  . Multiple Vitamin (  MULTIVITAMIN) tablet Take 1 tablet by mouth daily.      Marland Kitchen omeprazole (PRILOSEC OTC) 20 MG tablet Take 20 mg by mouth daily.     . simvastatin (ZOCOR) 10 MG tablet TAKE ONE TABLET BY MOUTH ONCE DAILY 90 tablet 3   No current facility-administered medications for this visit.    PHYSICAL EXAMINATION: ECOG PERFORMANCE  STATUS: 0 - Asymptomatic  Filed Vitals:   07/23/15 1353  BP: 135/59  Pulse: 88  Temp: 98.7 F (37.1 C)  Resp: 18   Filed Weights   07/23/15 1353  Weight: 162 lb 14.4 oz (73.891 kg)    GENERAL:alert, no distress and comfortable SKIN: skin color, texture, turgor are normal, no rashes or significant lesions EYES: normal, Conjunctiva are pink and non-injected, sclera clear OROPHARYNX:no exudate, no erythema and lips, buccal mucosa, and tongue normal  NECK: supple, thyroid normal size, non-tender, without nodularity LYMPH:  no palpable lymphadenopathy in the cervical, axillary or inguinal LUNGS: clear to auscultation and percussion with normal breathing effort HEART: regular rate & rhythm and no murmurs and no lower extremity edema ABDOMEN:abdomen soft, non-tender and normal bowel sounds Musculoskeletal:no cyanosis of digits and no clubbing  NEURO: alert & oriented x 3 with fluent speech, no focal motor/sensory deficits  LABORATORY DATA:  I have reviewed the data as listed    Component Value Date/Time   NA 135 04/02/2015 1003   NA 136 03/31/2013 1414   K 4.4 04/02/2015 1003   K 4.0 03/31/2013 1414   CL 101 04/02/2015 1003   CO2 28 04/02/2015 1003   CO2 24 03/31/2013 1414   GLUCOSE 167* 04/02/2015 1003   GLUCOSE 178* 03/31/2013 1414   BUN 11 04/02/2015 1003   BUN 8.1 03/31/2013 1414   CREATININE 0.70 04/02/2015 1003   CREATININE 0.7 03/31/2013 1414   CALCIUM 9.9 04/02/2015 1003   CALCIUM 10.1 03/31/2013 1414   PROT 7.0 04/02/2015 1003   PROT 7.2 03/31/2013 1414   ALBUMIN 4.1 04/02/2015 1003   ALBUMIN 3.9 03/31/2013 1414   AST 21 04/02/2015 1003   AST 19 03/31/2013 1414   ALT 23 04/02/2015 1003   ALT 15 03/31/2013 1414   ALKPHOS 47 04/02/2015 1003   ALKPHOS 55 03/31/2013 1414   BILITOT 0.3 04/02/2015 1003   BILITOT 0.28 03/31/2013 1414   GFRNONAA 112.68 10/29/2009 1221    No results found for: SPEP, UPEP  Lab Results  Component Value Date   WBC 6.6 07/23/2015    NEUTROABS 3.1 07/23/2015   HGB 11.0* 07/23/2015   HCT 32.7* 07/23/2015   MCV 100.6 07/23/2015   PLT 508* 07/23/2015      Chemistry      Component Value Date/Time   NA 135 04/02/2015 1003   NA 136 03/31/2013 1414   K 4.4 04/02/2015 1003   K 4.0 03/31/2013 1414   CL 101 04/02/2015 1003   CO2 28 04/02/2015 1003   CO2 24 03/31/2013 1414   BUN 11 04/02/2015 1003   BUN 8.1 03/31/2013 1414   CREATININE 0.70 04/02/2015 1003   CREATININE 0.7 03/31/2013 1414      Component Value Date/Time   CALCIUM 9.9 04/02/2015 1003   CALCIUM 10.1 03/31/2013 1414   ALKPHOS 47 04/02/2015 1003   ALKPHOS 55 03/31/2013 1414   AST 21 04/02/2015 1003   AST 19 03/31/2013 1414   ALT 23 04/02/2015 1003   ALT 15 03/31/2013 1414   BILITOT 0.3 04/02/2015 1003   BILITOT 0.28 03/31/2013 1414  ASSESSMENT & PLAN:  Essential thrombocythemia She is doing well. She was not able to get anagrelide approved/filled. Even though her platelet count is suboptimally controlled, the patient is not symptomatic and has never been diagnosed with blood clot. There is no concurrent leukocytosis. She has evidence of anemia and I am reluctant to increase the dose of hydroxyurea further. I recommend she continues taking hydroxyurea 1 tablet daily without change in dose. I will see her back in 6 months for further follow-up.  Anemia in neoplastic disease This is likely due to recent treatment. The patient denies recent history of bleeding such as epistaxis, hematuria or hematochezia. She is asymptomatic from the anemia. I will observe for now.      No orders of the defined types were placed in this encounter.   All questions were answered. The patient knows to call the clinic with any problems, questions or concerns. No barriers to learning was detected. I spent 15 minutes counseling the patient face to face. The total time spent in the appointment was 20 minutes and more than 50% was on counseling and review of  test results     [facility], Dalia Jollie, MD 07/23/2015 2:17 PM

## 2015-07-23 NOTE — Telephone Encounter (Signed)
Gave and printed appt sched and avs for pt for Sept °

## 2015-07-23 NOTE — Assessment & Plan Note (Signed)
This is likely due to recent treatment. The patient denies recent history of bleeding such as epistaxis, hematuria or hematochezia. She is asymptomatic from the anemia. I will observe for now.   

## 2015-08-06 ENCOUNTER — Other Ambulatory Visit (INDEPENDENT_AMBULATORY_CARE_PROVIDER_SITE_OTHER): Payer: Medicare Other

## 2015-08-06 DIAGNOSIS — E119 Type 2 diabetes mellitus without complications: Secondary | ICD-10-CM

## 2015-08-06 DIAGNOSIS — D473 Essential (hemorrhagic) thrombocythemia: Secondary | ICD-10-CM | POA: Diagnosis not present

## 2015-08-06 DIAGNOSIS — D630 Anemia in neoplastic disease: Secondary | ICD-10-CM

## 2015-08-06 LAB — HEMOGLOBIN A1C: HEMOGLOBIN A1C: 8 % — AB (ref 4.6–6.5)

## 2015-10-01 ENCOUNTER — Ambulatory Visit: Payer: Medicare Other | Admitting: Internal Medicine

## 2015-10-15 ENCOUNTER — Encounter: Payer: Self-pay | Admitting: Internal Medicine

## 2015-10-15 ENCOUNTER — Ambulatory Visit (INDEPENDENT_AMBULATORY_CARE_PROVIDER_SITE_OTHER): Payer: Medicare Other | Admitting: Internal Medicine

## 2015-10-15 VITALS — BP 130/80 | HR 80 | Temp 98.2°F | Wt 160.0 lb

## 2015-10-15 DIAGNOSIS — E1165 Type 2 diabetes mellitus with hyperglycemia: Secondary | ICD-10-CM | POA: Diagnosis not present

## 2015-10-15 DIAGNOSIS — IMO0001 Reserved for inherently not codable concepts without codable children: Secondary | ICD-10-CM

## 2015-10-15 LAB — HM DIABETES FOOT EXAM

## 2015-10-15 MED ORDER — SIMVASTATIN 10 MG PO TABS: 10.0000 mg | ORAL_TABLET | Freq: Every day | ORAL | Status: DC

## 2015-10-15 MED ORDER — SITAGLIPTIN PHOSPHATE 100 MG PO TABS: 100.0000 mg | ORAL_TABLET | Freq: Every day | ORAL | Status: DC

## 2015-10-15 NOTE — Progress Notes (Signed)
Subjective:    Patient ID: Savannah Evans, female    DOB: [birth year], 65 y.o.   MRN: [id number]  HPI Here for follow up of diabetes Trying to be more careful Savannah Evans lost a couple of pounds Work still stressful--but she likes it (but no option for reduced work schedule--looking into this)  Checks sugars once a week--- usually around 150 No apparent hypoglycemic spells No problems with feet  Current Outpatient Prescriptions on File Prior to Visit  Medication Sig Dispense Refill  . Ascorbic Acid (VITAMIN C) 500 MG tablet Take 500 mg by mouth daily.      Marland Kitchen aspirin 81 MG tablet Take 81 mg by mouth daily.      . calcium-vitamin D (OSCAL) 250-125 MG-UNIT per tablet Take 1 tablet by mouth daily.      . cetirizine (ZYRTEC) 10 MG tablet Take 10 mg by mouth daily as needed for allergies.     . fish oil-omega-3 fatty acids 1000 MG capsule Take 1 g by mouth daily.     Marland Kitchen glipiZIDE (GLUCOTROL) 5 MG tablet TAKE ONE TABLET BY MOUTH TWICE DAILY BEFORE MEALS 180 tablet 3  . hydroxyurea (HYDREA) 500 MG capsule Take 1 capsule (500 mg total) by mouth daily. 90 capsule 3  . JANUVIA 100 MG tablet TAKE ONE TABLET BY MOUTH ONCE DAILY 90 tablet 3  . metFORMIN (GLUCOPHAGE) 1000 MG tablet TAKE ONE TABLET BY MOUTH TWICE DAILY WITH MEALS 180 tablet 3  . Multiple Vitamin (MULTIVITAMIN) tablet Take 1 tablet by mouth daily.      Marland Kitchen omeprazole (PRILOSEC OTC) 20 MG tablet Take 20 mg by mouth daily.     . simvastatin (ZOCOR) 10 MG tablet TAKE ONE TABLET BY MOUTH ONCE DAILY 90 tablet 3   No current facility-administered medications on file prior to visit.    No Known Allergies  Past Medical History  Diagnosis Date  . Allergic rhinitis   . Diabetes mellitus type 2, noninsulin dependent ([place])   . GERD (gastroesophageal reflux disease)   . Hyperlipidemia   . SVT (supraventricular tachycardia) ([place])   . Osteoarthritis   . Diaphragmatic hernia   . Iron deficiency   . Essential thrombocythemia ([place]) 10/21/2012    JAK2  neg; BCR-ABL neg; bone marrow biopsy on 09/29/12 showed normal cytogenetics.   . Anemia, unspecified 03/03/2013  . History of palpitations     Past Surgical History  Procedure Laterality Date  . Svt ablation  2000    Dr. Caryl Comes  . Colonoscopy      neg age 38.  Next age 73.     Family History  Problem Relation Age of Onset  . Uterine cancer Sister   . Diabetes Sister   . Macular degeneration Mother   . Heart attack Father   . Cancer Paternal Aunt     cancer?  . Cancer Paternal Uncle     cancer?  . Colon cancer Neg Hx     Social History   Social History  . Marital Status: Married    Spouse Name: N/A  . Number of Children: 3  . Years of Education: N/A   Occupational History  . Manages loan administration area for [employer]    Social History Main Topics  . Smoking status: Never Smoker   . Smokeless tobacco: Never Used  . Alcohol Use: 0.6 oz/week    1 Glasses of wine per week     Comment: social  . Drug Use: No  . Sexual  Activity: Not on file   Other Topics Concern  . Not on file   Social History Narrative   Has living will   Requests husband as health care POA   Would accept resuscitation attempts   No tube feeds if cognitively unaware   Review of Systems  Some left wrist pain Pain in fingers is better Sleeps fairly well     Objective:   Physical Exam  Constitutional: She appears well-developed and well-nourished. No distress.  Neck: Normal range of motion. Neck supple. No thyromegaly present.  Cardiovascular: Normal rate, regular rhythm, normal heart sounds and intact distal pulses.  Exam reveals no gallop.   No murmur heard. Pulmonary/Chest: Effort normal and breath sounds normal. No respiratory distress. She has no wheezes. She has no rales.  Musculoskeletal: She exhibits no edema.  Lymphadenopathy:    She has no cervical adenopathy.  Neurological:  Normal sensation in feet  Skin:  Slight plantar callous No ulcers  Psychiatric: She has a  normal mood and affect. Her behavior is normal.          Assessment & Plan:

## 2015-10-15 NOTE — Progress Notes (Signed)
Pre visit review using our clinic review tool, if applicable. No additional management support is needed unless otherwise documented below in the visit note. 

## 2015-10-15 NOTE — Assessment & Plan Note (Signed)
Some better on last check Next step is lantus She is working on lifestyle Recheck next time

## 2015-12-04 ENCOUNTER — Other Ambulatory Visit: Payer: Self-pay | Admitting: Internal Medicine

## 2015-12-22 DIAGNOSIS — E119 Type 2 diabetes mellitus without complications: Secondary | ICD-10-CM | POA: Diagnosis not present

## 2015-12-22 LAB — HM DIABETES EYE EXAM

## 2016-01-28 ENCOUNTER — Other Ambulatory Visit (HOSPITAL_BASED_OUTPATIENT_CLINIC_OR_DEPARTMENT_OTHER): Payer: Medicare Other

## 2016-01-28 ENCOUNTER — Ambulatory Visit (HOSPITAL_BASED_OUTPATIENT_CLINIC_OR_DEPARTMENT_OTHER): Payer: Medicare Other | Admitting: Hematology and Oncology

## 2016-01-28 ENCOUNTER — Telehealth: Payer: Self-pay | Admitting: Hematology and Oncology

## 2016-01-28 ENCOUNTER — Encounter: Payer: Self-pay | Admitting: Hematology and Oncology

## 2016-01-28 VITALS — BP 159/65 | HR 110 | Temp 99.2°F | Resp 18 | Ht 64.0 in | Wt 162.9 lb

## 2016-01-28 DIAGNOSIS — I10 Essential (primary) hypertension: Secondary | ICD-10-CM | POA: Insufficient documentation

## 2016-01-28 DIAGNOSIS — D473 Essential (hemorrhagic) thrombocythemia: Secondary | ICD-10-CM

## 2016-01-28 DIAGNOSIS — D630 Anemia in neoplastic disease: Secondary | ICD-10-CM | POA: Diagnosis not present

## 2016-01-28 LAB — CBC WITH DIFFERENTIAL/PLATELET
BASO%: 0.4 % (ref 0.0–2.0)
Basophils Absolute: 0 10*3/uL (ref 0.0–0.1)
EOS%: 1.4 % (ref 0.0–7.0)
Eosinophils Absolute: 0.1 10*3/uL (ref 0.0–0.5)
HCT: 31.9 % — ABNORMAL LOW (ref 34.8–46.6)
HEMOGLOBIN: 10.7 g/dL — AB (ref 11.6–15.9)
LYMPH#: 1.9 10*3/uL (ref 0.9–3.3)
LYMPH%: 27.7 % (ref 14.0–49.7)
MCH: 33.6 pg (ref 25.1–34.0)
MCHC: 33.6 g/dL (ref 31.5–36.0)
MCV: 100 fL (ref 79.5–101.0)
MONO#: 0.8 10*3/uL (ref 0.1–0.9)
MONO%: 11.1 % (ref 0.0–14.0)
NEUT%: 59.4 % (ref 38.4–76.8)
NEUTROS ABS: 4.1 10*3/uL (ref 1.5–6.5)
Platelets: 469 10*3/uL — ABNORMAL HIGH (ref 145–400)
RBC: 3.19 10*6/uL — ABNORMAL LOW (ref 3.70–5.45)
RDW: 13 % (ref 11.2–14.5)
WBC: 6.9 10*3/uL (ref 3.9–10.3)

## 2016-01-28 MED ORDER — HYDROXYUREA 500 MG PO CAPS: 500.0000 mg | ORAL_CAPSULE | Freq: Every day | ORAL | 3 refills | Status: DC

## 2016-01-28 NOTE — Telephone Encounter (Signed)
Gave patient avs report and appointments for March 2018.  °

## 2016-01-28 NOTE — Assessment & Plan Note (Addendum)
This is likely due to recent treatment and poorly controlled DM. The patient denies recent history of bleeding such as epistaxis, hematuria or hematochezia. She is asymptomatic from the anemia. I will observe for now.  

## 2016-01-28 NOTE — Assessment & Plan Note (Signed)
She is doing well. She was not able to get anagrelide approved/filled. Even though her platelet count is suboptimally controlled, the patient is not symptomatic and has never been diagnosed with blood clot. There is no concurrent leukocytosis. She has evidence of anemia and I am reluctant to increase the dose of hydroxyurea further. I recommend she continues taking hydroxyurea 1 tablet daily without change in dose. I will see her back in 6 months for further follow-up. 

## 2016-01-28 NOTE — Progress Notes (Signed)
[facility] OFFICE PROGRESS NOTE  Patient Care Team: [name], MD as PCP - General W Delene Loll, MD as Attending Physician (Obstetrics and Gynecology) Heath Lark, MD as Consulting Physician (Hematology and Oncology)  SUMMARY OF ONCOLOGIC HISTORY:  This patient used to be a blood donor. She was refused blood donation for several years due to anemia. A review of her CBC dated back to 2012 in note of mild anemia with associated thrombocytosis. In May of this year, she underwent extensive evaluation. Iron studies show that she had borderline iron deficiency. In June 2014, she had a bone marrow aspirate and biopsy. Results are inconclusive but there are presence of atypical megakaryocytes. Cytogenetics a study was normal. The patient was started on hydroxyurea. MPL mutation, BCR/ABL and JAK2 mutation testing was negative In December 2014, she had EGD and colonoscopy for evaluation of iron deficiency anemia and the tests were negative apart from mild nonspecific gastritis. She was placed on iron supplements, subsequently discontinued in January 2015 On 01/08/2014, dose of hydroxyurea was modified to. She is instructed to take 6 days a week and skip on Sundays. Since September 2015, she reduce hydroxyurea by skipping doses on Wednesday and Sundays On 05/04/2014, I reduce hydroxyurea dose further to 500 mg 4 days a week and to add anagrelide. Due to difficulties obtaining the pills, she had not been able to take hydroxyurea and anagrelide. On 06/04/14: dose of hydroxyurea is increased to 500 mg daily.  INTERVAL HISTORY: Please see below for problem oriented charting. She feels well. Denies recent infection. She denies signs and symptoms of anemia. The patient denies any recent signs or symptoms of bleeding such as spontaneous epistaxis, hematuria or hematochezia.  REVIEW OF SYSTEMS:   Constitutional: Denies fevers, chills or abnormal weight loss Eyes: Denies blurriness of  vision Ears, nose, mouth, throat, and face: Denies mucositis or sore throat Respiratory: Denies cough, dyspnea or wheezes Cardiovascular: Denies palpitation, chest discomfort or lower extremity swelling Gastrointestinal:  Denies nausea, heartburn or change in bowel habits Skin: Denies abnormal skin rashes Lymphatics: Denies new lymphadenopathy or easy bruising Neurological:Denies numbness, tingling or new weaknesses Behavioral/Psych: Mood is stable, no new changes  All other systems were reviewed with the patient and are negative.  I have reviewed the past medical history, past surgical history, social history and family history with the patient and they are unchanged from previous note.  ALLERGIES:  has No Known Allergies.  MEDICATIONS:  Current Outpatient Prescriptions  Medication Sig Dispense Refill  . Ascorbic Acid (VITAMIN C) 500 MG tablet Take 500 mg by mouth daily.      Marland Kitchen aspirin 81 MG tablet Take 81 mg by mouth daily.      . calcium-vitamin D (OSCAL) 250-125 MG-UNIT per tablet Take 1 tablet by mouth daily.      . cetirizine (ZYRTEC) 10 MG tablet Take 10 mg by mouth daily as needed for allergies.     . fish oil-omega-3 fatty acids 1000 MG capsule Take 1 g by mouth daily.     Marland Kitchen glipiZIDE (GLUCOTROL) 5 MG tablet TAKE ONE TABLET BY MOUTH TWICE DAILY BEFORE MEALS 180 tablet 3  . hydroxyurea (HYDREA) 500 MG capsule Take 1 capsule (500 mg total) by mouth daily. 90 capsule 3  . metFORMIN (GLUCOPHAGE) 1000 MG tablet TAKE ONE TABLET BY MOUTH TWICE DAILY WITH MEALS 180 tablet 3  . Multiple Vitamin (MULTIVITAMIN) tablet Take 1 tablet by mouth daily.      Marland Kitchen omeprazole (PRILOSEC OTC)  20 MG tablet Take 20 mg by mouth daily.     . simvastatin (ZOCOR) 10 MG tablet Take 1 tablet (10 mg total) by mouth daily. 90 tablet 3  . sitaGLIPtin (JANUVIA) 100 MG tablet Take 1 tablet (100 mg total) by mouth daily. 90 tablet 3   No current facility-administered medications for this visit.     PHYSICAL  EXAMINATION: ECOG PERFORMANCE STATUS: 0 - Asymptomatic  Vitals:   01/28/16 1351  BP: (!) 159/65  Pulse: (!) 110  Resp: 18  Temp: 99.2 F (37.3 C)   Filed Weights   01/28/16 1351  Weight: 162 lb 14.4 oz (73.9 kg)    GENERAL:alert, no distress and comfortable SKIN: skin color, texture, turgor are normal, no rashes or significant lesions EYES: normal, Conjunctiva are pink and non-injected, sclera clear OROPHARYNX:no exudate, no erythema and lips, buccal mucosa, and tongue normal  NECK: supple, thyroid normal size, non-tender, without nodularity LYMPH:  no palpable lymphadenopathy in the cervical, axillary or inguinal LUNGS: clear to auscultation and percussion with normal breathing effort HEART: regular rate & rhythm and no murmurs and no lower extremity edema ABDOMEN:abdomen soft, non-tender and normal bowel sounds Musculoskeletal:no cyanosis of digits and no clubbing  NEURO: alert & oriented x 3 with fluent speech, no focal motor/sensory deficits  LABORATORY DATA:  I have reviewed the data as listed    Component Value Date/Time   NA 135 04/02/2015 1003   NA 136 03/31/2013 1414   K 4.4 04/02/2015 1003   K 4.0 03/31/2013 1414   CL 101 04/02/2015 1003   CO2 28 04/02/2015 1003   CO2 24 03/31/2013 1414   GLUCOSE 167 (H) 04/02/2015 1003   GLUCOSE 178 (H) 03/31/2013 1414   BUN 11 04/02/2015 1003   BUN 8.1 03/31/2013 1414   CREATININE 0.70 04/02/2015 1003   CREATININE 0.7 03/31/2013 1414   CALCIUM 9.9 04/02/2015 1003   CALCIUM 10.1 03/31/2013 1414   PROT 7.0 04/02/2015 1003   PROT 7.2 03/31/2013 1414   ALBUMIN 4.1 04/02/2015 1003   ALBUMIN 3.9 03/31/2013 1414   AST 21 04/02/2015 1003   AST 19 03/31/2013 1414   ALT 23 04/02/2015 1003   ALT 15 03/31/2013 1414   ALKPHOS 47 04/02/2015 1003   ALKPHOS 55 03/31/2013 1414   BILITOT 0.3 04/02/2015 1003   BILITOT 0.28 03/31/2013 1414   GFRNONAA 112.68 10/29/2009 1221    No results found for: SPEP, UPEP  Lab Results   Component Value Date   WBC 6.9 01/28/2016   NEUTROABS 4.1 01/28/2016   HGB 10.7 (L) 01/28/2016   HCT 31.9 (L) 01/28/2016   MCV 100.0 01/28/2016   PLT 469 (H) 01/28/2016      Chemistry      Component Value Date/Time   NA 135 04/02/2015 1003   NA 136 03/31/2013 1414   K 4.4 04/02/2015 1003   K 4.0 03/31/2013 1414   CL 101 04/02/2015 1003   CO2 28 04/02/2015 1003   CO2 24 03/31/2013 1414   BUN 11 04/02/2015 1003   BUN 8.1 03/31/2013 1414   CREATININE 0.70 04/02/2015 1003   CREATININE 0.7 03/31/2013 1414      Component Value Date/Time   CALCIUM 9.9 04/02/2015 1003   CALCIUM 10.1 03/31/2013 1414   ALKPHOS 47 04/02/2015 1003   ALKPHOS 55 03/31/2013 1414   AST 21 04/02/2015 1003   AST 19 03/31/2013 1414   ALT 23 04/02/2015 1003   ALT 15 03/31/2013 1414   BILITOT 0.3 04/02/2015  1003   BILITOT 0.28 03/31/2013 1414       ASSESSMENT & PLAN:  Essential thrombocythemia She is doing well. She was not able to get anagrelide approved/filled. Even though her platelet count is suboptimally controlled, the patient is not symptomatic and has never been diagnosed with blood clot. There is no concurrent leukocytosis. She has evidence of anemia and I am reluctant to increase the dose of hydroxyurea further. I recommend she continues taking hydroxyurea 1 tablet daily without change in dose. I will see her back in 6 months for further follow-up.  Anemia in neoplastic disease This is likely due to recent treatment and poorly controlled DM. The patient denies recent history of bleeding such as epistaxis, hematuria or hematochezia. She is asymptomatic from the anemia. I will observe for now.   Essential hypertension she will continue current medical management. I recommend close follow-up with primary care doctor for medication adjustment.    No orders of the defined types were placed in this encounter.  All questions were answered. The patient knows to call the clinic with any  problems, questions or concerns. No barriers to learning was detected. I spent 15 minutes counseling the patient face to face. The total time spent in the appointment was 20 minutes and more than 50% was on counseling and review of test results     [facility], [place], MD 01/28/2016 3:08 PM

## 2016-01-28 NOTE — Assessment & Plan Note (Signed)
she will continue current medical management. I recommend close follow-up with primary care doctor for medication adjustment.  

## 2016-02-14 ENCOUNTER — Ambulatory Visit (INDEPENDENT_AMBULATORY_CARE_PROVIDER_SITE_OTHER): Payer: Medicare Other | Admitting: Internal Medicine

## 2016-02-14 ENCOUNTER — Encounter: Payer: Self-pay | Admitting: Internal Medicine

## 2016-02-14 VITALS — BP 142/82 | HR 82 | Temp 98.7°F | Wt 162.0 lb

## 2016-02-14 DIAGNOSIS — J209 Acute bronchitis, unspecified: Secondary | ICD-10-CM

## 2016-02-14 MED ORDER — PREDNISONE 10 MG PO TABS: ORAL_TABLET | ORAL | 0 refills | Status: DC

## 2016-02-14 MED ORDER — AZITHROMYCIN 250 MG PO TABS: ORAL_TABLET | ORAL | 0 refills | Status: DC

## 2016-02-14 MED ORDER — ALBUTEROL SULFATE HFA 108 (90 BASE) MCG/ACT IN AERS: 2.0000 | INHALATION_SPRAY | Freq: Four times a day (QID) | RESPIRATORY_TRACT | 0 refills | Status: DC | PRN

## 2016-02-14 NOTE — Progress Notes (Signed)
HPI  Pt presents to the clinic today with c/o cough and mild shortness of breath. She reports this started 3 weeks ago. The cough is productive with white mucous at times. She is mostly short of breath with exertion but denies chest pain. She denies runny nose, nasal congestion, ear pain, or sore throat. She has tried an antihistamine and cold medicine OTC with minimal relief. She has a history of seasonal allergies but denies breathing problems. She has not had sick contacts.  Review of Systems      Past Medical History:  Diagnosis Date  . Allergic rhinitis   . Anemia, unspecified 03/03/2013  . Diabetes mellitus type 2, noninsulin dependent ([place])   . Diaphragmatic hernia   . Essential thrombocythemia ([place]) 10/21/2012   JAK2 neg; BCR-ABL neg; bone marrow biopsy on 09/29/12 showed normal cytogenetics.   Marland Kitchen GERD (gastroesophageal reflux disease)   . History of palpitations   . Hyperlipidemia   . Iron deficiency   . Osteoarthritis   . SVT (supraventricular tachycardia) ([facility])     Family History  Problem Relation Age of Onset  . Heart attack Father   . Uterine cancer Sister   . Diabetes Sister   . Macular degeneration Mother   . Cancer Paternal Aunt     cancer?  . Cancer Paternal Uncle     cancer?  . Colon cancer Neg Hx     Social History   Social History  . Marital status: Married    Spouse name: N/A  . Number of children: 3  . Years of education: N/A   Occupational History  . Manages loan administration area for [employer]    Social History Main Topics  . Smoking status: Never Smoker  . Smokeless tobacco: Never Used  . Alcohol use 0.6 oz/week    1 Glasses of wine per week     Comment: social  . Drug use: No  . Sexual activity: Not on file   Other Topics Concern  . Not on file   Social History Narrative   Has living will   Requests husband as health care POA   Would accept resuscitation attempts   No tube feeds if cognitively unaware    No Known  Allergies   Constitutional:  Denies headache, fatigue, fever or  abrupt weight changes.  HEENT:  Denies eye redness, eye pain, pressure behind the eyes, facial pain, nasal congestion, ear pain, ringing in the ears, wax buildup, runny nose or sore throat. Respiratory: Positive cough and shortness of breath. Denies difficulty breathing.  Cardiovascular: Denies chest pain, chest tightness, palpitations or swelling in the hands or feet.   No other specific complaints in a complete review of systems (except as listed in HPI above).  Objective:   BP (!) 142/82   Pulse 82   Temp 98.7 F (37.1 C) (Oral)   Wt 162 lb (73.5 kg)   SpO2 96%   BMI 27.81 kg/m  Wt Readings from Last 3 Encounters:  02/14/16 162 lb (73.5 kg)  01/28/16 162 lb 14.4 oz (73.9 kg)  10/15/15 160 lb (72.6 kg)     General: Appears her stated age, in NAD. HEENT: Head: normal shape and size, no sinus tenderness noted; Eyes: sclera white, no icterus, conjunctiva pink; Ears: Tm's gray and intact, normal light reflex; Nose: mucosa pink and moist, septum midline; Throat/Mouth:  Teeth present, mucosa pink and moist, no exudate noted, no lesions or ulcerations noted.  Neck: No cervical lymphadenopathy.  Cardiovascular: Normal  rate and rhythm. S1,S2 noted.  No murmur, rubs or gallops noted.  Pulmonary/Chest: Normal effort and ispiratory and expiratory wheezing noted of the left lung. Scattered rhonchi noted of the left lung. No respiratory distress.     Assessment & Plan:   Acute Bronchitis:  Get some rest and drink plenty of water eRx for Azithromax x 5 days eRx for Pred Taper x 6 days- cautioned about blood sugar elevation eRx for Albuterol inhaler  RTC as needed or if symptoms persist.   Webb Silversmith, NP

## 2016-02-14 NOTE — Patient Instructions (Signed)

## 2016-04-14 ENCOUNTER — Ambulatory Visit (INDEPENDENT_AMBULATORY_CARE_PROVIDER_SITE_OTHER): Payer: Medicare Other | Admitting: Internal Medicine

## 2016-04-14 ENCOUNTER — Encounter: Payer: Self-pay | Admitting: Internal Medicine

## 2016-04-14 ENCOUNTER — Telehealth: Payer: Self-pay | Admitting: Hematology and Oncology

## 2016-04-14 VITALS — BP 134/72 | HR 71 | Temp 98.0°F | Ht 64.0 in | Wt 160.0 lb

## 2016-04-14 DIAGNOSIS — I471 Supraventricular tachycardia, unspecified: Secondary | ICD-10-CM

## 2016-04-14 DIAGNOSIS — I10 Essential (primary) hypertension: Secondary | ICD-10-CM

## 2016-04-14 DIAGNOSIS — Z23 Encounter for immunization: Secondary | ICD-10-CM

## 2016-04-14 DIAGNOSIS — D473 Essential (hemorrhagic) thrombocythemia: Secondary | ICD-10-CM

## 2016-04-14 DIAGNOSIS — IMO0001 Reserved for inherently not codable concepts without codable children: Secondary | ICD-10-CM

## 2016-04-14 DIAGNOSIS — Z0000 Encounter for general adult medical examination without abnormal findings: Secondary | ICD-10-CM | POA: Diagnosis not present

## 2016-04-14 DIAGNOSIS — E1165 Type 2 diabetes mellitus with hyperglycemia: Secondary | ICD-10-CM

## 2016-04-14 DIAGNOSIS — Z7189 Other specified counseling: Secondary | ICD-10-CM

## 2016-04-14 LAB — CBC WITH DIFFERENTIAL/PLATELET
Basophils Absolute: 0 10*3/uL (ref 0.0–0.1)
Basophils Relative: 0.6 % (ref 0.0–3.0)
EOS ABS: 0.2 10*3/uL (ref 0.0–0.7)
Eosinophils Relative: 2.9 % (ref 0.0–5.0)
HCT: 32.6 % — ABNORMAL LOW (ref 36.0–46.0)
HEMOGLOBIN: 10.9 g/dL — AB (ref 12.0–15.0)
Lymphocytes Relative: 37.9 % (ref 12.0–46.0)
Lymphs Abs: 2.2 10*3/uL (ref 0.7–4.0)
MCHC: 33.3 g/dL (ref 30.0–36.0)
MCV: 101.3 fl — ABNORMAL HIGH (ref 78.0–100.0)
MONO ABS: 0.6 10*3/uL (ref 0.1–1.0)
Monocytes Relative: 9.6 % (ref 3.0–12.0)
NEUTROS PCT: 49 % (ref 43.0–77.0)
Neutro Abs: 2.8 10*3/uL (ref 1.4–7.7)
Platelets: 551 10*3/uL — ABNORMAL HIGH (ref 150.0–400.0)
RBC: 3.22 Mil/uL — AB (ref 3.87–5.11)
RDW: 13.5 % (ref 11.5–15.5)
WBC: 5.8 10*3/uL (ref 4.0–10.5)

## 2016-04-14 LAB — HEMOGLOBIN A1C: Hgb A1c MFr Bld: 8.2 % — ABNORMAL HIGH (ref 4.6–6.5)

## 2016-04-14 LAB — COMPREHENSIVE METABOLIC PANEL
ALBUMIN: 4.3 g/dL (ref 3.5–5.2)
ALK PHOS: 41 U/L (ref 39–117)
ALT: 17 U/L (ref 0–35)
AST: 18 U/L (ref 0–37)
BILIRUBIN TOTAL: 0.4 mg/dL (ref 0.2–1.2)
BUN: 11 mg/dL (ref 6–23)
CO2: 28 mEq/L (ref 19–32)
CREATININE: 0.72 mg/dL (ref 0.40–1.20)
Calcium: 10 mg/dL (ref 8.4–10.5)
Chloride: 103 mEq/L (ref 96–112)
GFR: 85.99 mL/min (ref >60.00)
Glucose, Bld: 88 mg/dL (ref 70–99)
Potassium: 4.5 mEq/L (ref 3.5–5.1)
SODIUM: 139 meq/L (ref 135–145)
TOTAL PROTEIN: 7.4 g/dL (ref 6.0–8.3)

## 2016-04-14 LAB — LIPID PANEL
CHOLESTEROL: 132 mg/dL (ref 0–200)
HDL: 56.8 mg/dL (ref >39.00)
LDL CALC: 64 mg/dL (ref 0–99)
NonHDL: 75.68
TRIGLYCERIDES: 58 mg/dL (ref 0.0–149.0)
Total CHOL/HDL Ratio: 2
VLDL: 11.6 mg/dL (ref 0.0–40.0)

## 2016-04-14 LAB — MICROALBUMIN / CREATININE URINE RATIO
Creatinine,U: 85.1 mg/dL
MICROALB UR: 1.4 mg/dL (ref 0.0–1.9)
MICROALB/CREAT RATIO: 1.6 mg/g (ref 0.0–30.0)

## 2016-04-14 LAB — HM DIABETES FOOT EXAM

## 2016-04-14 NOTE — Patient Instructions (Signed)
Please set up your screening mammogram. 

## 2016-04-14 NOTE — Assessment & Plan Note (Signed)
Continues on the hydroxyurea

## 2016-04-14 NOTE — Progress Notes (Signed)
Pre visit review using our clinic review tool, if applicable. No additional management support is needed unless otherwise documented below in the visit note. 

## 2016-04-14 NOTE — Assessment & Plan Note (Signed)
I have personally reviewed the Medicare Annual Wellness questionnaire and have noted 1. The patient's medical and social history 2. Their use of alcohol, tobacco or illicit drugs 3. Their current medications and supplements 4. The patient's functional ability including ADL's, fall risks, home safety risks and hearing or visual             impairment. 5. Diet and physical activities 6. Evidence for depression or mood disorders  The patients weight, height, BMI and visual acuity have been recorded in the chart I have made referrals, counseling and provided education to the patient based review of the above and I have provided the pt with a written personalized care plan for preventive services.  I have provided you with a copy of your personalized plan for preventive services. Please take the time to review along with your updated medication list.  Discussed fitness Colon due 2024 Due for mammogram now Discussed shingrix--will wait Flu vaccine today

## 2016-04-14 NOTE — Addendum Note (Signed)
Addended by: Pilar Grammes on: 04/14/2016 08:16 AM   Modules accepted: Orders

## 2016-04-14 NOTE — Assessment & Plan Note (Signed)
No evidence of recurrence 

## 2016-04-14 NOTE — Assessment & Plan Note (Signed)
Forms given for POA

## 2016-04-14 NOTE — Progress Notes (Signed)
Subjective:    Patient ID: Savannah Evans, female    DOB: Aug 18, 1950, 65 y.o.   MRN: [id number]  HPI Here for Medicare wellness and follow up of chronic health conditions Reviewed form and advanced directives Reviewed other doctors No alcohol or tobacco No exercise but physically active regularly Vision is fine Hearing is fine No falls No depression or anhedonia Independent with instrumental ADLs No memory issues  Doing well Energy levels are okay Busy remodeling in laws house--down the road Continuing the full time job Thrombocytosis is controlled but still with anemia Dr Alvy Bimler adjusting meds  Checking sugars once a week Over 150 fasting-- but generally under 200 No hypoglycemic reactions No sores, numbness or pain in her feet Had eye exam in July--she will get report  No palpitations No chest pain or SOB No dizziness or syncope No edema  Current Outpatient Prescriptions on File Prior to Visit  Medication Sig Dispense Refill  . Ascorbic Acid (VITAMIN C) 500 MG tablet Take 500 mg by mouth daily.      Marland Kitchen aspirin 81 MG tablet Take 81 mg by mouth daily.      . calcium-vitamin D (OSCAL) 250-125 MG-UNIT per tablet Take 1 tablet by mouth daily.      . cetirizine (ZYRTEC) 10 MG tablet Take 10 mg by mouth daily as needed for allergies.     . fish oil-omega-3 fatty acids 1000 MG capsule Take 1 g by mouth daily.     Marland Kitchen glipiZIDE (GLUCOTROL) 5 MG tablet TAKE ONE TABLET BY MOUTH TWICE DAILY BEFORE MEALS 180 tablet 3  . hydroxyurea (HYDREA) 500 MG capsule Take 1 capsule (500 mg total) by mouth daily. 90 capsule 3  . metFORMIN (GLUCOPHAGE) 1000 MG tablet TAKE ONE TABLET BY MOUTH TWICE DAILY WITH MEALS 180 tablet 3  . Multiple Vitamin (MULTIVITAMIN) tablet Take 1 tablet by mouth daily.      Marland Kitchen omeprazole (PRILOSEC OTC) 20 MG tablet Take 20 mg by mouth daily.     . simvastatin (ZOCOR) 10 MG tablet Take 1 tablet (10 mg total) by mouth daily. 90 tablet 3  . sitaGLIPtin (JANUVIA) 100  MG tablet Take 1 tablet (100 mg total) by mouth daily. 90 tablet 3   No current facility-administered medications on file prior to visit.     No Known Allergies  Past Medical History:  Diagnosis Date  . Allergic rhinitis   . Anemia, unspecified 03/03/2013  . Diabetes mellitus type 2, noninsulin dependent ([place])   . Diaphragmatic hernia   . Essential thrombocythemia ([place]) 10/21/2012   JAK2 neg; BCR-ABL neg; bone marrow biopsy on 09/29/12 showed normal cytogenetics.   Marland Kitchen GERD (gastroesophageal reflux disease)   . History of palpitations   . Hyperlipidemia   . Iron deficiency   . Osteoarthritis   . SVT (supraventricular tachycardia) ([facility])     Past Surgical History:  Procedure Laterality Date  . COLONOSCOPY     neg age 63.  Next age 54.   . svt ablation  2000   Dr. Caryl Comes    Family History  Problem Relation Age of Onset  . Heart attack Father   . Uterine cancer Sister   . Diabetes Sister   . Macular degeneration Mother   . Cancer Paternal Aunt     cancer?  . Cancer Paternal Uncle     cancer?  . Colon cancer Neg Hx     Social History   Social History  . Marital status: Married  Spouse name: N/A  . Number of children: 3  . Years of education: N/A   Occupational History  . Manages loan administration area for [employer]    Social History Main Topics  . Smoking status: Never Smoker  . Smokeless tobacco: Never Used  . Alcohol use 0.6 oz/week    1 Glasses of wine per week     Comment: social  . Drug use: No  . Sexual activity: Not on file   Other Topics Concern  . Not on file   Social History Narrative   Has living will   Requests husband as health care POA   Would accept resuscitation attempts   No tube feeds if cognitively unaware     Review of Systems Appetite is okay Weight is stable Sleeps well Wears seat belt Does have crown scheduled--- dentist-- Dr Tye Savoy No sig back or joint pain No rash or suspicious lesions Bowels are good. No  blood in stool Voids okay    Objective:   Physical Exam  Constitutional: She is oriented to person, place, and time. She appears well-developed and well-nourished. No distress.  HENT:  Mouth/Throat: Oropharynx is clear and moist. No oropharyngeal exudate.  Neck: Normal range of motion. Neck supple. No thyromegaly present.  Cardiovascular: Normal rate, regular rhythm, normal heart sounds and intact distal pulses.  Exam reveals no gallop.   No murmur heard. Pulmonary/Chest: Effort normal and breath sounds normal. No respiratory distress. She has no wheezes. She has no rales.  Abdominal: Soft. There is no tenderness.  Musculoskeletal: She exhibits no edema or tenderness.  Lymphadenopathy:    She has no cervical adenopathy.  Neurological: She is alert and oriented to person, place, and time.  President-- "Dwaine Deter, Bush" [phone] [id number] Recall 3/3  Normal sensation in feet  Skin: No rash noted.  Slight callous on right great toe--no other foot lesions  Psychiatric: She has a normal mood and affect. Her behavior is normal.          Assessment & Plan:

## 2016-04-14 NOTE — Assessment & Plan Note (Signed)
If well over 8%, will add lantus

## 2016-04-14 NOTE — Telephone Encounter (Signed)
I received notification from her primary care doctor. Her CBC show her platelet count is over 500,000. The patient remains asymptomatic and denies missing any prescription recently. I recommend increasing the dose of hydroxyurea on the weekend, either on Saturday or Sunday from 500 mg to 1000 mg and recheck as scheduled in March. She agreed with the plan of care.

## 2016-04-14 NOTE — Assessment & Plan Note (Signed)
BP Readings from Last 3 Encounters:  04/14/16 134/72  02/14/16 (!) 142/82  01/28/16 (!) 159/65   Better now

## 2016-04-20 ENCOUNTER — Other Ambulatory Visit: Payer: Self-pay | Admitting: Internal Medicine

## 2016-04-20 DIAGNOSIS — Z1231 Encounter for screening mammogram for malignant neoplasm of breast: Secondary | ICD-10-CM

## 2016-05-01 ENCOUNTER — Ambulatory Visit
Admission: RE | Admit: 2016-05-01 | Discharge: 2016-05-01 | Disposition: A | Discharge status: Home / Self Care | Payer: Medicare Other | Source: Ambulatory Visit | Attending: Internal Medicine | Admitting: Internal Medicine

## 2016-05-01 DIAGNOSIS — Z1231 Encounter for screening mammogram for malignant neoplasm of breast: Secondary | ICD-10-CM | POA: Diagnosis not present

## 2016-05-02 ENCOUNTER — Encounter: Payer: Self-pay | Admitting: Internal Medicine

## 2016-07-27 ENCOUNTER — Other Ambulatory Visit: Payer: Self-pay | Admitting: Hematology and Oncology

## 2016-07-27 DIAGNOSIS — D473 Essential (hemorrhagic) thrombocythemia: Secondary | ICD-10-CM

## 2016-07-28 ENCOUNTER — Telehealth: Payer: Self-pay | Admitting: Hematology and Oncology

## 2016-07-28 ENCOUNTER — Ambulatory Visit (HOSPITAL_BASED_OUTPATIENT_CLINIC_OR_DEPARTMENT_OTHER): Payer: Medicare Other | Admitting: Hematology and Oncology

## 2016-07-28 ENCOUNTER — Other Ambulatory Visit (HOSPITAL_BASED_OUTPATIENT_CLINIC_OR_DEPARTMENT_OTHER): Payer: Medicare Other

## 2016-07-28 ENCOUNTER — Encounter: Payer: Self-pay | Admitting: Hematology and Oncology

## 2016-07-28 DIAGNOSIS — D473 Essential (hemorrhagic) thrombocythemia: Secondary | ICD-10-CM

## 2016-07-28 DIAGNOSIS — IMO0001 Reserved for inherently not codable concepts without codable children: Secondary | ICD-10-CM

## 2016-07-28 DIAGNOSIS — D630 Anemia in neoplastic disease: Secondary | ICD-10-CM | POA: Diagnosis not present

## 2016-07-28 DIAGNOSIS — E1165 Type 2 diabetes mellitus with hyperglycemia: Secondary | ICD-10-CM

## 2016-07-28 DIAGNOSIS — E119 Type 2 diabetes mellitus without complications: Secondary | ICD-10-CM | POA: Diagnosis not present

## 2016-07-28 DIAGNOSIS — I10 Essential (primary) hypertension: Secondary | ICD-10-CM

## 2016-07-28 LAB — CBC WITH DIFFERENTIAL/PLATELET
BASO%: 0.4 % (ref 0.0–2.0)
Basophils Absolute: 0 10*3/uL (ref 0.0–0.1)
EOS%: 2.6 % (ref 0.0–7.0)
Eosinophils Absolute: 0.2 10*3/uL (ref 0.0–0.5)
HCT: 32.1 % — ABNORMAL LOW (ref 34.8–46.6)
HGB: 11 g/dL — ABNORMAL LOW (ref 11.6–15.9)
LYMPH%: 40.2 % (ref 14.0–49.7)
MCH: 33.2 pg (ref 25.1–34.0)
MCHC: 34.3 g/dL (ref 31.5–36.0)
MCV: 97 fL (ref 79.5–101.0)
MONO#: 0.5 10*3/uL (ref 0.1–0.9)
MONO%: 7 % (ref 0.0–14.0)
NEUT%: 49.8 % (ref 38.4–76.8)
NEUTROS ABS: 3.5 10*3/uL (ref 1.5–6.5)
PLATELETS: 518 10*3/uL — AB (ref 145–400)
RBC: 3.31 10*6/uL — AB (ref 3.70–5.45)
RDW: 12.8 % (ref 11.2–14.5)
WBC: 7 10*3/uL (ref 3.9–10.3)
lymph#: 2.8 10*3/uL (ref 0.9–3.3)
nRBC: 0 % (ref 0–0)

## 2016-07-28 NOTE — Assessment & Plan Note (Addendum)
she will continue current medical management. There may be an element of whitecoat hypertension here. I recommend close follow-up with primary care doctor for medication adjustment.

## 2016-07-28 NOTE — Assessment & Plan Note (Signed)
This is likely due to recent treatment and poorly controlled DM. The patient denies recent history of bleeding such as epistaxis, hematuria or hematochezia. She is asymptomatic from the anemia. I will observe for now.  

## 2016-07-28 NOTE — Progress Notes (Signed)
[facility] OFFICE PROGRESS NOTE  Patient Care Team: [name], MD as PCP - General W Delene Loll, MD as Attending Physician (Obstetrics and Gynecology) Heath Lark, MD as Consulting Physician (Hematology and Oncology)  SUMMARY OF ONCOLOGIC HISTORY:  This patient used to be a blood donor. She was refused blood donation for several years due to anemia. A review of her CBC dated back to 2012 in note of mild anemia with associated thrombocytosis. In May of this year, she underwent extensive evaluation. Iron studies show that she had borderline iron deficiency. In June 2014, she had a bone marrow aspirate and biopsy. Results are inconclusive but there are presence of atypical megakaryocytes. Cytogenetics a study was normal. The patient was started on hydroxyurea. MPL mutation, BCR/ABL and JAK2 mutation testing was negative In December 2014, she had EGD and colonoscopy for evaluation of iron deficiency anemia and the tests were negative apart from mild nonspecific gastritis. She was placed on iron supplements, subsequently discontinued in January 2015 On 01/08/2014, dose of hydroxyurea was modified to. She is instructed to take 6 days a week and skip on Sundays. Since September 2015, she reduce hydroxyurea by skipping doses on Wednesday and Sundays On 05/04/2014, I reduce hydroxyurea dose further to 500 mg 4 days a week and to add anagrelide. Due to difficulties obtaining the pills, she had not been able to take hydroxyurea and anagrelide. On 06/04/14: dose of hydroxyurea is increased to 500 mg daily.  INTERVAL HISTORY: Please see below for problem oriented charting. She returns for follow-up. She feels well. She is compliant taking all her medications as directed. Recent hemoglobin A1c was high.  The patient admits she was not able to get her diabetes under good control due to a lot of stress.  Her mother recently had a hip fracture and subsequently moved to an assisted living  place. She is currently trying to get her mother's house and her in-laws house renovated for them to be rented out. She denies recent infection.  No recent diagnosis of blood clots such as stroke, chest pain or DVT. She is taking aspirin as directed. The patient denies any recent signs or symptoms of bleeding such as spontaneous epistaxis, hematuria or hematochezia.  REVIEW OF SYSTEMS:   Constitutional: Denies fevers, chills or abnormal weight loss Eyes: Denies blurriness of vision Ears, nose, mouth, throat, and face: Denies mucositis or sore throat Respiratory: Denies cough, dyspnea or wheezes Cardiovascular: Denies palpitation, chest discomfort or lower extremity swelling Gastrointestinal:  Denies nausea, heartburn or change in bowel habits Skin: Denies abnormal skin rashes Lymphatics: Denies new lymphadenopathy or easy bruising Neurological:Denies numbness, tingling or new weaknesses Behavioral/Psych: Mood is stable, no new changes  All other systems were reviewed with the patient and are negative.  I have reviewed the past medical history, past surgical history, social history and family history with the patient and they are unchanged from previous note.  ALLERGIES:  has No Known Allergies.  MEDICATIONS:  Current Outpatient Prescriptions  Medication Sig Dispense Refill  . Ascorbic Acid (VITAMIN C) 500 MG tablet Take 500 mg by mouth daily.      Marland Kitchen aspirin 81 MG tablet Take 81 mg by mouth daily.      . calcium-vitamin D (OSCAL) 250-125 MG-UNIT per tablet Take 1 tablet by mouth daily.      . cetirizine (ZYRTEC) 10 MG tablet Take 10 mg by mouth daily as needed for allergies.     . fish oil-omega-3 fatty acids 1000  MG capsule Take 1 g by mouth daily.     Marland Kitchen glipiZIDE (GLUCOTROL) 5 MG tablet TAKE ONE TABLET BY MOUTH TWICE DAILY BEFORE MEALS 180 tablet 3  . hydroxyurea (HYDREA) 500 MG capsule Take 1 capsule (500 mg total) by mouth daily. 90 capsule 3  . metFORMIN (GLUCOPHAGE) 1000 MG  tablet TAKE ONE TABLET BY MOUTH TWICE DAILY WITH MEALS 180 tablet 3  . Multiple Vitamin (MULTIVITAMIN) tablet Take 1 tablet by mouth daily.      Marland Kitchen omeprazole (PRILOSEC OTC) 20 MG tablet Take 20 mg by mouth daily.     . simvastatin (ZOCOR) 10 MG tablet Take 1 tablet (10 mg total) by mouth daily. 90 tablet 3  . sitaGLIPtin (JANUVIA) 100 MG tablet Take 1 tablet (100 mg total) by mouth daily. 90 tablet 3   No current facility-administered medications for this visit.     PHYSICAL EXAMINATION: ECOG PERFORMANCE STATUS: 1 - Symptomatic but completely ambulatory  Vitals:   07/28/16 1401  BP: (!) 147/60  Pulse: 84  Resp: 18  Temp: 98.3 F (36.8 C)   Filed Weights   07/28/16 1401  Weight: 161 lb 5 oz (73.2 kg)    GENERAL:alert, no distress and comfortable SKIN: skin color, texture, turgor are normal, no rashes or significant lesions EYES: normal, Conjunctiva are pink and non-injected, sclera clear OROPHARYNX:no exudate, no erythema and lips, buccal mucosa, and tongue normal  NECK: supple, thyroid normal size, non-tender, without nodularity LYMPH:  no palpable lymphadenopathy in the cervical, axillary or inguinal LUNGS: clear to auscultation and percussion with normal breathing effort HEART: regular rate & rhythm and no murmurs and no lower extremity edema ABDOMEN:abdomen soft, non-tender and normal bowel sounds Musculoskeletal:no cyanosis of digits and no clubbing  NEURO: alert & oriented x 3 with fluent speech, no focal motor/sensory deficits  LABORATORY DATA:  I have reviewed the data as listed    Component Value Date/Time   NA 139 04/14/2016 0832   NA 136 03/31/2013 1414   K 4.5 04/14/2016 0832   K 4.0 03/31/2013 1414   CL 103 04/14/2016 0832   CO2 28 04/14/2016 0832   CO2 24 03/31/2013 1414   GLUCOSE 88 04/14/2016 0832   GLUCOSE 178 (H) 03/31/2013 1414   BUN 11 04/14/2016 0832   BUN 8.1 03/31/2013 1414   CREATININE 0.72 04/14/2016 0832   CREATININE 0.7 03/31/2013 1414    CALCIUM 10.0 04/14/2016 0832   CALCIUM 10.1 03/31/2013 1414   PROT 7.4 04/14/2016 0832   PROT 7.2 03/31/2013 1414   ALBUMIN 4.3 04/14/2016 0832   ALBUMIN 3.9 03/31/2013 1414   AST 18 04/14/2016 0832   AST 19 03/31/2013 1414   ALT 17 04/14/2016 0832   ALT 15 03/31/2013 1414   ALKPHOS 41 04/14/2016 0832   ALKPHOS 55 03/31/2013 1414   BILITOT 0.4 04/14/2016 0832   BILITOT 0.28 03/31/2013 1414   GFRNONAA 112.68 10/29/2009 1221    No results found for: SPEP, UPEP  Lab Results  Component Value Date   WBC 7.0 07/28/2016   NEUTROABS 3.5 07/28/2016   HGB 11.0 (L) 07/28/2016   HCT 32.1 (L) 07/28/2016   MCV 97.0 07/28/2016   PLT 518 (H) 07/28/2016      Chemistry      Component Value Date/Time   NA 139 04/14/2016 0832   NA 136 03/31/2013 1414   K 4.5 04/14/2016 0832   K 4.0 03/31/2013 1414   CL 103 04/14/2016 0832   CO2 28 04/14/2016 0832  CO2 24 03/31/2013 1414   BUN 11 04/14/2016 0832   BUN 8.1 03/31/2013 1414   CREATININE 0.72 04/14/2016 0832   CREATININE 0.7 03/31/2013 1414      Component Value Date/Time   CALCIUM 10.0 04/14/2016 0832   CALCIUM 10.1 03/31/2013 1414   ALKPHOS 41 04/14/2016 0832   ALKPHOS 55 03/31/2013 1414   AST 18 04/14/2016 0832   AST 19 03/31/2013 1414   ALT 17 04/14/2016 0832   ALT 15 03/31/2013 1414   BILITOT 0.4 04/14/2016 0832   BILITOT 0.28 03/31/2013 1414       ASSESSMENT & PLAN:  Essential thrombocythemia She is doing well. She was not able to get anagrelide approved/filled. Even though her platelet count is suboptimally controlled, the patient is not symptomatic and has never been diagnosed with blood clot. There is no concurrent leukocytosis. She has evidence of anemia and I am reluctant to increase the dose of hydroxyurea further. I recommend she continues taking hydroxyurea 1 tablet daily without change in dose. I will see her back in 6 months for further follow-up.  Anemia in neoplastic disease This is likely due to recent  treatment and poorly controlled DM. The patient denies recent history of bleeding such as epistaxis, hematuria or hematochezia. She is asymptomatic from the anemia. I will observe for now.   Diabetes mellitus type 2, uncontrolled, without complications ([place]) She has recent poorly controlled diabetes due to inability to exercise and stress. She appears to be motivated to exercise more and lose weight along with dietary modification.  I would defer to her primary care doctor for further management  Essential hypertension she will continue current medical management. There may be an element of whitecoat hypertension here. I recommend close follow-up with primary care doctor for medication adjustment.    No orders of the defined types were placed in this encounter.  All questions were answered. The patient knows to call the clinic with any problems, questions or concerns. No barriers to learning was detected. I spent 15 minutes counseling the patient face to face. The total time spent in the appointment was 20 minutes and more than 50% was on counseling and review of test results     Heath Lark, MD 07/28/2016 2:27 PM

## 2016-07-28 NOTE — Assessment & Plan Note (Signed)
She is doing well. She was not able to get anagrelide approved/filled. Even though her platelet count is suboptimally controlled, the patient is not symptomatic and has never been diagnosed with blood clot. There is no concurrent leukocytosis. She has evidence of anemia and I am reluctant to increase the dose of hydroxyurea further. I recommend she continues taking hydroxyurea 1 tablet daily without change in dose. I will see her back in 6 months for further follow-up. 

## 2016-07-28 NOTE — Telephone Encounter (Signed)
Appointments scheduled per 3.16.18 LOS. Patient given AVS report and calendars with future scheduled appointments.  °

## 2016-07-28 NOTE — Assessment & Plan Note (Signed)
She has recent poorly controlled diabetes due to inability to exercise and stress. She appears to be motivated to exercise more and lose weight along with dietary modification.  I would defer to her primary care doctor for further management

## 2016-10-13 ENCOUNTER — Encounter: Payer: Self-pay | Admitting: Internal Medicine

## 2016-10-13 ENCOUNTER — Ambulatory Visit (INDEPENDENT_AMBULATORY_CARE_PROVIDER_SITE_OTHER): Payer: Medicare Other | Admitting: Internal Medicine

## 2016-10-13 ENCOUNTER — Other Ambulatory Visit: Payer: Self-pay | Admitting: Internal Medicine

## 2016-10-13 VITALS — BP 120/82 | HR 85 | Temp 98.2°F | Wt 160.0 lb

## 2016-10-13 DIAGNOSIS — I10 Essential (primary) hypertension: Secondary | ICD-10-CM | POA: Diagnosis not present

## 2016-10-13 DIAGNOSIS — I471 Supraventricular tachycardia, unspecified: Secondary | ICD-10-CM

## 2016-10-13 DIAGNOSIS — D473 Essential (hemorrhagic) thrombocythemia: Secondary | ICD-10-CM | POA: Diagnosis not present

## 2016-10-13 DIAGNOSIS — Z23 Encounter for immunization: Secondary | ICD-10-CM | POA: Diagnosis not present

## 2016-10-13 DIAGNOSIS — E1165 Type 2 diabetes mellitus with hyperglycemia: Secondary | ICD-10-CM

## 2016-10-13 DIAGNOSIS — IMO0001 Reserved for inherently not codable concepts without codable children: Secondary | ICD-10-CM

## 2016-10-13 LAB — HEMOGLOBIN A1C: Hgb A1c MFr Bld: 9.1 % — ABNORMAL HIGH (ref 4.6–6.5)

## 2016-10-13 MED ORDER — LIRAGLUTIDE 18 MG/3ML ~~LOC~~ SOPN: 1.2000 mg | PEN_INJECTOR | Freq: Every day | SUBCUTANEOUS | 11 refills | Status: DC

## 2016-10-13 NOTE — Assessment & Plan Note (Signed)
Controlled with the hydroxyurea

## 2016-10-13 NOTE — Progress Notes (Signed)
Subjective:    Patient ID: Savannah Evans, female    DOB: 1950/11/27, 66 y.o.   MRN: [id number]  HPI Here for follow up of diabetes and other health conditions  Notes night cramps at times In calves only Has to get up to get it to stop Still working on renovations and active---not sure if I is related to this Discussed hydration and other remedies  Doing fine on the hydroxyurea No recent change in the dose Only mild anemia  No palpitations No chest pain No SOB No edema No dizziness or syncope  Trying to walk at least 30 minutes during work day (at least 3 days per week) Checks sugars occasionally--weekly Fasting 150-200 Feels okay No foot numbness, sores or pain  Current Outpatient Prescriptions on File Prior to Visit  Medication Sig Dispense Refill  . Ascorbic Acid (VITAMIN C) 500 MG tablet Take 500 mg by mouth daily.      Marland Kitchen aspirin 81 MG tablet Take 81 mg by mouth daily.      . calcium-vitamin D (OSCAL) 250-125 MG-UNIT per tablet Take 1 tablet by mouth daily.      . cetirizine (ZYRTEC) 10 MG tablet Take 10 mg by mouth daily as needed for allergies.     . fish oil-omega-3 fatty acids 1000 MG capsule Take 1 g by mouth daily.     Marland Kitchen glipiZIDE (GLUCOTROL) 5 MG tablet TAKE ONE TABLET BY MOUTH TWICE DAILY BEFORE MEALS 180 tablet 3  . hydroxyurea (HYDREA) 500 MG capsule Take 1 capsule (500 mg total) by mouth daily. 90 capsule 3  . metFORMIN (GLUCOPHAGE) 1000 MG tablet TAKE ONE TABLET BY MOUTH TWICE DAILY WITH MEALS 180 tablet 3  . Multiple Vitamin (MULTIVITAMIN) tablet Take 1 tablet by mouth daily.      Marland Kitchen omeprazole (PRILOSEC OTC) 20 MG tablet Take 20 mg by mouth daily.     . simvastatin (ZOCOR) 10 MG tablet Take 1 tablet (10 mg total) by mouth daily. 90 tablet 3  . sitaGLIPtin (JANUVIA) 100 MG tablet Take 1 tablet (100 mg total) by mouth daily. 90 tablet 3   No current facility-administered medications on file prior to visit.     No Known Allergies  Past Medical  History:  Diagnosis Date  . Allergic rhinitis   . Anemia, unspecified 03/03/2013  . Diabetes mellitus type 2, noninsulin dependent ([place])   . Diaphragmatic hernia   . Essential thrombocythemia ([place]) 10/21/2012   JAK2 neg; BCR-ABL neg; bone marrow biopsy on 09/29/12 showed normal cytogenetics.   Marland Kitchen GERD (gastroesophageal reflux disease)   . History of palpitations   . Hyperlipidemia   . Iron deficiency   . Osteoarthritis   . SVT (supraventricular tachycardia) ([facility])     Past Surgical History:  Procedure Laterality Date  . COLONOSCOPY     neg age 78.  Next age 63.   . svt ablation  2000   Dr. Caryl Comes    Family History  Problem Relation Age of Onset  . Heart attack Father   . Uterine cancer Sister   . Diabetes Sister   . Macular degeneration Mother   . Cancer Paternal Aunt        cancer?  . Cancer Paternal Uncle        cancer?  . Colon cancer Neg Hx     Social History   Social History  . Marital status: Married    Spouse name: N/A  . Number of children: 3  . Years of  education: N/A   Occupational History  . Manages loan administration area for [employer]    Social History Main Topics  . Smoking status: Never Smoker  . Smokeless tobacco: Never Used  . Alcohol use 0.6 oz/week    1 Glasses of wine per week     Comment: social  . Drug use: No  . Sexual activity: Not on file   Other Topics Concern  . Not on file   Social History Narrative   Has living will   Requests husband as health care POA   Would accept resuscitation attempts   No tube feeds if cognitively unaware   Review of Systems Weight is stable Appetite is okay Generally sleeps okay (other than the occasional cramps)    Objective:   Physical Exam  Constitutional: She appears well-developed and well-nourished. No distress.  Neck: No thyromegaly present.  Cardiovascular: Normal rate, regular rhythm, normal heart sounds and intact distal pulses.  Exam reveals no gallop.   No murmur  heard. Pulmonary/Chest: Effort normal and breath sounds normal. No respiratory distress. She has no wheezes. She has no rales.  Lymphadenopathy:    She has no cervical adenopathy.  Skin: No rash noted. No erythema.  No foot lesions  Psychiatric: She has a normal mood and affect. Her behavior is normal.          Assessment & Plan:

## 2016-10-13 NOTE — Assessment & Plan Note (Signed)
BP Readings from Last 3 Encounters:  10/13/16 120/82  07/28/16 (!) 147/60  04/14/16 134/72   Okay now Will hold off on medications Urine microal negative

## 2016-10-13 NOTE — Assessment & Plan Note (Signed)
No apparent recurrence No Rx needed

## 2016-10-13 NOTE — Addendum Note (Signed)
Addended by: Pilar Grammes on: 10/13/2016 09:00 AM   Modules accepted: Orders

## 2016-10-13 NOTE — Assessment & Plan Note (Signed)
Hopefully not any worse Discussed lifestyle Will add liraglutide if well over 8%

## 2016-10-17 ENCOUNTER — Telehealth: Payer: Self-pay | Admitting: Internal Medicine

## 2016-10-17 NOTE — Telephone Encounter (Signed)
See labs 

## 2016-10-17 NOTE — Telephone Encounter (Signed)
Pt returned your call best number [phone]

## 2016-10-18 ENCOUNTER — Telehealth: Payer: Self-pay

## 2016-10-18 MED ORDER — PEN NEEDLES 32G X 4 MM MISC: 0.2000 mL | Freq: Every day | 1 refills | Status: DC

## 2016-10-18 NOTE — Telephone Encounter (Signed)
Pt request pen needles to [facility]. Pen needles sent to [facility] per protocol and pt voiced understanding. Pt wants to know what time of day Dr Silvio Pate wants pt to take victoza. The prescribing sheet for victoza said can take any time of day; pt wants to know if Dr Silvio Pate thinks can take Liraglutide at night. Pt request cb.

## 2016-10-19 NOTE — Telephone Encounter (Signed)
She can take it anytime that works best for her. It should just be consistent Whatever time she can best remember it and works with her schedule  Some people have mild GI symptoms--that might be better (or possibly worse) if taken at bedtime

## 2016-10-19 NOTE — Telephone Encounter (Signed)
Spoke to pt. She appreciated the call.  

## 2016-10-19 NOTE — Telephone Encounter (Signed)
Pt wants to know what time of day Dr Silvio Pate wants her to take the Victoza

## 2016-11-12 ENCOUNTER — Other Ambulatory Visit: Payer: Self-pay | Admitting: Internal Medicine

## 2016-11-26 ENCOUNTER — Telehealth: Payer: Self-pay

## 2016-11-26 NOTE — Telephone Encounter (Signed)
Called and left a message with a new appt due to dr gorsuch out of office  [employer] 

## 2016-12-09 ENCOUNTER — Other Ambulatory Visit: Payer: Self-pay | Admitting: Internal Medicine

## 2016-12-27 DIAGNOSIS — E119 Type 2 diabetes mellitus without complications: Secondary | ICD-10-CM | POA: Diagnosis not present

## 2017-01-04 ENCOUNTER — Other Ambulatory Visit: Payer: Self-pay | Admitting: Internal Medicine

## 2017-01-19 ENCOUNTER — Ambulatory Visit (INDEPENDENT_AMBULATORY_CARE_PROVIDER_SITE_OTHER): Payer: Medicare Other | Admitting: Internal Medicine

## 2017-01-19 ENCOUNTER — Encounter: Payer: Self-pay | Admitting: Internal Medicine

## 2017-01-19 VITALS — BP 130/78 | HR 82 | Temp 97.9°F | Wt 156.5 lb

## 2017-01-19 DIAGNOSIS — E1165 Type 2 diabetes mellitus with hyperglycemia: Secondary | ICD-10-CM | POA: Diagnosis not present

## 2017-01-19 DIAGNOSIS — IMO0001 Reserved for inherently not codable concepts without codable children: Secondary | ICD-10-CM

## 2017-01-19 LAB — HEMOGLOBIN A1C: HEMOGLOBIN A1C: 7 % — AB (ref 4.6–6.5)

## 2017-01-19 MED ORDER — LIRAGLUTIDE 18 MG/3ML ~~LOC~~ SOPN: 1.8000 mg | PEN_INJECTOR | Freq: Every day | SUBCUTANEOUS | 11 refills | Status: DC

## 2017-01-19 NOTE — Assessment & Plan Note (Addendum)
Has tolerated the victoza Will increase to 1.8mg  daily Stop the januvia--may be duplicate therapy to some extent and very expensive

## 2017-01-19 NOTE — Progress Notes (Signed)
Subjective:    Patient ID: Savannah Evans, female    DOB: 11/27/1950, 66 y.o.   MRN: [id number]  HPI Here for follow up of diabetes  Has tolerated the liraglutide--takes it at night Having trouble with the cost--- in donut hole  Checking sugars once a week Has been over 150 in the morning--but definitely better No low sugar reactions  Current Outpatient Prescriptions on File Prior to Visit  Medication Sig Dispense Refill  . Ascorbic Acid (VITAMIN C) 500 MG tablet Take 500 mg by mouth daily.      Marland Kitchen aspirin 81 MG tablet Take 81 mg by mouth daily.      . calcium-vitamin D (OSCAL) 250-125 MG-UNIT per tablet Take 1 tablet by mouth daily.      . cetirizine (ZYRTEC) 10 MG tablet Take 10 mg by mouth daily as needed for allergies.     . fish oil-omega-3 fatty acids 1000 MG capsule Take 1 g by mouth daily.     Marland Kitchen glipiZIDE (GLUCOTROL) 5 MG tablet TAKE ONE TABLET BY MOUTH TWICE DAILY BEFORE MEAL(S) 180 tablet 3  . hydroxyurea (HYDREA) 500 MG capsule Take 1 capsule (500 mg total) by mouth daily. 90 capsule 3  . Insulin Pen Needle (PEN NEEDLES) 32G X 4 MM MISC Inject 0.2 mLs into the skin daily. Use pen needles to inject Liraglutide daily and as directed. Dx E11.65 100 each 1  . JANUVIA 100 MG tablet TAKE ONE TABLET BY MOUTH ONCE DAILY 90 tablet 3  . liraglutide 18 MG/3ML SOPN Inject 0.2 mLs (1.2 mg total) into the skin daily. 6 mL 11  . metFORMIN (GLUCOPHAGE) 1000 MG tablet TAKE ONE TABLET BY MOUTH TWICE DAILY WITH MEALS 180 tablet 3  . Multiple Vitamin (MULTIVITAMIN) tablet Take 1 tablet by mouth daily.      Marland Kitchen omeprazole (PRILOSEC OTC) 20 MG tablet Take 20 mg by mouth daily.     . simvastatin (ZOCOR) 10 MG tablet TAKE ONE TABLET BY MOUTH ONCE DAILY 90 tablet 1   No current facility-administered medications on file prior to visit.     No Known Allergies  Past Medical History:  Diagnosis Date  . Allergic rhinitis   . Anemia, unspecified 03/03/2013  . Diabetes mellitus type 2, noninsulin  dependent ([place])   . Diaphragmatic hernia   . Essential thrombocythemia ([place]) 10/21/2012   JAK2 neg; BCR-ABL neg; bone marrow biopsy on 09/29/12 showed normal cytogenetics.   Marland Kitchen GERD (gastroesophageal reflux disease)   . History of palpitations   . Hyperlipidemia   . Iron deficiency   . Osteoarthritis   . SVT (supraventricular tachycardia) ([facility])     Past Surgical History:  Procedure Laterality Date  . COLONOSCOPY     neg age 37.  Next age 31.   . svt ablation  2000   Dr. Caryl Comes    Family History  Problem Relation Age of Onset  . Heart attack Father   . Uterine cancer Sister   . Diabetes Sister   . Macular degeneration Mother   . Cancer Paternal Aunt        cancer?  . Cancer Paternal Uncle        cancer?  . Colon cancer Neg Hx     Social History   Social History  . Marital status: Married    Spouse name: N/A  . Number of children: 3  . Years of education: N/A   Occupational History  . 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 Smoking status: Never Smoker  . Smokeless tobacco: Never Used  . Alcohol use 0.6 oz/week    1 Glasses of wine per week     Comment: social  . Drug use: No  . Sexual activity: Not on file   Other Topics Concern  . Not on file   Social History Narrative   Has living will   Requests husband as health care POA   Would accept resuscitation attempts   No tube feeds if cognitively unaware    Review of Systems Weight is down slightly No abdominal pain or bloating No N/V Bowels are okay    Objective:   Physical Exam  Constitutional: No distress.  Psychiatric: She has a normal mood and affect. Her behavior is normal.          Assessment & Plan:

## 2017-02-02 ENCOUNTER — Ambulatory Visit: Payer: Medicare Other | Admitting: Hematology and Oncology

## 2017-02-02 ENCOUNTER — Other Ambulatory Visit: Payer: Medicare Other

## 2017-02-05 ENCOUNTER — Telehealth: Payer: Self-pay | Admitting: Hematology and Oncology

## 2017-02-05 ENCOUNTER — Ambulatory Visit (HOSPITAL_BASED_OUTPATIENT_CLINIC_OR_DEPARTMENT_OTHER): Payer: Medicare Other | Admitting: Hematology and Oncology

## 2017-02-05 ENCOUNTER — Other Ambulatory Visit (HOSPITAL_BASED_OUTPATIENT_CLINIC_OR_DEPARTMENT_OTHER): Payer: Medicare Other

## 2017-02-05 DIAGNOSIS — D630 Anemia in neoplastic disease: Secondary | ICD-10-CM | POA: Diagnosis not present

## 2017-02-05 DIAGNOSIS — D473 Essential (hemorrhagic) thrombocythemia: Secondary | ICD-10-CM

## 2017-02-05 LAB — CBC WITH DIFFERENTIAL/PLATELET
BASO%: 0.4 % (ref 0.0–2.0)
Basophils Absolute: 0 10*3/uL (ref 0.0–0.1)
EOS%: 2 % (ref 0.0–7.0)
Eosinophils Absolute: 0.1 10*3/uL (ref 0.0–0.5)
HCT: 31.6 % — ABNORMAL LOW (ref 34.8–46.6)
HGB: 10.5 g/dL — ABNORMAL LOW (ref 11.6–15.9)
LYMPH%: 40.5 % (ref 14.0–49.7)
MCH: 33.5 pg (ref 25.1–34.0)
MCHC: 33.2 g/dL (ref 31.5–36.0)
MCV: 101 fL (ref 79.5–101.0)
MONO#: 0.6 10*3/uL (ref 0.1–0.9)
MONO%: 8.1 % (ref 0.0–14.0)
NEUT#: 3.5 10*3/uL (ref 1.5–6.5)
NEUT%: 49 % (ref 38.4–76.8)
Platelets: 502 10*3/uL — ABNORMAL HIGH (ref 145–400)
RBC: 3.13 10*6/uL — ABNORMAL LOW (ref 3.70–5.45)
RDW: 12.7 % (ref 11.2–14.5)
WBC: 7.1 10*3/uL (ref 3.9–10.3)
lymph#: 2.9 10*3/uL (ref 0.9–3.3)

## 2017-02-05 MED ORDER — HYDROXYUREA 500 MG PO CAPS: 500.0000 mg | ORAL_CAPSULE | Freq: Every day | ORAL | 3 refills | Status: DC

## 2017-02-05 NOTE — Telephone Encounter (Signed)
Gave patient AVS and calendar of upcoming March 2019 appointments.  °

## 2017-02-06 ENCOUNTER — Encounter: Payer: Self-pay | Admitting: Hematology and Oncology

## 2017-02-06 NOTE — Assessment & Plan Note (Signed)
She is doing well. She was not able to get anagrelide approved/filled. Even though her platelet count is suboptimally controlled, the patient is not symptomatic and has never been diagnosed with blood clot. There is no concurrent leukocytosis. She has evidence of anemia and I am reluctant to increase the dose of hydroxyurea further. I recommend she continues taking hydroxyurea 1 tablet daily without change in dose. I will see her back in 6 months for further follow-up.

## 2017-02-06 NOTE — Assessment & Plan Note (Signed)
This is likely due to recent treatment and poorly controlled DM. The patient denies recent history of bleeding such as epistaxis, hematuria or hematochezia. She is asymptomatic from the anemia. I will observe for now.  

## 2017-02-06 NOTE — Progress Notes (Signed)
[facility] OFFICE PROGRESS NOTE  Patient Care Team: [name], MD as PCP - General Barbaraann Rondo, MD as Attending Physician (Obstetrics and Gynecology) Heath Lark, MD as Consulting Physician (Hematology and Oncology)  SUMMARY OF ONCOLOGIC HISTORY:  This patient used to be a blood donor. She was refused blood donation for several years due to anemia. A review of her CBC dated back to 2012 in note of mild anemia with associated thrombocytosis. In May of this year, she underwent extensive evaluation. Iron studies show that she had borderline iron deficiency. In June 2014, she had a bone marrow aspirate and biopsy. Results are inconclusive but there are presence of atypical megakaryocytes. Cytogenetics a study was normal. The patient was started on hydroxyurea. MPL mutation, BCR/ABL and JAK2 mutation testing was negative In December 2014, she had EGD and colonoscopy for evaluation of iron deficiency anemia and the tests were negative apart from mild nonspecific gastritis. She was placed on iron supplements, subsequently discontinued in January 2015 On 01/08/2014, dose of hydroxyurea was modified to. She is instructed to take 6 days a week and skip on Sundays. Since September 2015, she reduce hydroxyurea by skipping doses on Wednesday and Sundays On 05/04/2014, I reduce hydroxyurea dose further to 500 mg 4 days a week and to add anagrelide. Due to difficulties obtaining the pills, she had not been able to take hydroxyurea and anagrelide. On 06/04/14: dose of hydroxyurea is increased to 500 mg daily.  INTERVAL HISTORY: Please see below for problem oriented charting. She returns for further follow-up She has been compliant taking hydroxyurea as directed She denies recent diagnosis of blood clot No recent bleeding She denies fatigue, chest pain or shortness of breath with anemia  REVIEW OF SYSTEMS:   Constitutional: Denies fevers, chills or abnormal weight loss Eyes:  Denies blurriness of vision Ears, nose, mouth, throat, and face: Denies mucositis or sore throat Respiratory: Denies cough, dyspnea or wheezes Cardiovascular: Denies palpitation, chest discomfort or lower extremity swelling Gastrointestinal:  Denies nausea, heartburn or change in bowel habits Skin: Denies abnormal skin rashes Lymphatics: Denies new lymphadenopathy or easy bruising Neurological:Denies numbness, tingling or new weaknesses Behavioral/Psych: Mood is stable, no new changes  All other systems were reviewed with the patient and are negative.  I have reviewed the past medical history, past surgical history, social history and family history with the patient and they are unchanged from previous note.  ALLERGIES:  has No Known Allergies.  MEDICATIONS:  Current Outpatient Prescriptions  Medication Sig Dispense Refill  . Ascorbic Acid (VITAMIN C) 500 MG tablet Take 500 mg by mouth daily.      Marland Kitchen aspirin 81 MG tablet Take 81 mg by mouth daily.      . calcium-vitamin D (OSCAL) 250-125 MG-UNIT per tablet Take 1 tablet by mouth daily.      . cetirizine (ZYRTEC) 10 MG tablet Take 10 mg by mouth daily as needed for allergies.     . fish oil-omega-3 fatty acids 1000 MG capsule Take 1 g by mouth daily.     Marland Kitchen glipiZIDE (GLUCOTROL) 5 MG tablet TAKE ONE TABLET BY MOUTH TWICE DAILY BEFORE MEAL(S) 180 tablet 3  . hydroxyurea (HYDREA) 500 MG capsule Take 1 capsule (500 mg total) by mouth daily. 90 capsule 3  . Insulin Pen Needle (PEN NEEDLES) 32G X 4 MM MISC Inject 0.2 mLs into the skin daily. Use pen needles to inject Liraglutide daily and as directed. Dx E11.65 100 each 1  .  liraglutide 18 MG/3ML SOPN Inject 0.3 mLs (1.8 mg total) into the skin daily. 9 mL 11  . metFORMIN (GLUCOPHAGE) 1000 MG tablet TAKE ONE TABLET BY MOUTH TWICE DAILY WITH MEALS 180 tablet 3  . Multiple Vitamin (MULTIVITAMIN) tablet Take 1 tablet by mouth daily.      Marland Kitchen omeprazole (PRILOSEC OTC) 20 MG tablet Take 20 mg by mouth  daily.     . simvastatin (ZOCOR) 10 MG tablet TAKE ONE TABLET BY MOUTH ONCE DAILY 90 tablet 1   No current facility-administered medications for this visit.     PHYSICAL EXAMINATION: ECOG PERFORMANCE STATUS: 1 - Symptomatic but completely ambulatory  Vitals:   02/05/17 1355  BP: (!) 142/66  Pulse: 86  Resp: 17  Temp: 98.7 F (37.1 C)  SpO2: 100%   Filed Weights   02/05/17 1355  Weight: 159 lb 12.8 oz (72.5 kg)    GENERAL:alert, no distress and comfortable SKIN: skin color, texture, turgor are normal, no rashes or significant lesions EYES: normal, Conjunctiva are pink and non-injected, sclera clear OROPHARYNX:no exudate, no erythema and lips, buccal mucosa, and tongue normal  NECK: supple, thyroid normal size, non-tender, without nodularity LYMPH:  no palpable lymphadenopathy in the cervical, axillary or inguinal LUNGS: clear to auscultation and percussion with normal breathing effort HEART: regular rate & rhythm and no murmurs and no lower extremity edema ABDOMEN:abdomen soft, non-tender and normal bowel sounds Musculoskeletal:no cyanosis of digits and no clubbing  NEURO: alert & oriented x 3 with fluent speech, no focal motor/sensory deficits  LABORATORY DATA:  I have reviewed the data as listed    Component Value Date/Time   NA 139 04/14/2016 0832   NA 136 03/31/2013 1414   K 4.5 04/14/2016 0832   K 4.0 03/31/2013 1414   CL 103 04/14/2016 0832   CO2 28 04/14/2016 0832   CO2 24 03/31/2013 1414   GLUCOSE 88 04/14/2016 0832   GLUCOSE 178 (H) 03/31/2013 1414   BUN 11 04/14/2016 0832   BUN 8.1 03/31/2013 1414   CREATININE 0.72 04/14/2016 0832   CREATININE 0.7 03/31/2013 1414   CALCIUM 10.0 04/14/2016 0832   CALCIUM 10.1 03/31/2013 1414   PROT 7.4 04/14/2016 0832   PROT 7.2 03/31/2013 1414   ALBUMIN 4.3 04/14/2016 0832   ALBUMIN 3.9 03/31/2013 1414   AST 18 04/14/2016 0832   AST 19 03/31/2013 1414   ALT 17 04/14/2016 0832   ALT 15 03/31/2013 1414   ALKPHOS 41  04/14/2016 0832   ALKPHOS 55 03/31/2013 1414   BILITOT 0.4 04/14/2016 0832   BILITOT 0.28 03/31/2013 1414   GFRNONAA 112.68 10/29/2009 1221    No results found for: SPEP, UPEP  Lab Results  Component Value Date   WBC 7.1 02/05/2017   NEUTROABS 3.5 02/05/2017   HGB 10.5 (L) 02/05/2017   HCT 31.6 (L) 02/05/2017   MCV 101.0 02/05/2017   PLT 502 (H) 02/05/2017      Chemistry      Component Value Date/Time   NA 139 04/14/2016 0832   NA 136 03/31/2013 1414   K 4.5 04/14/2016 0832   K 4.0 03/31/2013 1414   CL 103 04/14/2016 0832   CO2 28 04/14/2016 0832   CO2 24 03/31/2013 1414   BUN 11 04/14/2016 0832   BUN 8.1 03/31/2013 1414   CREATININE 0.72 04/14/2016 0832   CREATININE 0.7 03/31/2013 1414      Component Value Date/Time   CALCIUM 10.0 04/14/2016 0832   CALCIUM 10.1 03/31/2013 1414  ALKPHOS 41 04/14/2016 0832   ALKPHOS 55 03/31/2013 1414   AST 18 04/14/2016 0832   AST 19 03/31/2013 1414   ALT 17 04/14/2016 0832   ALT 15 03/31/2013 1414   BILITOT 0.4 04/14/2016 0832   BILITOT 0.28 03/31/2013 1414      ASSESSMENT & PLAN:  Essential thrombocythemia She is doing well. She was not able to get anagrelide approved/filled. Even though her platelet count is suboptimally controlled, the patient is not symptomatic and has never been diagnosed with blood clot. There is no concurrent leukocytosis. She has evidence of anemia and I am reluctant to increase the dose of hydroxyurea further. I recommend she continues taking hydroxyurea 1 tablet daily without change in dose. I will see her back in 6 months for further follow-up.  Anemia in neoplastic disease This is likely due to recent treatment and poorly controlled DM. The patient denies recent history of bleeding such as epistaxis, hematuria or hematochezia. She is asymptomatic from the anemia. I will observe for now.    No orders of the defined types were placed in this encounter.  All questions were answered. The  patient knows to call the clinic with any problems, questions or concerns. No barriers to learning was detected. I spent 10 minutes counseling the patient face to face. The total time spent in the appointment was 15 minutes and more than 50% was on counseling and review of test results     Heath Lark, MD 02/06/2017 4:35 PM

## 2017-02-22 DIAGNOSIS — L570 Actinic keratosis: Secondary | ICD-10-CM | POA: Diagnosis not present

## 2017-02-22 DIAGNOSIS — L578 Other skin changes due to chronic exposure to nonionizing radiation: Secondary | ICD-10-CM | POA: Diagnosis not present

## 2017-02-22 DIAGNOSIS — D485 Neoplasm of uncertain behavior of skin: Secondary | ICD-10-CM | POA: Diagnosis not present

## 2017-02-22 DIAGNOSIS — C44311 Basal cell carcinoma of skin of nose: Secondary | ICD-10-CM | POA: Diagnosis not present

## 2017-03-20 DIAGNOSIS — L578 Other skin changes due to chronic exposure to nonionizing radiation: Secondary | ICD-10-CM | POA: Diagnosis not present

## 2017-03-20 DIAGNOSIS — C44311 Basal cell carcinoma of skin of nose: Secondary | ICD-10-CM | POA: Diagnosis not present

## 2017-04-11 DIAGNOSIS — L570 Actinic keratosis: Secondary | ICD-10-CM | POA: Diagnosis not present

## 2017-04-11 DIAGNOSIS — L578 Other skin changes due to chronic exposure to nonionizing radiation: Secondary | ICD-10-CM | POA: Diagnosis not present

## 2017-04-11 DIAGNOSIS — L720 Epidermal cyst: Secondary | ICD-10-CM | POA: Diagnosis not present

## 2017-04-11 DIAGNOSIS — C44311 Basal cell carcinoma of skin of nose: Secondary | ICD-10-CM | POA: Diagnosis not present

## 2017-04-11 DIAGNOSIS — L812 Freckles: Secondary | ICD-10-CM | POA: Diagnosis not present

## 2017-04-11 DIAGNOSIS — L821 Other seborrheic keratosis: Secondary | ICD-10-CM | POA: Diagnosis not present

## 2017-04-13 ENCOUNTER — Ambulatory Visit (INDEPENDENT_AMBULATORY_CARE_PROVIDER_SITE_OTHER): Payer: Medicare Other | Admitting: Internal Medicine

## 2017-04-13 ENCOUNTER — Encounter: Payer: Self-pay | Admitting: Internal Medicine

## 2017-04-13 VITALS — BP 134/76 | HR 74 | Temp 98.1°F | Wt 158.5 lb

## 2017-04-13 DIAGNOSIS — Z23 Encounter for immunization: Secondary | ICD-10-CM | POA: Diagnosis not present

## 2017-04-13 DIAGNOSIS — E119 Type 2 diabetes mellitus without complications: Secondary | ICD-10-CM | POA: Diagnosis not present

## 2017-04-13 LAB — HEMOGLOBIN A1C: HEMOGLOBIN A1C: 7.2 % — AB (ref 4.6–6.5)

## 2017-04-13 LAB — HM DIABETES FOOT EXAM

## 2017-04-13 NOTE — Assessment & Plan Note (Signed)
Doing well with liraglutide She will check cost of trulicity Check V8A

## 2017-04-13 NOTE — Progress Notes (Signed)
Subjective:    Patient ID: Savannah Evans, female    DOB: 02/24/1951, 66 y.o.   MRN: [id number]  HPI Here for follow up of diabetes  Did increase the liraglutide---no GI problems or other concerns Stopped the [employer] not as much of an issue since off the [place]  Checks sugars once every 2 weeks Usually in 130-140's fasting No hypoglycemic reactions  Has felt really good  Current Outpatient Medications on File Prior to Visit  Medication Sig Dispense Refill  . Ascorbic Acid (VITAMIN C) 500 MG tablet Take 500 mg by mouth daily.      Marland Kitchen aspirin 81 MG tablet Take 81 mg by mouth daily.      . calcium-vitamin D (OSCAL) 250-125 MG-UNIT per tablet Take 1 tablet by mouth daily.      . cetirizine (ZYRTEC) 10 MG tablet Take 10 mg by mouth daily as needed for allergies.     . fish oil-omega-3 fatty acids 1000 MG capsule Take 1 g by mouth daily.     Marland Kitchen glipiZIDE (GLUCOTROL) 5 MG tablet TAKE ONE TABLET BY MOUTH TWICE DAILY BEFORE MEAL(S) 180 tablet 3  . hydroxyurea (HYDREA) 500 MG capsule Take 1 capsule (500 mg total) by mouth daily. 90 capsule 3  . Insulin Pen Needle (PEN NEEDLES) 32G X 4 MM MISC Inject 0.2 mLs into the skin daily. Use pen needles to inject Liraglutide daily and as directed. Dx E11.65 100 each 1  . liraglutide 18 MG/3ML SOPN Inject 0.3 mLs (1.8 mg total) into the skin daily. 9 mL 11  . metFORMIN (GLUCOPHAGE) 1000 MG tablet TAKE ONE TABLET BY MOUTH TWICE DAILY WITH MEALS 180 tablet 3  . Multiple Vitamin (MULTIVITAMIN) tablet Take 1 tablet by mouth daily.      Marland Kitchen omeprazole (PRILOSEC OTC) 20 MG tablet Take 20 mg by mouth daily.     . simvastatin (ZOCOR) 10 MG tablet TAKE ONE TABLET BY MOUTH ONCE DAILY 90 tablet 1   No current facility-administered medications on file prior to visit.     No Known Allergies  Past Medical History:  Diagnosis Date  . Allergic rhinitis   . Anemia, unspecified 03/03/2013  . Diabetes mellitus type 2, noninsulin dependent ([place])   .  Diaphragmatic hernia   . Essential thrombocythemia ([place]) 10/21/2012   JAK2 neg; BCR-ABL neg; bone marrow biopsy on 09/29/12 showed normal cytogenetics.   Marland Kitchen GERD (gastroesophageal reflux disease)   . History of palpitations   . Hyperlipidemia   . Iron deficiency   . Osteoarthritis   . SVT (supraventricular tachycardia) ([facility])     Past Surgical History:  Procedure Laterality Date  . COLONOSCOPY     neg age 89.  Next age 86.   . svt ablation  2000   Dr. Caryl Comes    Family History  Problem Relation Age of Onset  . Heart attack Father   . Uterine cancer Sister   . Diabetes Sister   . Macular degeneration Mother   . Cancer Paternal Aunt        cancer?  . Cancer Paternal Uncle        cancer?  . Colon cancer Neg Hx     Social History   Socioeconomic History  . Marital status: Married    Spouse name: Not on file  . Number of children: 3  . Years of education: Not on file  . Highest education level: Not on file  Social Needs  . Financial resource strain: Not on  file  . Food insecurity - worry: Not on file  . Food insecurity - inability: Not on file  . Transportation needs - medical: Not on file  . Transportation needs - non-medical: Not on file  Occupational History  . Occupation: Automotive engineer area for [employer]  Tobacco Use  . Smoking status: Never Smoker  . Smokeless tobacco: Never Used  Substance and Sexual Activity  . Alcohol use: Yes    Alcohol/week: 0.6 oz    Types: 1 Glasses of wine per week    Comment: social  . Drug use: No  . Sexual activity: Not on file  Other Topics Concern  . Not on file  Social History Narrative   Has living will   Requests husband as health care POA   Would accept resuscitation attempts   No tube feeds if cognitively unaware   Review of Systems Appetite is fine Weight down slightly Due for Moh's surgery on nose in February    Objective:   Physical Exam  Constitutional: No distress.  Neck: No thyromegaly  present.  Cardiovascular: Normal rate, regular rhythm and normal heart sounds. Exam reveals no gallop.  No murmur heard. Pulmonary/Chest: Effort normal and breath sounds normal. No respiratory distress. She has no wheezes. She has no rales.  Lymphadenopathy:    She has no cervical adenopathy.  Neurological:  Normal sensation in feet  Skin: No rash noted. No erythema.  No foot lesions          Assessment & Plan:

## 2017-04-13 NOTE — Patient Instructions (Signed)
Check with your pharmacist about the cost of trulicity (this is similar but only once a week).

## 2017-04-25 ENCOUNTER — Other Ambulatory Visit: Payer: Self-pay | Admitting: Internal Medicine

## 2017-05-12 ENCOUNTER — Other Ambulatory Visit: Payer: Self-pay | Admitting: Internal Medicine

## 2017-06-11 DIAGNOSIS — L719 Rosacea, unspecified: Secondary | ICD-10-CM | POA: Diagnosis not present

## 2017-06-11 DIAGNOSIS — C44311 Basal cell carcinoma of skin of nose: Secondary | ICD-10-CM | POA: Diagnosis not present

## 2017-06-11 DIAGNOSIS — L570 Actinic keratosis: Secondary | ICD-10-CM | POA: Diagnosis not present

## 2017-07-12 DIAGNOSIS — C44311 Basal cell carcinoma of skin of nose: Secondary | ICD-10-CM | POA: Diagnosis not present

## 2017-07-30 DIAGNOSIS — D485 Neoplasm of uncertain behavior of skin: Secondary | ICD-10-CM | POA: Diagnosis not present

## 2017-07-30 DIAGNOSIS — C44321 Squamous cell carcinoma of skin of nose: Secondary | ICD-10-CM | POA: Diagnosis not present

## 2017-07-30 DIAGNOSIS — L570 Actinic keratosis: Secondary | ICD-10-CM | POA: Diagnosis not present

## 2017-07-30 DIAGNOSIS — L578 Other skin changes due to chronic exposure to nonionizing radiation: Secondary | ICD-10-CM | POA: Diagnosis not present

## 2017-07-30 DIAGNOSIS — Z85828 Personal history of other malignant neoplasm of skin: Secondary | ICD-10-CM | POA: Diagnosis not present

## 2017-08-10 ENCOUNTER — Inpatient Hospital Stay: Payer: Medicare Other | Attending: Hematology and Oncology | Admitting: Hematology and Oncology

## 2017-08-10 ENCOUNTER — Telehealth: Payer: Self-pay | Admitting: Hematology and Oncology

## 2017-08-10 ENCOUNTER — Inpatient Hospital Stay: Payer: Medicare Other

## 2017-08-10 ENCOUNTER — Encounter: Payer: Self-pay | Admitting: Hematology and Oncology

## 2017-08-10 DIAGNOSIS — D473 Essential (hemorrhagic) thrombocythemia: Secondary | ICD-10-CM

## 2017-08-10 DIAGNOSIS — Z85828 Personal history of other malignant neoplasm of skin: Secondary | ICD-10-CM | POA: Diagnosis not present

## 2017-08-10 DIAGNOSIS — Z7984 Long term (current) use of oral hypoglycemic drugs: Secondary | ICD-10-CM | POA: Insufficient documentation

## 2017-08-10 DIAGNOSIS — D630 Anemia in neoplastic disease: Secondary | ICD-10-CM

## 2017-08-10 DIAGNOSIS — E119 Type 2 diabetes mellitus without complications: Secondary | ICD-10-CM

## 2017-08-10 DIAGNOSIS — Z7982 Long term (current) use of aspirin: Secondary | ICD-10-CM | POA: Diagnosis not present

## 2017-08-10 DIAGNOSIS — E1165 Type 2 diabetes mellitus with hyperglycemia: Secondary | ICD-10-CM | POA: Diagnosis not present

## 2017-08-10 DIAGNOSIS — Z9889 Other specified postprocedural states: Secondary | ICD-10-CM | POA: Diagnosis not present

## 2017-08-10 LAB — CBC WITH DIFFERENTIAL/PLATELET
BASOS PCT: 1 %
Basophils Absolute: 0.1 10*3/uL (ref 0.0–0.1)
EOS ABS: 0.1 10*3/uL (ref 0.0–0.5)
Eosinophils Relative: 2 %
HEMATOCRIT: 30.5 % — AB (ref 34.8–46.6)
Hemoglobin: 10.6 g/dL — ABNORMAL LOW (ref 11.6–15.9)
LYMPHS ABS: 2.6 10*3/uL (ref 0.9–3.3)
Lymphocytes Relative: 39 %
MCH: 35.9 pg — ABNORMAL HIGH (ref 25.1–34.0)
MCHC: 34.9 g/dL (ref 31.5–36.0)
MCV: 102.8 fL — ABNORMAL HIGH (ref 79.5–101.0)
MONOS PCT: 10 %
Monocytes Absolute: 0.6 10*3/uL (ref 0.1–0.9)
NEUTROS ABS: 3.1 10*3/uL (ref 1.5–6.5)
Neutrophils Relative %: 48 %
Platelets: 586 10*3/uL — ABNORMAL HIGH (ref 145–400)
RBC: 2.96 MIL/uL — ABNORMAL LOW (ref 3.70–5.45)
RDW: 12.6 % (ref 11.2–14.5)
WBC: 6.5 10*3/uL (ref 3.9–10.3)

## 2017-08-10 NOTE — Assessment & Plan Note (Signed)
This is likely due to recent treatment and poorly controlled DM. The patient denies recent history of bleeding such as epistaxis, hematuria or hematochezia. She is asymptomatic from the anemia. I will observe for now.  

## 2017-08-10 NOTE — Assessment & Plan Note (Signed)
She is doing well. Even though her platelet count is suboptimally controlled, the patient is not symptomatic and has never been diagnosed with blood clot. There is no concurrent leukocytosis. She has evidence of anemia and I am reluctant to increase the dose of hydroxyurea further. I recommend she continues taking hydroxyurea 1 tablet daily without change in dose. The most likely bump of her platelet count could be reactive due to recent surgery. She has an appointment to see her primary care doctor in 2 months I recommend CBC blood check at that time and the patient will call me with the results If her platelet count is improved or stabilized, I will see her back in 6 months for further follow-up.

## 2017-08-10 NOTE — Assessment & Plan Note (Signed)
She has poorly controlled diabetes We discussed dietary changes and lifestyle modification.  She will continue her medication as directed by her primary care doctor.

## 2017-08-10 NOTE — Progress Notes (Signed)
[place] OFFICE PROGRESS NOTE  Patient Care Team: [name], MD as PCP - General Barbaraann Rondo, MD as Attending Physician (Obstetrics and Gynecology) Heath Lark, MD as Consulting Physician (Hematology and Oncology)  ASSESSMENT & PLAN:  Essential thrombocythemia She is doing well. Even though her platelet count is suboptimally controlled, the patient is not symptomatic and has never been diagnosed with blood clot. There is no concurrent leukocytosis. She has evidence of anemia and I am reluctant to increase the dose of hydroxyurea further. I recommend she continues taking hydroxyurea 1 tablet daily without change in dose. The most likely bump of her platelet count could be reactive due to recent surgery. She has an appointment to see her primary care doctor in 2 months I recommend CBC blood check at that time and the patient will call me with the results If her platelet count is improved or stabilized, I will see her back in 6 months for further follow-up.  Anemia in neoplastic disease This is likely due to recent treatment and poorly controlled DM. The patient denies recent history of bleeding such as epistaxis, hematuria or hematochezia. She is asymptomatic from the anemia. I will observe for now.   Controlled type 2 diabetes mellitus without complication, without long-term current use of insulin ([place]) She has poorly controlled diabetes We discussed dietary changes and lifestyle modification.  She will continue her medication as directed by her primary care doctor.  History of basal cell carcinoma (BCC) excision She had recent basal cell cancer surgery on her face She will follow closely with dermatologist I suspect this could have caused mild reactive thrombocytosis   No orders of the defined types were placed in this encounter.   INTERVAL HISTORY: Please see below for problem oriented charting. She returns for further follow-up She denies recent  infection She had surgery recently for removal of basal cell carcinoma from the right side of her nose She have a new lesion on the left side of the nose with appointment pending to see dermatologist She tolerated hydroxyurea well She does not miss any doses The patient denies any recent signs or symptoms of bleeding such as spontaneous epistaxis, hematuria or hematochezia.   SUMMARY OF ONCOLOGIC HISTORY:  This patient used to be a blood donor. She was refused blood donation for several years due to anemia. A review of her CBC dated back to 2012 in note of mild anemia with associated thrombocytosis. In May of this year, she underwent extensive evaluation. Iron studies show that she had borderline iron deficiency. In June 2014, she had a bone marrow aspirate and biopsy. Results are inconclusive but there are presence of atypical megakaryocytes. Cytogenetics a study was normal. The patient was started on hydroxyurea. MPL mutation, BCR/ABL and JAK2 mutation testing was negative In December 2014, she had EGD and colonoscopy for evaluation of iron deficiency anemia and the tests were negative apart from mild nonspecific gastritis. She was placed on iron supplements, subsequently discontinued in January 2015 On 01/08/2014, dose of hydroxyurea was modified to. She is instructed to take 6 days a week and skip on Sundays. Since September 2015, she reduce hydroxyurea by skipping doses on Wednesday and Sundays On 05/04/2014, I reduce hydroxyurea dose further to 500 mg 4 days a week and to add anagrelide. Due to difficulties obtaining the pills, she had not been able to take hydroxyurea and anagrelide. On 06/04/14: dose of hydroxyurea is increased to 500 mg daily.   REVIEW OF SYSTEMS:  Constitutional: Denies fevers, chills or abnormal weight loss Eyes: Denies blurriness of vision Ears, nose, mouth, throat, and face: Denies mucositis or sore throat Respiratory: Denies cough, dyspnea or  wheezes Cardiovascular: Denies palpitation, chest discomfort or lower extremity swelling Gastrointestinal:  Denies nausea, heartburn or change in bowel habits Skin: Denies abnormal skin rashes Lymphatics: Denies new lymphadenopathy or easy bruising Neurological:Denies numbness, tingling or new weaknesses Behavioral/Psych: Mood is stable, no new changes  All other systems were reviewed with the patient and are negative.  I have reviewed the past medical history, past surgical history, social history and family history with the patient and they are unchanged from previous note.  ALLERGIES:  has No Known Allergies.  MEDICATIONS:  Current Outpatient Medications  Medication Sig Dispense Refill  . Ascorbic Acid (VITAMIN C) 500 MG tablet Take 500 mg by mouth daily.      Marland Kitchen aspirin 81 MG tablet Take 81 mg by mouth daily.      . calcium-vitamin D (OSCAL) 250-125 MG-UNIT per tablet Take 1 tablet by mouth daily.      . cetirizine (ZYRTEC) 10 MG tablet Take 10 mg by mouth daily as needed for allergies.     . fish oil-omega-3 fatty acids 1000 MG capsule Take 1 g by mouth daily.     Marland Kitchen glipiZIDE (GLUCOTROL) 5 MG tablet TAKE ONE TABLET BY MOUTH TWICE DAILY BEFORE MEAL(S) 180 tablet 3  . hydroxyurea (HYDREA) 500 MG capsule Take 1 capsule (500 mg total) by mouth daily. 90 capsule 3  . liraglutide 18 MG/3ML SOPN Inject 0.3 mLs (1.8 mg total) into the skin daily. 9 mL 11  . metFORMIN (GLUCOPHAGE) 1000 MG tablet TAKE ONE TABLET BY MOUTH TWICE DAILY WITH MEALS 180 tablet 3  . Multiple Vitamin (MULTIVITAMIN) tablet Take 1 tablet by mouth daily.      Marland Kitchen omeprazole (PRILOSEC OTC) 20 MG tablet Take 20 mg by mouth daily.     Marland Kitchen RELION PEN NEEDLES 32G X 4 MM MISC USE AS DIRECTED ONCE DAILY 100 each 3  . simvastatin (ZOCOR) 10 MG tablet TAKE 1 TABLET BY MOUTH ONCE DAILY 90 tablet 1   No current facility-administered medications for this visit.     PHYSICAL EXAMINATION: ECOG PERFORMANCE STATUS: 1 - Symptomatic but  completely ambulatory  Vitals:   08/10/17 1311  BP: (!) 135/59  Pulse: 98  Resp: 18  Temp: 98.5 F (36.9 C)  SpO2: 98%   Filed Weights   08/10/17 1311  Weight: 162 lb (73.5 kg)    GENERAL:alert, no distress and comfortable SKIN: Noted well-healed surgical scar on her face.  There is a small black lesion on the left side of her nose suspicious for malignancy EYES: normal, Conjunctiva are pink and non-injected, sclera clear OROPHARYNX:no exudate, no erythema and lips, buccal mucosa, and tongue normal  NECK: supple, thyroid normal size, non-tender, without nodularity LYMPH:  no palpable lymphadenopathy in the cervical, axillary or inguinal LUNGS: clear to auscultation and percussion with normal breathing effort HEART: regular rate & rhythm and no murmurs and no lower extremity edema ABDOMEN:abdomen soft, non-tender and normal bowel sounds Musculoskeletal:no cyanosis of digits and no clubbing  NEURO: alert & oriented x 3 with fluent speech, no focal motor/sensory deficits  LABORATORY DATA:  I have reviewed the data as listed    Component Value Date/Time   NA 139 04/14/2016 0832   NA 136 03/31/2013 1414   K 4.5 04/14/2016 0832   K 4.0 03/31/2013 1414   CL 103  04/14/2016 0832   CO2 28 04/14/2016 0832   CO2 24 03/31/2013 1414   GLUCOSE 88 04/14/2016 0832   GLUCOSE 178 (H) 03/31/2013 1414   BUN 11 04/14/2016 0832   BUN 8.1 03/31/2013 1414   CREATININE 0.72 04/14/2016 0832   CREATININE 0.7 03/31/2013 1414   CALCIUM 10.0 04/14/2016 0832   CALCIUM 10.1 03/31/2013 1414   PROT 7.4 04/14/2016 0832   PROT 7.2 03/31/2013 1414   ALBUMIN 4.3 04/14/2016 0832   ALBUMIN 3.9 03/31/2013 1414   AST 18 04/14/2016 0832   AST 19 03/31/2013 1414   ALT 17 04/14/2016 0832   ALT 15 03/31/2013 1414   ALKPHOS 41 04/14/2016 0832   ALKPHOS 55 03/31/2013 1414   BILITOT 0.4 04/14/2016 0832   BILITOT 0.28 03/31/2013 1414   GFRNONAA 112.68 10/29/2009 1221    No results found for: SPEP,  UPEP  Lab Results  Component Value Date   WBC 6.5 08/10/2017   NEUTROABS 3.1 08/10/2017   HGB 10.6 (L) 08/10/2017   HCT 30.5 (L) 08/10/2017   MCV 102.8 (H) 08/10/2017   PLT 586 (H) 08/10/2017      Chemistry      Component Value Date/Time   NA 139 04/14/2016 0832   NA 136 03/31/2013 1414   K 4.5 04/14/2016 0832   K 4.0 03/31/2013 1414   CL 103 04/14/2016 0832   CO2 28 04/14/2016 0832   CO2 24 03/31/2013 1414   BUN 11 04/14/2016 0832   BUN 8.1 03/31/2013 1414   CREATININE 0.72 04/14/2016 0832   CREATININE 0.7 03/31/2013 1414      Component Value Date/Time   CALCIUM 10.0 04/14/2016 0832   CALCIUM 10.1 03/31/2013 1414   ALKPHOS 41 04/14/2016 0832   ALKPHOS 55 03/31/2013 1414   AST 18 04/14/2016 0832   AST 19 03/31/2013 1414   ALT 17 04/14/2016 0832   ALT 15 03/31/2013 1414   BILITOT 0.4 04/14/2016 0832   BILITOT 0.28 03/31/2013 1414      All questions were answered. The patient knows to call the clinic with any problems, questions or concerns. No barriers to learning was detected.  I spent 15 minutes counseling the patient face to face. The total time spent in the appointment was 20 minutes and more than 50% was on counseling and review of test results  Heath Lark, MD 08/10/2017 2:02 PM

## 2017-08-10 NOTE — Telephone Encounter (Signed)
Gave avs and calendar ° °

## 2017-08-10 NOTE — Assessment & Plan Note (Signed)
She had recent basal cell cancer surgery on her face She will follow closely with dermatologist I suspect this could have caused mild reactive thrombocytosis 

## 2017-08-23 DIAGNOSIS — L570 Actinic keratosis: Secondary | ICD-10-CM | POA: Diagnosis not present

## 2017-08-23 DIAGNOSIS — C44321 Squamous cell carcinoma of skin of nose: Secondary | ICD-10-CM | POA: Diagnosis not present

## 2017-10-12 ENCOUNTER — Encounter: Payer: Medicare Other | Admitting: Internal Medicine

## 2017-10-25 ENCOUNTER — Ambulatory Visit (INDEPENDENT_AMBULATORY_CARE_PROVIDER_SITE_OTHER): Payer: Medicare Other | Admitting: Internal Medicine

## 2017-10-25 ENCOUNTER — Encounter: Payer: Self-pay | Admitting: Internal Medicine

## 2017-10-25 VITALS — BP 122/80 | HR 93 | Temp 98.2°F | Ht 63.75 in | Wt 158.0 lb

## 2017-10-25 DIAGNOSIS — D473 Essential (hemorrhagic) thrombocythemia: Secondary | ICD-10-CM

## 2017-10-25 DIAGNOSIS — K219 Gastro-esophageal reflux disease without esophagitis: Secondary | ICD-10-CM

## 2017-10-25 DIAGNOSIS — I471 Supraventricular tachycardia: Secondary | ICD-10-CM

## 2017-10-25 DIAGNOSIS — I10 Essential (primary) hypertension: Secondary | ICD-10-CM | POA: Diagnosis not present

## 2017-10-25 DIAGNOSIS — E119 Type 2 diabetes mellitus without complications: Secondary | ICD-10-CM

## 2017-10-25 DIAGNOSIS — Z0000 Encounter for general adult medical examination without abnormal findings: Secondary | ICD-10-CM | POA: Diagnosis not present

## 2017-10-25 DIAGNOSIS — Z7189 Other specified counseling: Secondary | ICD-10-CM | POA: Diagnosis not present

## 2017-10-25 LAB — HM DIABETES FOOT EXAM

## 2017-10-25 MED ORDER — DULAGLUTIDE 1.5 MG/0.5ML ~~LOC~~ SOAJ: 1.5000 mg | SUBCUTANEOUS | 11 refills | Status: DC

## 2017-10-25 NOTE — Assessment & Plan Note (Signed)
See social history 

## 2017-10-25 NOTE — Assessment & Plan Note (Signed)
I have personally reviewed the Medicare Annual Wellness questionnaire and have noted 1. The patient's medical and social history 2. Their use of alcohol, tobacco or illicit drugs 3. Their current medications and supplements 4. The patient's functional ability including ADL's, fall risks, home safety risks and hearing or visual             impairment. 5. Diet and physical activities 6. Evidence for depression or mood disorders  The patients weight, height, BMI and visual acuity have been recorded in the chart I have made referrals, counseling and provided education to the patient based review of the above and I have provided the pt with a written personalized care plan for preventive services.  I have provided you with a copy of your personalized plan for preventive services. Please take the time to review along with your updated medication list.  Yearly flu vaccine Discussed shingrix Colon due in 2024 Mammogram due this winter Discussed increasing exercise

## 2017-10-25 NOTE — Patient Instructions (Signed)
Please get your mammogram in December. Consider talking to the pharmacist about the shingrix vaccine.

## 2017-10-25 NOTE — Progress Notes (Signed)
Subjective:    Patient ID: Savannah Evans, female    DOB: [birth year], 67 y.o.   MRN: [id number]  HPI Here for Medicare wellness visit and follow up of chronic health conditions Reviewed form and advanced directives Reviewed other doctors No alcohol or tobacco Vision and hearing are fine Not exercising---discussed. Is walking more--and tries to walk in their pool No falls No depression or anhedonia Looking to retire soon Independent with instrumental ADLs No sig memory problems  Feels great! Interested in changing to the once a week injection Rx Checks sugar every 2 weeks or so Always 150 or so No hypoglycemic reactions No foot sores or numbness  Continues with Dr Alvy Bimler Platelets are up some ---needs repeat Same hydroxyurea dose Mild anemia persists  No palpitations or fast heart No chest pain No SOB No dizziness or syncope  Current Outpatient Medications on File Prior to Visit  Medication Sig Dispense Refill  . Ascorbic Acid (VITAMIN C) 500 MG tablet Take 500 mg by mouth daily.      Marland Kitchen aspirin 81 MG tablet Take 81 mg by mouth daily.      . calcium-vitamin D (OSCAL) 250-125 MG-UNIT per tablet Take 1 tablet by mouth daily.      . cetirizine (ZYRTEC) 10 MG tablet Take 10 mg by mouth daily as needed for allergies.     . fish oil-omega-3 fatty acids 1000 MG capsule Take 1 g by mouth daily.     Marland Kitchen glipiZIDE (GLUCOTROL) 5 MG tablet TAKE ONE TABLET BY MOUTH TWICE DAILY BEFORE MEAL(S) 180 tablet 3  . hydroxyurea (HYDREA) 500 MG capsule Take 1 capsule (500 mg total) by mouth daily. 90 capsule 3  . metFORMIN (GLUCOPHAGE) 1000 MG tablet TAKE ONE TABLET BY MOUTH TWICE DAILY WITH MEALS 180 tablet 3  . Multiple Vitamin (MULTIVITAMIN) tablet Take 1 tablet by mouth daily.      Marland Kitchen omeprazole (PRILOSEC OTC) 20 MG tablet Take 20 mg by mouth daily.     Marland Kitchen RELION PEN NEEDLES 32G X 4 MM MISC USE AS DIRECTED ONCE DAILY 100 each 3  . simvastatin (ZOCOR) 10 MG tablet TAKE 1 TABLET BY MOUTH  ONCE DAILY 90 tablet 1   No current facility-administered medications on file prior to visit.     No Known Allergies  Past Medical History:  Diagnosis Date  . Allergic rhinitis   . Anemia, unspecified 03/03/2013  . Diabetes mellitus type 2, noninsulin dependent ([place])   . Diaphragmatic hernia   . Essential thrombocythemia ([place]) 10/21/2012   JAK2 neg; BCR-ABL neg; bone marrow biopsy on 09/29/12 showed normal cytogenetics.   Marland Kitchen GERD (gastroesophageal reflux disease)   . History of palpitations   . Hyperlipidemia   . Iron deficiency   . Osteoarthritis   . SVT (supraventricular tachycardia) ([facility])     Past Surgical History:  Procedure Laterality Date  . COLONOSCOPY     neg age 51.  Next age 11.   . svt ablation  2000   Dr. Caryl Comes    Family History  Problem Relation Age of Onset  . Heart attack Father   . Uterine cancer Sister   . Diabetes Sister   . Macular degeneration Mother   . Cancer Paternal Aunt        cancer?  . Cancer Paternal Uncle        cancer?  . Colon cancer Neg Hx     Social History   Socioeconomic History  . Marital status: Married  Spouse name: Not on file  . Number of children: 3  . Years of education: Not on file  . Highest education level: Not on file  Occupational History  . Occupation: Automotive engineer area for [employer]  . Financial resource strain: Not on file  . Food insecurity:    Worry: Not on file    Inability: Not on file  . Transportation needs:    Medical: Not on file    Non-medical: Not on file  Tobacco Use  . Smoking status: Never Smoker  . Smokeless tobacco: Never Used  Substance and Sexual Activity  . Alcohol use: Yes    Alcohol/week: 0.6 oz    Types: 1 Glasses of wine per week    Comment: social  . Drug use: No  . Sexual activity: Not on file  Lifestyle  . Physical activity:    Days per week: Not on file    Minutes per session: Not on file  . Stress: Not on file  Relationships  .  Social connections:    Talks on phone: Not on file    Gets together: Not on file    Attends religious service: Not on file    Active member of club or organization: Not on file    Attends meetings of clubs or organizations: Not on file    Relationship status: Not on file  . Intimate partner violence:    Fear of current or ex partner: Not on file    Emotionally abused: Not on file    Physically abused: Not on file    Forced sexual activity: Not on file  Other Topics Concern  . Not on file  Social History Narrative   Has living will   Requests husband as health care POA   Would accept resuscitation attempts   No tube feeds if cognitively unaware   Review of Systems Weight is down slightly Usually sleeps well Wears seat belt Teeth are fine---keeps up No skin issues now-- did have Moh's surgery on nose Bowels are okay---no blood No urinary problems No heartburn or swallowing problems --takes the omeprazole daily      Objective:   Physical Exam  Constitutional: She is oriented to person, place, and time. She appears well-developed. No distress.  HENT:  Mouth/Throat: Oropharynx is clear and moist. No oropharyngeal exudate.  Neck: No thyromegaly present.  Cardiovascular: Normal rate, regular rhythm, normal heart sounds and intact distal pulses. Exam reveals no gallop.  No murmur heard. Respiratory: Effort normal and breath sounds normal. No respiratory distress. She has no wheezes. She has no rales.  GI: Soft. There is no tenderness.  Musculoskeletal: She exhibits no edema or tenderness.  Lymphadenopathy:    She has no cervical adenopathy.  Neurological: She is alert and oriented to person, place, and time.  President--- "Dwaine Deter, [place]" [phone] [id number] Recall 3/3  Normal sensation in feet  Skin: No rash noted. No erythema.  No foot lesions  Psychiatric: She has a normal mood and affect. Her behavior is normal.           Assessment & Plan:

## 2017-10-25 NOTE — Assessment & Plan Note (Signed)
Hopefully still good control Will change to weekly dulaglutide

## 2017-10-25 NOTE — Assessment & Plan Note (Signed)
On hydroxyurea still Will check CBC for Dr Alvy Bimler

## 2017-10-25 NOTE — Assessment & Plan Note (Signed)
Quiet on PPI 

## 2017-10-25 NOTE — Assessment & Plan Note (Signed)
No recurrence of this No change needed

## 2017-10-25 NOTE — Assessment & Plan Note (Signed)
BP Readings from Last 3 Encounters:  10/25/17 122/80  08/10/17 (!) 135/59  04/13/17 134/76   Good control

## 2017-10-25 NOTE — Progress Notes (Signed)
Hearing Screening   Method: Audiometry   125Hz  250Hz  500Hz  1000Hz  2000Hz  3000Hz  4000Hz  6000Hz  8000Hz   Right ear:   40 20 20  20     Left ear:   20 20 20  20     Vision Screening Comments: July 2018

## 2017-10-26 LAB — COMPREHENSIVE METABOLIC PANEL
ALBUMIN: 4.5 g/dL (ref 3.5–5.2)
ALT: 18 U/L (ref 0–35)
AST: 18 U/L (ref 0–37)
Alkaline Phosphatase: 43 U/L (ref 39–117)
BUN: 10 mg/dL (ref 6–23)
CHLORIDE: 100 meq/L (ref 96–112)
CO2: 26 meq/L (ref 19–32)
CREATININE: 0.72 mg/dL (ref 0.40–1.20)
Calcium: 10.1 mg/dL (ref 8.4–10.5)
GFR: 85.6 mL/min (ref >60.00)
Glucose, Bld: 101 mg/dL — ABNORMAL HIGH (ref 70–99)
Potassium: 5 mEq/L (ref 3.5–5.1)
SODIUM: 137 meq/L (ref 135–145)
Total Bilirubin: 0.3 mg/dL (ref 0.2–1.2)
Total Protein: 7.5 g/dL (ref 6.0–8.3)

## 2017-10-26 LAB — LIPID PANEL
CHOL/HDL RATIO: 3
Cholesterol: 154 mg/dL (ref 0–200)
HDL: 58.4 mg/dL (ref >39.00)
LDL CALC: 82 mg/dL (ref 0–99)
NonHDL: 95.1
Triglycerides: 68 mg/dL (ref 0.0–149.0)
VLDL: 13.6 mg/dL (ref 0.0–40.0)

## 2017-10-26 LAB — T4, FREE: Free T4: 1 ng/dL (ref 0.60–1.60)

## 2017-10-26 LAB — HEMOGLOBIN A1C: HEMOGLOBIN A1C: 7.8 % — AB (ref 4.6–6.5)

## 2017-10-26 LAB — CBC
HCT: 34.4 % — ABNORMAL LOW (ref 36.0–46.0)
Hemoglobin: 11.8 g/dL — ABNORMAL LOW (ref 12.0–15.0)
MCHC: 34.3 g/dL (ref 30.0–36.0)
MCV: 103.6 fl — ABNORMAL HIGH (ref 78.0–100.0)
Platelets: 631 10*3/uL — ABNORMAL HIGH (ref 150.0–400.0)
RBC: 3.32 Mil/uL — ABNORMAL LOW (ref 3.87–5.11)
RDW: 12.6 % (ref 11.5–15.5)
WBC: 7.3 10*3/uL (ref 4.0–10.5)

## 2017-10-26 LAB — MICROALBUMIN / CREATININE URINE RATIO
Creatinine,U: 51.1 mg/dL
MICROALB/CREAT RATIO: 1.4 mg/g (ref 0.0–30.0)
Microalb, Ur: <0.7 mg/dL (ref 0.0–1.9)

## 2017-10-29 ENCOUNTER — Telehealth: Payer: Self-pay | Admitting: *Deleted

## 2017-10-29 NOTE — Telephone Encounter (Signed)
-----   Message from Heath Lark, MD sent at 10/29/2017  7:56 AM EDT ----- Regarding: increase Hydrea I tried to call her this morning but she is busy Please instruct her to increase Hydrea to 1000 mg Saturdays and Sundays and keep at 500 mg Mondays to Fridays Schedule labs and see me 15 mins appt in 1 month  Thanks

## 2017-10-29 NOTE — Telephone Encounter (Signed)
LM with note below. Requested call back to confirm message

## 2017-10-30 NOTE — Telephone Encounter (Signed)
Called patient with instructions.

## 2017-11-09 ENCOUNTER — Other Ambulatory Visit: Payer: Self-pay | Admitting: Internal Medicine

## 2017-11-30 ENCOUNTER — Other Ambulatory Visit: Payer: Self-pay | Admitting: Internal Medicine

## 2017-12-13 DIAGNOSIS — C44311 Basal cell carcinoma of skin of nose: Secondary | ICD-10-CM | POA: Diagnosis not present

## 2017-12-29 ENCOUNTER — Other Ambulatory Visit: Payer: Self-pay | Admitting: Internal Medicine

## 2018-01-07 LAB — HM DIABETES EYE EXAM

## 2018-01-11 ENCOUNTER — Encounter: Payer: Self-pay | Admitting: Internal Medicine

## 2018-01-12 ENCOUNTER — Other Ambulatory Visit: Payer: Self-pay | Admitting: Hematology and Oncology

## 2018-02-07 ENCOUNTER — Other Ambulatory Visit: Payer: Self-pay | Admitting: Hematology and Oncology

## 2018-02-07 DIAGNOSIS — D473 Essential (hemorrhagic) thrombocythemia: Secondary | ICD-10-CM

## 2018-02-11 ENCOUNTER — Encounter: Payer: Self-pay | Admitting: Hematology and Oncology

## 2018-02-11 ENCOUNTER — Telehealth: Payer: Self-pay | Admitting: Hematology and Oncology

## 2018-02-11 ENCOUNTER — Inpatient Hospital Stay: Payer: Medicare Other | Attending: Hematology and Oncology | Admitting: Hematology and Oncology

## 2018-02-11 ENCOUNTER — Inpatient Hospital Stay: Payer: Medicare Other

## 2018-02-11 VITALS — BP 139/65 | HR 82 | Temp 98.1°F | Resp 18 | Ht 63.0 in | Wt 163.0 lb

## 2018-02-11 DIAGNOSIS — D473 Essential (hemorrhagic) thrombocythemia: Secondary | ICD-10-CM | POA: Diagnosis not present

## 2018-02-11 DIAGNOSIS — Z85828 Personal history of other malignant neoplasm of skin: Secondary | ICD-10-CM

## 2018-02-11 DIAGNOSIS — Z299 Encounter for prophylactic measures, unspecified: Secondary | ICD-10-CM | POA: Insufficient documentation

## 2018-02-11 DIAGNOSIS — Z23 Encounter for immunization: Secondary | ICD-10-CM | POA: Diagnosis not present

## 2018-02-11 DIAGNOSIS — D649 Anemia, unspecified: Secondary | ICD-10-CM

## 2018-02-11 DIAGNOSIS — E119 Type 2 diabetes mellitus without complications: Secondary | ICD-10-CM | POA: Diagnosis not present

## 2018-02-11 DIAGNOSIS — Z7982 Long term (current) use of aspirin: Secondary | ICD-10-CM

## 2018-02-11 DIAGNOSIS — Z9889 Other specified postprocedural states: Secondary | ICD-10-CM

## 2018-02-11 LAB — CBC WITH DIFFERENTIAL/PLATELET
BASOS ABS: 0 10*3/uL (ref 0.0–0.1)
Basophils Relative: 1 %
Eosinophils Absolute: 0.1 10*3/uL (ref 0.0–0.5)
Eosinophils Relative: 3 %
HEMATOCRIT: 32.5 % — AB (ref 34.8–46.6)
Hemoglobin: 11.1 g/dL — ABNORMAL LOW (ref 11.6–15.9)
LYMPHS PCT: 41 %
Lymphs Abs: 2.4 10*3/uL (ref 0.9–3.3)
MCH: 35.7 pg — ABNORMAL HIGH (ref 25.1–34.0)
MCHC: 34.3 g/dL (ref 31.5–36.0)
MCV: 104.3 fL — AB (ref 79.5–101.0)
Monocytes Absolute: 0.6 10*3/uL (ref 0.1–0.9)
Monocytes Relative: 10 %
Neutro Abs: 2.7 10*3/uL (ref 1.5–6.5)
Neutrophils Relative %: 45 %
PLATELETS: 493 10*3/uL — AB (ref 145–400)
RBC: 3.11 MIL/uL — AB (ref 3.70–5.45)
RDW: 13 % (ref 11.2–14.5)
WBC: 5.9 10*3/uL (ref 3.9–10.3)

## 2018-02-11 MED ORDER — HYDROXYUREA 500 MG PO CAPS: ORAL_CAPSULE | ORAL | 3 refills | Status: DC

## 2018-02-11 MED ORDER — INFLUENZA VAC SPLIT HIGH-DOSE 0.5 ML IM SUSY
0.5000 mL | PREFILLED_SYRINGE | Freq: Once | INTRAMUSCULAR | Status: AC
  Administered 2018-02-11: 0.5 mL via INTRAMUSCULAR
  Filled 2018-02-11: qty 0.5

## 2018-02-11 NOTE — Progress Notes (Signed)
[place] OFFICE PROGRESS NOTE  Patient Care Team: [name], MD as PCP - General Barbaraann Rondo, MD as Attending Physician (Obstetrics and Gynecology) Heath Lark, MD as Consulting Physician (Hematology and Oncology)  ASSESSMENT & PLAN:  Essential thrombocythemia She is doing well. Even though her platelet count is suboptimally controlled, the patient is not symptomatic and has never been diagnosed with blood clot. There is no concurrent leukocytosis. She has evidence of anemia and I am reluctant to increase the dose of hydroxyurea further. I recommend she continues taking hydroxyurea 1 tablet daily except on Saturdays and Sundays she takes 2 tablets I will see her back in 6 months for further follow-up.  Preventive measure We discussed the importance of preventive care and reviewed the vaccination programs. She does not have any prior allergic reactions to influenza vaccination. She agrees to proceed with influenza vaccination today and we will administer it today at the clinic.   Controlled type 2 diabetes mellitus without complication, without long-term current use of insulin [name] She has poorly controlled diabetes with recent weight gain. We discussed dietary changes and lifestyle modification.  She will continue her medication as directed by her primary care doctor.  History of basal cell carcinoma (BCC) excision She had recent basal cell cancer surgery on her face She will follow closely with dermatologist I suspect this could have caused mild reactive thrombocytosis   No orders of the defined types were placed in this encounter.   INTERVAL HISTORY: Please see below for problem oriented charting. She returns for follow-up for essential thrombocytosis She tolerated recent treatment well Denies recent infection, fever or chills She had basal cell carcinoma excision from her face She denies recent excessive bleeding She has not received influenza  vaccination. She has gained some weight and admits to poor adherence to strict low carbohydrate diet.  SUMMARY OF ONCOLOGIC HISTORY: This patient used to be a blood donor. She was refused blood donation for several years due to anemia. A review of her CBC dated back to 2012 in note of mild anemia with associated thrombocytosis. In May of this year, she underwent extensive evaluation. Iron studies show that she had borderline iron deficiency. In June 2014, she had a bone marrow aspirate and biopsy. Results are inconclusive but there are presence of atypical megakaryocytes. Cytogenetics a study was normal. The patient was started on hydroxyurea. MPL mutation, BCR/ABL and JAK2 mutation testing was negative In December 2014, she had EGD and colonoscopy for evaluation of iron deficiency anemia and the tests were negative apart from mild nonspecific gastritis. She was placed on iron supplements, subsequently discontinued in January 2015 On 01/08/2014, dose of hydroxyurea was modified to. She is instructed to take 6 days a week and skip on Sundays. Since September 2015, she reduce hydroxyurea by skipping doses on Wednesday and Sundays On 05/04/2014, I reduce hydroxyurea dose further to 500 mg 4 days a week and to add anagrelide. Due to difficulties obtaining the pills, she had not been able to take hydroxyurea and anagrelide. On 06/04/14: dose of hydroxyurea is increased to 500 mg daily. In 2019, the dose of Hydrea was increased to 500 mg daily except on Saturdays and Sundays she take 1000 mg  REVIEW OF SYSTEMS:   Constitutional: Denies fevers, chills or abnormal weight loss Eyes: Denies blurriness of vision Ears, nose, mouth, throat, and face: Denies mucositis or sore throat Respiratory: Denies cough, dyspnea or wheezes Cardiovascular: Denies palpitation, chest discomfort or lower extremity swelling Gastrointestinal:  Denies nausea, heartburn or change in bowel habits Skin: Denies abnormal skin  rashes Lymphatics: Denies new lymphadenopathy or easy bruising Neurological:Denies numbness, tingling or new weaknesses Behavioral/Psych: Mood is stable, no new changes  All other systems were reviewed with the patient and are negative.  I have reviewed the past medical history, past surgical history, social history and family history with the patient and they are unchanged from previous note.  ALLERGIES:  has No Known Allergies.  MEDICATIONS:  Current Outpatient Medications  Medication Sig Dispense Refill  . Ascorbic Acid (VITAMIN C) 500 MG tablet Take 500 mg by mouth daily.      Marland Kitchen aspirin 81 MG tablet Take 81 mg by mouth daily.      . calcium-vitamin D (OSCAL) 250-125 MG-UNIT per tablet Take 1 tablet by mouth daily.      . cetirizine (ZYRTEC) 10 MG tablet Take 10 mg by mouth daily as needed for allergies.     . Dulaglutide 1.5 MG/0.5ML SOPN Inject 1.5 mg into the skin once a week. 5 pen 11  . fish oil-omega-3 fatty acids 1000 MG capsule Take 1 g by mouth daily.     Marland Kitchen glipiZIDE (GLUCOTROL) 5 MG tablet TAKE 1 TABLET BY MOUTH TWICE DAILY BEFORE MEALS 180 tablet 3  . hydroxyurea (HYDREA) 500 MG capsule Take 500 mg daily except on Saturdays and Sundays, take 1000 mg by mouth 120 capsule 3  . metFORMIN (GLUCOPHAGE) 1000 MG tablet TAKE 1 TABLET BY MOUTH TWICE DAILY WITH MEALS 180 tablet 3  . Multiple Vitamin (MULTIVITAMIN) tablet Take 1 tablet by mouth daily.      Marland Kitchen omeprazole (PRILOSEC OTC) 20 MG tablet Take 20 mg by mouth daily.     Marland Kitchen RELION PEN NEEDLES 32G X 4 MM MISC USE AS DIRECTED ONCE DAILY 100 each 3  . simvastatin (ZOCOR) 10 MG tablet TAKE 1 TABLET BY MOUTH ONCE DAILY 90 tablet 3   No current facility-administered medications for this visit.     PHYSICAL EXAMINATION: ECOG PERFORMANCE STATUS: 1 - Symptomatic but completely ambulatory  Vitals:   02/11/18 1340  BP: 139/65  Pulse: 82  Resp: 18  Temp: 98.1 F (36.7 C)  SpO2: 100%   Filed Weights   02/11/18 1340  Weight: 163  lb (73.9 kg)    GENERAL:alert, no distress and comfortable SKIN: skin color, texture, turgor are normal, no rashes or significant lesions NEURO: alert & oriented x 3 with fluent speech, no focal motor/sensory deficits  LABORATORY DATA:  I have reviewed the data as listed    Component Value Date/Time   NA 137 10/25/2017 1553   NA 136 03/31/2013 1414   K 5.0 10/25/2017 1553   K 4.0 03/31/2013 1414   CL 100 10/25/2017 1553   CO2 26 10/25/2017 1553   CO2 24 03/31/2013 1414   GLUCOSE 101 (H) 10/25/2017 1553   GLUCOSE 178 (H) 03/31/2013 1414   BUN 10 10/25/2017 1553   BUN 8.1 03/31/2013 1414   CREATININE 0.72 10/25/2017 1553   CREATININE 0.7 03/31/2013 1414   CALCIUM 10.1 10/25/2017 1553   CALCIUM 10.1 03/31/2013 1414   PROT 7.5 10/25/2017 1553   PROT 7.2 03/31/2013 1414   ALBUMIN 4.5 10/25/2017 1553   ALBUMIN 3.9 03/31/2013 1414   AST 18 10/25/2017 1553   AST 19 03/31/2013 1414   ALT 18 10/25/2017 1553   ALT 15 03/31/2013 1414   ALKPHOS 43 10/25/2017 1553   ALKPHOS 55 03/31/2013 1414   BILITOT 0.3 10/25/2017  1553   BILITOT 0.28 03/31/2013 1414   GFRNONAA 112.68 10/29/2009 1221    No results found for: SPEP, UPEP  Lab Results  Component Value Date   WBC 5.9 02/11/2018   NEUTROABS 2.7 02/11/2018   HGB 11.1 (L) 02/11/2018   HCT 32.5 (L) 02/11/2018   MCV 104.3 (H) 02/11/2018   PLT 493 (H) 02/11/2018      Chemistry      Component Value Date/Time   NA 137 10/25/2017 1553   NA 136 03/31/2013 1414   K 5.0 10/25/2017 1553   K 4.0 03/31/2013 1414   CL 100 10/25/2017 1553   CO2 26 10/25/2017 1553   CO2 24 03/31/2013 1414   BUN 10 10/25/2017 1553   BUN 8.1 03/31/2013 1414   CREATININE 0.72 10/25/2017 1553   CREATININE 0.7 03/31/2013 1414      Component Value Date/Time   CALCIUM 10.1 10/25/2017 1553   CALCIUM 10.1 03/31/2013 1414   ALKPHOS 43 10/25/2017 1553   ALKPHOS 55 03/31/2013 1414   AST 18 10/25/2017 1553   AST 19 03/31/2013 1414   ALT 18 10/25/2017 1553    ALT 15 03/31/2013 1414   BILITOT 0.3 10/25/2017 1553   BILITOT 0.28 03/31/2013 1414      All questions were answered. The patient knows to call the clinic with any problems, questions or concerns. No barriers to learning was detected.  I spent 15 minutes counseling the patient face to face. The total time spent in the appointment was 20 minutes and more than 50% was on counseling and review of test results  Heath Lark, MD 02/11/2018 4:23 PM

## 2018-02-11 NOTE — Assessment & Plan Note (Signed)
She is doing well. Even though her platelet count is suboptimally controlled, the patient is not symptomatic and has never been diagnosed with blood clot. There is no concurrent leukocytosis. She has evidence of anemia and I am reluctant to increase the dose of hydroxyurea further. I recommend she continues taking hydroxyurea 1 tablet daily except on Saturdays and Sundays she takes 2 tablets I will see her back in 6 months for further follow-up.

## 2018-02-11 NOTE — Assessment & Plan Note (Signed)
She has poorly controlled diabetes with recent weight gain. We discussed dietary changes and lifestyle modification.  She will continue her medication as directed by her primary care doctor.

## 2018-02-11 NOTE — Telephone Encounter (Signed)
Gave patient avs and calendar.   °

## 2018-02-11 NOTE — Assessment & Plan Note (Signed)
We discussed the importance of preventive care and reviewed the vaccination programs. She does not have any prior allergic reactions to influenza vaccination. She agrees to proceed with influenza vaccination today and we will administer it today at the clinic.  

## 2018-02-11 NOTE — Assessment & Plan Note (Signed)
She had recent basal cell cancer surgery on her face She will follow closely with dermatologist I suspect this could have caused mild reactive thrombocytosis

## 2018-04-26 ENCOUNTER — Ambulatory Visit (INDEPENDENT_AMBULATORY_CARE_PROVIDER_SITE_OTHER): Payer: Medicare Other | Admitting: Internal Medicine

## 2018-04-26 ENCOUNTER — Encounter: Payer: Self-pay | Admitting: Internal Medicine

## 2018-04-26 VITALS — BP 124/84 | HR 76 | Temp 98.1°F | Ht 63.0 in | Wt 161.0 lb

## 2018-04-26 DIAGNOSIS — Z1231 Encounter for screening mammogram for malignant neoplasm of breast: Secondary | ICD-10-CM | POA: Diagnosis not present

## 2018-04-26 DIAGNOSIS — C44311 Basal cell carcinoma of skin of nose: Secondary | ICD-10-CM | POA: Diagnosis not present

## 2018-04-26 DIAGNOSIS — E119 Type 2 diabetes mellitus without complications: Secondary | ICD-10-CM | POA: Diagnosis not present

## 2018-04-26 DIAGNOSIS — Z1239 Encounter for other screening for malignant neoplasm of breast: Secondary | ICD-10-CM | POA: Diagnosis not present

## 2018-04-26 LAB — POCT GLYCOSYLATED HEMOGLOBIN (HGB A1C): HEMOGLOBIN A1C: 7.6 % — AB (ref 4.0–5.6)

## 2018-04-26 NOTE — Progress Notes (Signed)
Subjective:    Patient ID: Savannah Evans, female    DOB: [birth year], 67 y.o.   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 Ascorbic Acid (VITAMIN C) 500 MG tablet Take 500 mg by mouth daily.      Marland Kitchen aspirin 81 MG tablet Take 81 mg by mouth daily.      . calcium-vitamin D (OSCAL) 250-125 MG-UNIT per tablet Take 1 tablet by mouth daily.      . cetirizine (ZYRTEC) 10 MG tablet Take 10 mg by mouth daily as needed for allergies.     . Dulaglutide 1.5 MG/0.5ML SOPN Inject 1.5 mg into the skin once a week. 5 pen 11  . fish oil-omega-3 fatty acids 1000 MG capsule Take 1 g by mouth daily.     Marland Kitchen glipiZIDE (GLUCOTROL) 5 MG tablet TAKE 1 TABLET BY MOUTH TWICE DAILY BEFORE MEALS 180 tablet 3  . hydroxyurea (HYDREA) 500 MG capsule Take 500 mg daily except on Saturdays and Sundays, take 1000 mg by mouth 120 capsule 3  . metFORMIN (GLUCOPHAGE) 1000 MG tablet TAKE 1 TABLET BY MOUTH TWICE DAILY WITH MEALS 180 tablet 3  . Multiple Vitamin (MULTIVITAMIN) tablet Take 1 tablet by mouth daily.      Marland Kitchen omeprazole (PRILOSEC OTC) 20 MG tablet Take 20 mg by mouth daily.     . simvastatin (ZOCOR) 10 MG tablet TAKE 1 TABLET BY MOUTH ONCE DAILY 90 tablet 3   No current facility-administered medications on file prior to visit.     No Known Allergies  Past Medical History:  Diagnosis Date  . Allergic rhinitis   . Anemia, unspecified 03/03/2013  . Diabetes mellitus type 2, noninsulin dependent ([place])   . Diaphragmatic hernia   . Essential thrombocythemia ([place]) 10/21/2012   JAK2 neg; BCR-ABL neg; bone  marrow biopsy on 09/29/12 showed normal cytogenetics.   Marland Kitchen GERD (gastroesophageal reflux disease)   . History of palpitations   . Hyperlipidemia   . Iron deficiency   . Osteoarthritis   . SVT (supraventricular tachycardia) ([facility])     Past Surgical History:  Procedure Laterality Date  . COLONOSCOPY     neg age 54.  Next age 38.   . svt ablation  2000   Dr. Caryl Comes    Family History  Problem Relation Age of Onset  . Heart attack Father   . Uterine cancer Sister   . Diabetes Sister   . Macular degeneration Mother   . Cancer Paternal Aunt        cancer?  . Cancer Paternal Uncle        cancer?  . Colon cancer Neg Hx     Social History   Socioeconomic History  . Marital status: Married    Spouse name: Not on file  . Number of children: 3  . Years of education: Not on file  . Highest education level: Not on file  Occupational History  . Occupation: Automotive engineer area for [employer]  . Financial resource strain: Not on file  . Food insecurity:    Worry:  Not on file    Inability: Not on file  . Transportation needs:    Medical: Not on file    Non-medical: Not on file  Tobacco Use  . Smoking status: Never Smoker  . Smokeless tobacco: Never Used  Substance and Sexual Activity  . Alcohol use: Yes    Alcohol/week: 1.0 standard drinks    Types: 1 Glasses of wine per week    Comment: social  . Drug use: No  . Sexual activity: Not on file  Lifestyle  . Physical activity:    Days per week: Not on file    Minutes per session: Not on file  . Stress: Not on file  Relationships  . Social connections:    Talks on phone: Not on file    Gets together: Not on file    Attends religious service: Not on file    Active member of club or organization: Not on file    Attends meetings of clubs or organizations: Not on file    Relationship status: Not on file  . Intimate partner violence:    Fear of current or ex partner: Not on file    Emotionally  abused: Not on file    Physically abused: Not on file    Forced sexual activity: Not on file  Other Topics Concern  . Not on file  Social History Narrative   Has living will   Requests husband as health care POA   Would accept resuscitation attempts   No tube feeds if cognitively unaware   Review of Systems  Husband doing better---back in sinus (and much better since starting CPAP) She is sleeping better since he started the machine Appetite is fine Weight is stable     Objective:   Physical Exam  Constitutional: She appears well-developed. No distress.  Neck: No thyromegaly present.  Cardiovascular: Normal rate, regular rhythm, normal heart sounds and intact distal pulses. Exam reveals no gallop.  No murmur heard. Respiratory: Effort normal and breath sounds normal. No respiratory distress. She has no wheezes. She has no rales.  Musculoskeletal:        General: No edema.  Lymphadenopathy:    She has no cervical adenopathy.  Skin:  No foot lesions  Psychiatric: She has a normal mood and affect. Her behavior is normal.           Assessment & Plan:

## 2018-04-26 NOTE — Assessment & Plan Note (Signed)
Lab Results  Component Value Date   HGBA1C 7.6 (A) 04/26/2018   Good control No med changes Plans to increase exercise after retiring

## 2018-05-27 ENCOUNTER — Other Ambulatory Visit: Payer: Self-pay | Admitting: *Deleted

## 2018-05-27 MED ORDER — DULAGLUTIDE 1.5 MG/0.5ML ~~LOC~~ SOAJ: 1.5000 mg | SUBCUTANEOUS | 5 refills | Status: DC

## 2018-05-27 MED ORDER — GLIPIZIDE 5 MG PO TABS: 5.0000 mg | ORAL_TABLET | Freq: Two times a day (BID) | ORAL | 1 refills | Status: DC

## 2018-05-27 NOTE — Telephone Encounter (Signed)
Spoke to pt who states their insurance has changed and they are no longer using [facility], but [facility] instead. Requested medications sent and previous pharmacy removed.

## 2018-05-30 ENCOUNTER — Ambulatory Visit
Admission: RE | Admit: 2018-05-30 | Discharge: 2018-05-30 | Disposition: A | Discharge status: Home / Self Care | Payer: Medicare Other | Source: Ambulatory Visit | Attending: Internal Medicine | Admitting: Internal Medicine

## 2018-05-30 DIAGNOSIS — Z1231 Encounter for screening mammogram for malignant neoplasm of breast: Secondary | ICD-10-CM | POA: Diagnosis not present

## 2018-05-30 DIAGNOSIS — Z1239 Encounter for other screening for malignant neoplasm of breast: Secondary | ICD-10-CM | POA: Diagnosis present

## 2018-06-07 ENCOUNTER — Telehealth: Payer: Self-pay | Admitting: Internal Medicine

## 2018-06-07 NOTE — Telephone Encounter (Signed)
I left a message on patient's voice mail to call back and r/s her appointment on 11/01/18. If patient calls, please reschedule appointment. Dr.Letvak has a hold on 11/08/18 and 11/11/18 that can be used for rescheduling.

## 2018-06-10 NOTE — Telephone Encounter (Signed)
Patient r/s appointment to 12/31/18.

## 2018-08-16 ENCOUNTER — Ambulatory Visit: Payer: Medicare Other | Admitting: Hematology and Oncology

## 2018-08-16 ENCOUNTER — Other Ambulatory Visit: Payer: Self-pay

## 2018-08-16 ENCOUNTER — Inpatient Hospital Stay: Payer: Medicare Other | Attending: Hematology and Oncology

## 2018-08-16 ENCOUNTER — Encounter: Payer: Self-pay | Admitting: Hematology and Oncology

## 2018-08-16 ENCOUNTER — Other Ambulatory Visit: Payer: Medicare Other

## 2018-08-16 ENCOUNTER — Telehealth: Payer: Self-pay | Admitting: Hematology and Oncology

## 2018-08-16 ENCOUNTER — Inpatient Hospital Stay (HOSPITAL_BASED_OUTPATIENT_CLINIC_OR_DEPARTMENT_OTHER): Payer: Medicare Other | Admitting: Hematology and Oncology

## 2018-08-16 DIAGNOSIS — Z794 Long term (current) use of insulin: Secondary | ICD-10-CM | POA: Diagnosis not present

## 2018-08-16 DIAGNOSIS — E119 Type 2 diabetes mellitus without complications: Secondary | ICD-10-CM | POA: Diagnosis not present

## 2018-08-16 DIAGNOSIS — D473 Essential (hemorrhagic) thrombocythemia: Secondary | ICD-10-CM | POA: Insufficient documentation

## 2018-08-16 DIAGNOSIS — D630 Anemia in neoplastic disease: Secondary | ICD-10-CM | POA: Insufficient documentation

## 2018-08-16 DIAGNOSIS — Z79899 Other long term (current) drug therapy: Secondary | ICD-10-CM | POA: Diagnosis not present

## 2018-08-16 LAB — CBC WITH DIFFERENTIAL/PLATELET
Abs Immature Granulocytes: 0.01 10*3/uL (ref 0.00–0.07)
Basophils Absolute: 0 10*3/uL (ref 0.0–0.1)
Basophils Relative: 1 %
Eosinophils Absolute: 0.1 10*3/uL (ref 0.0–0.5)
Eosinophils Relative: 1 %
HCT: 33 % — ABNORMAL LOW (ref 36.0–46.0)
Hemoglobin: 11.5 g/dL — ABNORMAL LOW (ref 12.0–15.0)
Immature Granulocytes: 0 %
Lymphocytes Relative: 43 %
Lymphs Abs: 2.4 10*3/uL (ref 0.7–4.0)
MCH: 36.2 pg — ABNORMAL HIGH (ref 26.0–34.0)
MCHC: 34.8 g/dL (ref 30.0–36.0)
MCV: 103.8 fL — ABNORMAL HIGH (ref 80.0–100.0)
Monocytes Absolute: 0.4 10*3/uL (ref 0.1–1.0)
Monocytes Relative: 8 %
Neutro Abs: 2.7 10*3/uL (ref 1.7–7.7)
Neutrophils Relative %: 47 %
Platelets: 502 10*3/uL — ABNORMAL HIGH (ref 150–400)
RBC: 3.18 MIL/uL — ABNORMAL LOW (ref 3.87–5.11)
RDW: 12.3 % (ref 11.5–15.5)
WBC: 5.6 10*3/uL (ref 4.0–10.5)
nRBC: 0 % (ref 0.0–0.2)

## 2018-08-16 NOTE — Telephone Encounter (Signed)
Scheduled per sch msg. Called patient. No answer, left msg. Will mail printout.  °

## 2018-08-16 NOTE — Assessment & Plan Note (Signed)
She is doing well. Even though her platelet count is suboptimally controlled, the patient is not symptomatic and has never been diagnosed with blood clot. There is no concurrent leukocytosis. She has evidence of anemia and I am reluctant to increase the dose of hydroxyurea further. I recommend she continues taking hydroxyurea 1 tablet daily except on Saturdays and Sundays she takes 2 tablets I will see her back in 3 months for further follow-up.

## 2018-08-16 NOTE — Telephone Encounter (Signed)
Per 4/3 los - Return for scheduling msg sent.

## 2018-08-16 NOTE — Assessment & Plan Note (Signed)
This is likely due to recent treatment and poorly controlled DM. The patient denies recent history of bleeding such as epistaxis, hematuria or hematochezia. She is asymptomatic from the anemia. I will observe for now.  

## 2018-08-16 NOTE — Assessment & Plan Note (Signed)
She has poorly controlled diabetes which I suspect contributed to the anemia We discussed the importance of dietary modification and lifestyle changes.

## 2018-08-16 NOTE — Progress Notes (Signed)
[place] OFFICE PROGRESS NOTE  Patient Care Team: [name], MD as PCP - General Barbaraann Rondo, MD as Attending Physician (Obstetrics and Gynecology) Heath Lark, MD as Consulting Physician (Hematology and Oncology)  ASSESSMENT & PLAN:  Essential thrombocythemia She is doing well. Even though her platelet count is suboptimally controlled, the patient is not symptomatic and has never been diagnosed with blood clot. There is no concurrent leukocytosis. She has evidence of anemia and I am reluctant to increase the dose of hydroxyurea further. I recommend she continues taking hydroxyurea 1 tablet daily except on Saturdays and Sundays she takes 2 tablets I will see her back in 3 months for further follow-up.  Anemia in neoplastic disease This is likely due to recent treatment and poorly controlled DM. The patient denies recent history of bleeding such as epistaxis, hematuria or hematochezia. She is asymptomatic from the anemia. I will observe for now.   Controlled type 2 diabetes mellitus without complication, without long-term current use of insulin ([place]) She has poorly controlled diabetes which I suspect contributed to the anemia We discussed the importance of dietary modification and lifestyle changes.   No orders of the defined types were placed in this encounter.   INTERVAL HISTORY: Please see below for problem oriented charting. She returns for further follow-up She denies recent infection, fever or chills She is compliant taking hydroxyurea as directed Her recent hemoglobin A1c was over 7%.  The patient is currently leading a fairly sedentary lifestyle due to working from home. She is not making good food choices, full of starches on a regular basis. The patient denies any recent signs or symptoms of bleeding such as spontaneous epistaxis, hematuria or hematochezia.   SUMMARY OF ONCOLOGIC HISTORY: This patient used to be a blood donor. She was  refused blood donation for several years due to anemia. A review of her CBC dated back to 2012 in note of mild anemia with associated thrombocytosis. In May of this year, she underwent extensive evaluation. Iron studies show that she had borderline iron deficiency. In June 2014, she had a bone marrow aspirate and biopsy. Results are inconclusive but there are presence of atypical megakaryocytes. Cytogenetics a study was normal. The patient was started on hydroxyurea. MPL mutation, BCR/ABL and JAK2 mutation testing was negative In December 2014, she had EGD and colonoscopy for evaluation of iron deficiency anemia and the tests were negative apart from mild nonspecific gastritis. She was placed on iron supplements, subsequently discontinued in January 2015 On 01/08/2014, dose of hydroxyurea was modified to. She is instructed to take 6 days a week and skip on Sundays. Since September 2015, she reduce hydroxyurea by skipping doses on Wednesday and Sundays On 05/04/2014, I reduce hydroxyurea dose further to 500 mg 4 days a week and to add anagrelide. Due to difficulties obtaining the pills, she had not been able to take hydroxyurea and anagrelide. On 06/04/14: dose of hydroxyurea is increased to 500 mg daily. In 2019, the dose of Hydrea was increased to 500 mg daily except on Saturdays and Sundays she take 1000 mg   REVIEW OF SYSTEMS:   Constitutional: Denies fevers, chills or abnormal weight loss Eyes: Denies blurriness of vision Ears, nose, mouth, throat, and face: Denies mucositis or sore throat Respiratory: Denies cough, dyspnea or wheezes Cardiovascular: Denies palpitation, chest discomfort or lower extremity swelling Gastrointestinal:  Denies nausea, heartburn or change in bowel habits Skin: Denies abnormal skin rashes Lymphatics: Denies new lymphadenopathy or easy bruising Neurological:Denies  numbness, tingling or new weaknesses Behavioral/Psych: Mood is stable, no new changes  All other  systems were reviewed with the patient and are negative.  I have reviewed the past medical history, past surgical history, social history and family history with the patient and they are unchanged from previous note.  ALLERGIES:  has No Known Allergies.  MEDICATIONS:  Current Outpatient Medications  Medication Sig Dispense Refill  . Ascorbic Acid (VITAMIN C) 500 MG tablet Take 500 mg by mouth daily.      Marland Kitchen aspirin 81 MG tablet Take 81 mg by mouth daily.      . calcium-vitamin D (OSCAL) 250-125 MG-UNIT per tablet Take 1 tablet by mouth daily.      . cetirizine (ZYRTEC) 10 MG tablet Take 10 mg by mouth daily as needed for allergies.     . Dulaglutide 1.5 MG/0.5ML SOPN Inject 1.5 mg into the skin once a week. 5 pen 5  . fish oil-omega-3 fatty acids 1000 MG capsule Take 1 g by mouth daily.     Marland Kitchen glipiZIDE (GLUCOTROL) 5 MG tablet Take 1 tablet (5 mg total) by mouth 2 (two) times daily before a meal. 180 tablet 1  . hydroxyurea (HYDREA) 500 MG capsule Take 500 mg daily except on Saturdays and Sundays, take 1000 mg by mouth 120 capsule 3  . metFORMIN (GLUCOPHAGE) 1000 MG tablet TAKE 1 TABLET BY MOUTH TWICE DAILY WITH MEALS 180 tablet 3  . Multiple Vitamin (MULTIVITAMIN) tablet Take 1 tablet by mouth daily.      Marland Kitchen omeprazole (PRILOSEC OTC) 20 MG tablet Take 20 mg by mouth daily.     . simvastatin (ZOCOR) 10 MG tablet TAKE 1 TABLET BY MOUTH ONCE DAILY 90 tablet 3   No current facility-administered medications for this visit.     PHYSICAL EXAMINATION: ECOG PERFORMANCE STATUS: 0 - Asymptomatic  Vitals:   08/16/18 0806  BP: (!) 143/70  Pulse: 90  Resp: 18  Temp: 98.7 F (37.1 C)  SpO2: 100%   Filed Weights   08/16/18 0806  Weight: 161 lb 1.6 oz (73.1 kg)    GENERAL:alert, no distress and comfortable NEURO: alert & oriented x 3 with fluent speech, no focal motor/sensory deficits  LABORATORY DATA:  I have reviewed the data as listed    Component Value Date/Time   NA 137 10/25/2017  1553   NA 136 03/31/2013 1414   K 5.0 10/25/2017 1553   K 4.0 03/31/2013 1414   CL 100 10/25/2017 1553   CO2 26 10/25/2017 1553   CO2 24 03/31/2013 1414   GLUCOSE 101 (H) 10/25/2017 1553   GLUCOSE 178 (H) 03/31/2013 1414   BUN 10 10/25/2017 1553   BUN 8.1 03/31/2013 1414   CREATININE 0.72 10/25/2017 1553   CREATININE 0.7 03/31/2013 1414   CALCIUM 10.1 10/25/2017 1553   CALCIUM 10.1 03/31/2013 1414   PROT 7.5 10/25/2017 1553   PROT 7.2 03/31/2013 1414   ALBUMIN 4.5 10/25/2017 1553   ALBUMIN 3.9 03/31/2013 1414   AST 18 10/25/2017 1553   AST 19 03/31/2013 1414   ALT 18 10/25/2017 1553   ALT 15 03/31/2013 1414   ALKPHOS 43 10/25/2017 1553   ALKPHOS 55 03/31/2013 1414   BILITOT 0.3 10/25/2017 1553   BILITOT 0.28 03/31/2013 1414   GFRNONAA 112.68 10/29/2009 1221    No results found for: SPEP, UPEP  Lab Results  Component Value Date   WBC 5.6 08/16/2018   NEUTROABS 2.7 08/16/2018   HGB 11.5 (L) 08/16/2018  HCT 33.0 (L) 08/16/2018   MCV 103.8 (H) 08/16/2018   PLT 502 (H) 08/16/2018      Chemistry      Component Value Date/Time   NA 137 10/25/2017 1553   NA 136 03/31/2013 1414   K 5.0 10/25/2017 1553   K 4.0 03/31/2013 1414   CL 100 10/25/2017 1553   CO2 26 10/25/2017 1553   CO2 24 03/31/2013 1414   BUN 10 10/25/2017 1553   BUN 8.1 03/31/2013 1414   CREATININE 0.72 10/25/2017 1553   CREATININE 0.7 03/31/2013 1414      Component Value Date/Time   CALCIUM 10.1 10/25/2017 1553   CALCIUM 10.1 03/31/2013 1414   ALKPHOS 43 10/25/2017 1553   ALKPHOS 55 03/31/2013 1414   AST 18 10/25/2017 1553   AST 19 03/31/2013 1414   ALT 18 10/25/2017 1553   ALT 15 03/31/2013 1414   BILITOT 0.3 10/25/2017 1553   BILITOT 0.28 03/31/2013 1414      All questions were answered. The patient knows to call the clinic with any problems, questions or concerns. No barriers to learning was detected.  I spent 10 minutes counseling the patient face to face. The total time spent in the  appointment was 15 minutes and more than 50% was on counseling and review of test results  Heath Lark, MD 08/16/2018 8:27 AM

## 2018-08-29 DIAGNOSIS — L853 Xerosis cutis: Secondary | ICD-10-CM | POA: Diagnosis not present

## 2018-08-29 DIAGNOSIS — L578 Other skin changes due to chronic exposure to nonionizing radiation: Secondary | ICD-10-CM | POA: Diagnosis not present

## 2018-08-29 DIAGNOSIS — L570 Actinic keratosis: Secondary | ICD-10-CM | POA: Diagnosis not present

## 2018-08-29 DIAGNOSIS — L820 Inflamed seborrheic keratosis: Secondary | ICD-10-CM | POA: Diagnosis not present

## 2018-08-29 DIAGNOSIS — I788 Other diseases of capillaries: Secondary | ICD-10-CM | POA: Diagnosis not present

## 2018-10-24 ENCOUNTER — Telehealth: Payer: Self-pay | Admitting: Hematology and Oncology

## 2018-10-24 NOTE — Telephone Encounter (Signed)
Returned patient's phone call regarding rescheduling an appointment, left a voicemail. 

## 2018-10-24 NOTE — Telephone Encounter (Signed)
Patient returned phone call regarding voicemail that was left, per patient's request 07/06 appointments have been moved to 07/14.  Message to provider.

## 2018-10-25 ENCOUNTER — Other Ambulatory Visit: Payer: Self-pay | Admitting: Hematology and Oncology

## 2018-11-01 ENCOUNTER — Encounter: Payer: Medicare Other | Admitting: Internal Medicine

## 2018-11-18 ENCOUNTER — Ambulatory Visit: Payer: Medicare Other | Admitting: Hematology and Oncology

## 2018-11-18 ENCOUNTER — Other Ambulatory Visit: Payer: Medicare Other

## 2018-11-23 ENCOUNTER — Other Ambulatory Visit: Payer: Self-pay | Admitting: Internal Medicine

## 2018-11-26 ENCOUNTER — Inpatient Hospital Stay (HOSPITAL_BASED_OUTPATIENT_CLINIC_OR_DEPARTMENT_OTHER): Payer: Medicare Other | Admitting: Hematology and Oncology

## 2018-11-26 ENCOUNTER — Encounter: Payer: Self-pay | Admitting: Hematology and Oncology

## 2018-11-26 ENCOUNTER — Other Ambulatory Visit: Payer: Self-pay

## 2018-11-26 ENCOUNTER — Inpatient Hospital Stay: Payer: Medicare Other | Attending: Hematology and Oncology

## 2018-11-26 DIAGNOSIS — Z79899 Other long term (current) drug therapy: Secondary | ICD-10-CM

## 2018-11-26 DIAGNOSIS — D630 Anemia in neoplastic disease: Secondary | ICD-10-CM | POA: Insufficient documentation

## 2018-11-26 DIAGNOSIS — Z7984 Long term (current) use of oral hypoglycemic drugs: Secondary | ICD-10-CM | POA: Diagnosis not present

## 2018-11-26 DIAGNOSIS — E1165 Type 2 diabetes mellitus with hyperglycemia: Secondary | ICD-10-CM | POA: Insufficient documentation

## 2018-11-26 DIAGNOSIS — D473 Essential (hemorrhagic) thrombocythemia: Secondary | ICD-10-CM | POA: Insufficient documentation

## 2018-11-26 LAB — CBC WITH DIFFERENTIAL/PLATELET
Abs Immature Granulocytes: 0.02 10*3/uL (ref 0.00–0.07)
Basophils Absolute: 0 10*3/uL (ref 0.0–0.1)
Basophils Relative: 1 %
Eosinophils Absolute: 0.1 10*3/uL (ref 0.0–0.5)
Eosinophils Relative: 2 %
HCT: 34.2 % — ABNORMAL LOW (ref 36.0–46.0)
Hemoglobin: 11.5 g/dL — ABNORMAL LOW (ref 12.0–15.0)
Immature Granulocytes: 0 %
Lymphocytes Relative: 38 %
Lymphs Abs: 2.3 10*3/uL (ref 0.7–4.0)
MCH: 35.3 pg — ABNORMAL HIGH (ref 26.0–34.0)
MCHC: 33.6 g/dL (ref 30.0–36.0)
MCV: 104.9 fL — ABNORMAL HIGH (ref 80.0–100.0)
Monocytes Absolute: 0.6 10*3/uL (ref 0.1–1.0)
Monocytes Relative: 10 %
Neutro Abs: 3.1 10*3/uL (ref 1.7–7.7)
Neutrophils Relative %: 49 %
Platelets: 480 10*3/uL — ABNORMAL HIGH (ref 150–400)
RBC: 3.26 MIL/uL — ABNORMAL LOW (ref 3.87–5.11)
RDW: 12.2 % (ref 11.5–15.5)
WBC: 6.2 10*3/uL (ref 4.0–10.5)
nRBC: 0 % (ref 0.0–0.2)

## 2018-11-26 NOTE — Progress Notes (Signed)
[facility] OFFICE PROGRESS NOTE  Patient Care Team: [name], MD as PCP - General Barbaraann Rondo, MD as Attending Physician (Obstetrics and Gynecology) Heath Lark, MD as Consulting Physician (Hematology and Oncology)  ASSESSMENT & PLAN:  Essential thrombocythemia She is doing well and remained asymptomatic Even though her platelet count is suboptimally controlled, the patient is not symptomatic and has never been diagnosed with blood clot. There is no concurrent leukocytosis. She has evidence of anemia and I am reluctant to increase the dose of hydroxyurea further. I recommend she continues taking hydroxyurea 1 tablet daily except on Saturdays and Sundays she takes 2 tablets I will see her back in 6 months for further follow-up. I discussed the importance of annual influenza vaccination  Anemia in neoplastic disease This is likely due to recent treatment and poorly controlled DM. The patient denies recent history of bleeding such as epistaxis, hematuria or hematochezia. She is asymptomatic from the anemia. I will observe for now.    No orders of the defined types were placed in this encounter.   INTERVAL HISTORY: Please see below for problem oriented charting. She returns for further follow-up She is feeling well Denies headache No leg cramps No new diagnosis of blood clot Her appetite is stable although she is attempting to lose some weight Her energy level is fair  SUMMARY OF ONCOLOGIC HISTORY:  This patient used to be a blood donor. She was refused blood donation for several years due to anemia. A review of her CBC dated back to 2012 in note of mild anemia with associated thrombocytosis. In May of this year, she underwent extensive evaluation. Iron studies show that she had borderline iron deficiency. In June 2014, she had a bone marrow aspirate and biopsy. Results are inconclusive but there are presence of atypical megakaryocytes. Cytogenetics a  study was normal. The patient was started on hydroxyurea. MPL mutation, BCR/ABL and JAK2 mutation testing was negative In December 2014, she had EGD and colonoscopy for evaluation of iron deficiency anemia and the tests were negative apart from mild nonspecific gastritis. She was placed on iron supplements, subsequently discontinued in January 2015 On 01/08/2014, dose of hydroxyurea was modified to. She is instructed to take 6 days a week and skip on Sundays. Since September 2015, she reduce hydroxyurea by skipping doses on Wednesday and Sundays On 05/04/2014, I reduce hydroxyurea dose further to 500 mg 4 days a week and to add anagrelide. Due to difficulties obtaining the pills, she had not been able to take hydroxyurea and anagrelide. On 06/04/14: dose of hydroxyurea is increased to 500 mg daily. In 2019, the dose of Hydrea was increased to 500 mg daily except on Saturdays and Sundays she take 1000 mg  REVIEW OF SYSTEMS:   Constitutional: Denies fevers, chills or abnormal weight loss Eyes: Denies blurriness of vision Ears, nose, mouth, throat, and face: Denies mucositis or sore throat Respiratory: Denies cough, dyspnea or wheezes Cardiovascular: Denies palpitation, chest discomfort or lower extremity swelling Gastrointestinal:  Denies nausea, heartburn or change in bowel habits Skin: Denies abnormal skin rashes Lymphatics: Denies new lymphadenopathy or easy bruising Neurological:Denies numbness, tingling or new weaknesses Behavioral/Psych: Mood is stable, no new changes  All other systems were reviewed with the patient and are negative.  I have reviewed the past medical history, past surgical history, social history and family history with the patient and they are unchanged from previous note.  ALLERGIES:  has No Known Allergies.  MEDICATIONS:  Current Outpatient  Medications  Medication Sig Dispense Refill  . Ascorbic Acid (VITAMIN C) 500 MG tablet Take 500 mg by mouth daily.      Marland Kitchen  aspirin 81 MG tablet Take 81 mg by mouth daily.      . calcium-vitamin D (OSCAL) 250-125 MG-UNIT per tablet Take 1 tablet by mouth daily.      . cetirizine (ZYRTEC) 10 MG tablet Take 10 mg by mouth daily as needed for allergies.     . Dulaglutide 1.5 MG/0.5ML SOPN Inject 1.5 mg into the skin once a week. 5 pen 5  . fish oil-omega-3 fatty acids 1000 MG capsule Take 1 g by mouth daily.     Marland Kitchen glipiZIDE (GLUCOTROL) 5 MG tablet TAKE 1 TABLET (5 MG TOTAL) BY MOUTH 2 (TWO) TIMES DAILY BEFORE A MEAL. 180 tablet 3  . hydroxyurea (HYDREA) 500 MG capsule TAKE 1 CAPSULE BY MOUTH DAILY 90 capsule 1  . metFORMIN (GLUCOPHAGE) 1000 MG tablet TAKE 1 TABLET BY MOUTH TWICE DAILY WITH MEALS 180 tablet 3  . Multiple Vitamin (MULTIVITAMIN) tablet Take 1 tablet by mouth daily.      Marland Kitchen omeprazole (PRILOSEC OTC) 20 MG tablet Take 20 mg by mouth daily.     . simvastatin (ZOCOR) 10 MG tablet TAKE 1 TABLET BY MOUTH ONCE DAILY 90 tablet 3   No current facility-administered medications for this visit.     PHYSICAL EXAMINATION: ECOG PERFORMANCE STATUS: 0 - Asymptomatic  Vitals:   11/26/18 0910  BP: 139/63  Pulse: 78  Resp: 18  Temp: 98.2 F (36.8 C)  SpO2: 100%   Filed Weights   11/26/18 0910  Weight: 158 lb 3.2 oz (71.8 kg)    GENERAL:alert, no distress and comfortable Musculoskeletal:no cyanosis of digits and no clubbing  NEURO: alert & oriented x 3 with fluent speech, no focal motor/sensory deficits  LABORATORY DATA:  I have reviewed the data as listed    Component Value Date/Time   NA 137 10/25/2017 1553   NA 136 03/31/2013 1414   K 5.0 10/25/2017 1553   K 4.0 03/31/2013 1414   CL 100 10/25/2017 1553   CO2 26 10/25/2017 1553   CO2 24 03/31/2013 1414   GLUCOSE 101 (H) 10/25/2017 1553   GLUCOSE 178 (H) 03/31/2013 1414   BUN 10 10/25/2017 1553   BUN 8.1 03/31/2013 1414   CREATININE 0.72 10/25/2017 1553   CREATININE 0.7 03/31/2013 1414   CALCIUM 10.1 10/25/2017 1553   CALCIUM 10.1 03/31/2013  1414   PROT 7.5 10/25/2017 1553   PROT 7.2 03/31/2013 1414   ALBUMIN 4.5 10/25/2017 1553   ALBUMIN 3.9 03/31/2013 1414   AST 18 10/25/2017 1553   AST 19 03/31/2013 1414   ALT 18 10/25/2017 1553   ALT 15 03/31/2013 1414   ALKPHOS 43 10/25/2017 1553   ALKPHOS 55 03/31/2013 1414   BILITOT 0.3 10/25/2017 1553   BILITOT 0.28 03/31/2013 1414   GFRNONAA 112.68 10/29/2009 1221    No results found for: SPEP, UPEP  Lab Results  Component Value Date   WBC 6.2 11/26/2018   NEUTROABS 3.1 11/26/2018   HGB 11.5 (L) 11/26/2018   HCT 34.2 (L) 11/26/2018   MCV 104.9 (H) 11/26/2018   PLT 480 (H) 11/26/2018      Chemistry      Component Value Date/Time   NA 137 10/25/2017 1553   NA 136 03/31/2013 1414   K 5.0 10/25/2017 1553   K 4.0 03/31/2013 1414   CL 100 10/25/2017 1553  CO2 26 10/25/2017 1553   CO2 24 03/31/2013 1414   BUN 10 10/25/2017 1553   BUN 8.1 03/31/2013 1414   CREATININE 0.72 10/25/2017 1553   CREATININE 0.7 03/31/2013 1414      Component Value Date/Time   CALCIUM 10.1 10/25/2017 1553   CALCIUM 10.1 03/31/2013 1414   ALKPHOS 43 10/25/2017 1553   ALKPHOS 55 03/31/2013 1414   AST 18 10/25/2017 1553   AST 19 03/31/2013 1414   ALT 18 10/25/2017 1553   ALT 15 03/31/2013 1414   BILITOT 0.3 10/25/2017 1553   BILITOT 0.28 03/31/2013 1414       All questions were answered. The patient knows to call the clinic with any problems, questions or concerns. No barriers to learning was detected.  I spent 10 minutes counseling the patient face to face. The total time spent in the appointment was 15 minutes and more than 50% was on counseling and review of test results  Heath Lark, MD 11/26/2018 10:05 AM

## 2018-11-26 NOTE — Assessment & Plan Note (Signed)
She is doing well and remained asymptomatic Even though her platelet count is suboptimally controlled, the patient is not symptomatic and has never been diagnosed with blood clot. There is no concurrent leukocytosis. She has evidence of anemia and I am reluctant to increase the dose of hydroxyurea further. I recommend she continues taking hydroxyurea 1 tablet daily except on Saturdays and Sundays she takes 2 tablets I will see her back in 6 months for further follow-up. I discussed the importance of annual influenza vaccination

## 2018-11-26 NOTE — Assessment & Plan Note (Signed)
This is likely due to recent treatment and poorly controlled DM. The patient denies recent history of bleeding such as epistaxis, hematuria or hematochezia. She is asymptomatic from the anemia. I will observe for now.  

## 2018-11-27 ENCOUNTER — Telehealth: Payer: Self-pay | Admitting: Hematology and Oncology

## 2018-11-27 NOTE — Telephone Encounter (Signed)
I could not reach patient regarding schedule  °

## 2018-12-04 ENCOUNTER — Other Ambulatory Visit: Payer: Self-pay | Admitting: Internal Medicine

## 2018-12-24 ENCOUNTER — Other Ambulatory Visit: Payer: Self-pay | Admitting: Internal Medicine

## 2018-12-31 ENCOUNTER — Ambulatory Visit (INDEPENDENT_AMBULATORY_CARE_PROVIDER_SITE_OTHER): Payer: Medicare Other | Admitting: Internal Medicine

## 2018-12-31 ENCOUNTER — Encounter: Payer: Self-pay | Admitting: Internal Medicine

## 2018-12-31 ENCOUNTER — Other Ambulatory Visit: Payer: Self-pay

## 2018-12-31 VITALS — BP 118/76 | HR 93 | Temp 98.0°F | Ht 64.0 in | Wt 159.0 lb

## 2018-12-31 DIAGNOSIS — E119 Type 2 diabetes mellitus without complications: Secondary | ICD-10-CM | POA: Diagnosis not present

## 2018-12-31 DIAGNOSIS — Z0000 Encounter for general adult medical examination without abnormal findings: Secondary | ICD-10-CM

## 2018-12-31 DIAGNOSIS — Z7189 Other specified counseling: Secondary | ICD-10-CM | POA: Diagnosis not present

## 2018-12-31 DIAGNOSIS — D473 Essential (hemorrhagic) thrombocythemia: Secondary | ICD-10-CM

## 2018-12-31 DIAGNOSIS — I471 Supraventricular tachycardia: Secondary | ICD-10-CM | POA: Diagnosis not present

## 2018-12-31 DIAGNOSIS — I10 Essential (primary) hypertension: Secondary | ICD-10-CM

## 2018-12-31 LAB — COMPREHENSIVE METABOLIC PANEL
ALT: 19 U/L (ref 0–35)
AST: 17 U/L (ref 0–37)
Albumin: 4.5 g/dL (ref 3.5–5.2)
Alkaline Phosphatase: 40 U/L (ref 39–117)
BUN: 8 mg/dL (ref 6–23)
CO2: 29 mEq/L (ref 19–32)
Calcium: 9.7 mg/dL (ref 8.4–10.5)
Chloride: 100 mEq/L (ref 96–112)
Creatinine, Ser: 0.7 mg/dL (ref 0.40–1.20)
GFR: 82.91 mL/min (ref >60.00)
Glucose, Bld: 154 mg/dL — ABNORMAL HIGH (ref 70–99)
Potassium: 5.3 mEq/L — ABNORMAL HIGH (ref 3.5–5.1)
Sodium: 136 mEq/L (ref 135–145)
Total Bilirubin: 0.4 mg/dL (ref 0.2–1.2)
Total Protein: 7.3 g/dL (ref 6.0–8.3)

## 2018-12-31 LAB — LIPID PANEL
Cholesterol: 137 mg/dL (ref 0–200)
HDL: 57.6 mg/dL (ref >39.00)
LDL Cholesterol: 65 mg/dL (ref 0–99)
NonHDL: 79.48
Total CHOL/HDL Ratio: 2
Triglycerides: 72 mg/dL (ref 0.0–149.0)
VLDL: 14.4 mg/dL (ref 0.0–40.0)

## 2018-12-31 LAB — HEMOGLOBIN A1C: Hgb A1c MFr Bld: 8.1 % — ABNORMAL HIGH (ref 4.6–6.5)

## 2018-12-31 NOTE — Assessment & Plan Note (Signed)
See social history 

## 2018-12-31 NOTE — Progress Notes (Signed)
Subjective:    Patient ID: Savannah Evans, female    DOB: June 20, 1950, 68 y.o.   MRN: [id number]  HPI Here for Medicare wellness visit and follow up of chronic health conditions Reviewed form and advanced directives Reviewed other doctors No alcohol or tobacco Tries to do some exercise Vision is fine Hearing is good No falls No depression or anhedonia Independent with instrumental ADLs No sig memory problems  Is finally "retiring"--- 9/15 Has been virtual for now Her position is moving to [place] threw her plans off (was going to be June) Has 1.5 years severance Looking forward to other things  Checking sugars rarely Feels great Usually ~150 but doesn't usually check fasting No foot numbness, pain or tingling Had to reschedule eye exam--Patty  Continues on the same hydroxyurea dose Platelets controlled on this No thrombosis  No palpitations No fluttering No chest pain or SOB No dizziness or syncope No edema  Current Outpatient Medications on File Prior to Visit  Medication Sig Dispense Refill  . Ascorbic Acid (VITAMIN C) 500 MG tablet Take 500 mg by mouth daily.      Marland Kitchen aspirin 81 MG tablet Take 81 mg by mouth daily.      . calcium-vitamin D (OSCAL) 250-125 MG-UNIT per tablet Take 1 tablet by mouth daily.      . cetirizine (ZYRTEC) 10 MG tablet Take 10 mg by mouth daily as needed for allergies.     . fish oil-omega-3 fatty acids 1000 MG capsule Take 1 g by mouth daily.     Marland Kitchen glipiZIDE (GLUCOTROL) 5 MG tablet TAKE 1 TABLET (5 MG TOTAL) BY MOUTH 2 (TWO) TIMES DAILY BEFORE A MEAL. 180 tablet 3  . hydroxyurea (HYDREA) 500 MG capsule TAKE 1 CAPSULE BY MOUTH DAILY 90 capsule 1  . metFORMIN (GLUCOPHAGE) 1000 MG tablet TAKE 1 TABLET BY MOUTH TWICE A DAY W/ A MEAL 180 tablet 3  . Multiple Vitamin (MULTIVITAMIN) tablet Take 1 tablet by mouth daily.      Marland Kitchen omeprazole (PRILOSEC OTC) 20 MG tablet Take 20 mg by mouth daily.     . simvastatin (ZOCOR) 10 MG tablet TAKE 1  TABLET BY MOUTH ONCE DAILY 90 tablet 3  . TRULICITY 1.5 BJ/6.2GB SOPN INJECT 1.5 MG INTO THE SKIN ONCE A WEEK. 2 pen 11   No current facility-administered medications on file prior to visit.     No Known Allergies  Past Medical History:  Diagnosis Date  . Allergic rhinitis   . Anemia, unspecified 03/03/2013  . Diabetes mellitus type 2, noninsulin dependent ([facility])   . Diaphragmatic hernia   . Essential thrombocythemia ([facility]) 10/21/2012   JAK2 neg; BCR-ABL neg; bone marrow biopsy on 09/29/12 showed normal cytogenetics.   Marland Kitchen GERD (gastroesophageal reflux disease)   . History of palpitations   . Hyperlipidemia   . Iron deficiency   . Osteoarthritis   . SVT (supraventricular tachycardia) ([facility])     Past Surgical History:  Procedure Laterality Date  . COLONOSCOPY     neg age 24.  Next age 72.   . svt ablation  2000   Dr. Caryl Comes    Family History  Problem Relation Age of Onset  . Heart attack Father   . Uterine cancer Sister   . Diabetes Sister   . Macular degeneration Mother   . Cancer Paternal Aunt        cancer?  . Cancer Paternal Uncle        cancer?  . Colon  cancer Neg Hx   . Breast cancer Neg Hx     Social History   Socioeconomic History  . Marital status: Married    Spouse name: Not on file  . Number of children: 3  . Years of education: Not on file  . Highest education level: Not on file  Occupational History  . Occupation: Automotive engineer area for [employer]  . Financial resource strain: Not on file  . Food insecurity    Worry: Not on file    Inability: Not on file  . Transportation needs    Medical: Not on file    Non-medical: Not on file  Tobacco Use  . Smoking status: Never Smoker  . Smokeless tobacco: Never Used  Substance and Sexual Activity  . Alcohol use: Yes    Alcohol/week: 1.0 standard drinks    Types: 1 Glasses of wine per week    Comment: social  . Drug use: No  . Sexual activity: Not on file  Lifestyle   . Physical activity    Days per week: Not on file    Minutes per session: Not on file  . Stress: Not on file  Relationships  . Social Herbalist on phone: Not on file    Gets together: Not on file    Attends religious service: Not on file    Active member of club or organization: Not on file    Attends meetings of clubs or organizations: Not on file    Relationship status: Not on file  . Intimate partner violence    Fear of current or ex partner: Not on file    Emotionally abused: Not on file    Physically abused: Not on file    Forced sexual activity: Not on file  Other Topics Concern  . Not on file  Social History Narrative   Has living will   Requests husband as health care POA   Would accept resuscitation attempts   No tube feeds if cognitively unaware   Review of Systems  Appetite is fine Weight is stable Sleeps okay---sometimes will awaken thinking about stuff  Wears seat belt Teeth are fine--keeps up with dentist No recent skin problems Rare heartburn (only if he eats late). No dysphagia Bowels are fine--no blood Voids fine. No sig incontinence (rare stress issues)     Objective:   Physical Exam  Constitutional: She is oriented to person, place, and time. She appears well-developed. No distress.  HENT:  Mouth/Throat: Oropharynx is clear and moist. No oropharyngeal exudate.  Neck: No thyromegaly present.  Cardiovascular: Normal rate, regular rhythm, normal heart sounds and intact distal pulses. Exam reveals no gallop.  No murmur heard. Respiratory: Effort normal and breath sounds normal. No respiratory distress. She has no wheezes. She has no rales.  GI: Soft. There is no abdominal tenderness.  Musculoskeletal:        General: No tenderness or edema.  Lymphadenopathy:    She has no cervical adenopathy.  Neurological: She is alert and oriented to person, place, and time.  President--- "Dwaine Deter, Bush" [phone] [id number] Recall  3/3   Normal sensation in feet  Skin: No rash noted. No erythema.  No foot lesions  Psychiatric: She has a normal mood and affect. Her behavior is normal.           Assessment & Plan:

## 2018-12-31 NOTE — Assessment & Plan Note (Signed)
I have personally reviewed the Medicare Annual Wellness questionnaire and have noted 1. The patient's medical and social history 2. Their use of alcohol, tobacco or illicit drugs 3. Their current medications and supplements 4. The patient's functional ability including ADL's, fall risks, home safety risks and hearing or visual             impairment. 5. Diet and physical activities 6. Evidence for depression or mood disorders  The patients weight, height, BMI and visual acuity have been recorded in the chart I have made referrals, counseling and provided education to the patient based review of the above and I have provided the pt with a written personalized care plan for preventive services.  I have provided you with a copy of your personalized plan for preventive services. Please take the time to review along with your updated medication list.  Yearly flu vaccine Consider shingrix vaccine at pharmacy Colon due 2024 Recent mammogram--- next 1/22 Discussed fitness

## 2018-12-31 NOTE — Assessment & Plan Note (Signed)
BP Readings from Last 3 Encounters:  12/31/18 118/76  11/26/18 139/63  08/16/18 (!) 143/70   Good control

## 2018-12-31 NOTE — Assessment & Plan Note (Signed)
No symptoms to suggest recurrence. 

## 2018-12-31 NOTE — Assessment & Plan Note (Signed)
Seems to have good control Will check labs 

## 2018-12-31 NOTE — Assessment & Plan Note (Signed)
Stable on hydroxyurea

## 2019-01-04 ENCOUNTER — Other Ambulatory Visit: Payer: Self-pay | Admitting: Internal Medicine

## 2019-02-04 LAB — HM DIABETES EYE EXAM

## 2019-04-05 ENCOUNTER — Other Ambulatory Visit: Payer: Self-pay | Admitting: Hematology and Oncology

## 2019-05-30 ENCOUNTER — Encounter: Payer: Self-pay | Admitting: Hematology and Oncology

## 2019-05-30 ENCOUNTER — Inpatient Hospital Stay: Payer: Medicare Other

## 2019-05-30 ENCOUNTER — Other Ambulatory Visit: Payer: Self-pay

## 2019-05-30 ENCOUNTER — Other Ambulatory Visit: Payer: Self-pay | Admitting: Hematology and Oncology

## 2019-05-30 ENCOUNTER — Inpatient Hospital Stay: Payer: Medicare Other | Attending: Hematology and Oncology | Admitting: Hematology and Oncology

## 2019-05-30 DIAGNOSIS — D649 Anemia, unspecified: Secondary | ICD-10-CM | POA: Insufficient documentation

## 2019-05-30 DIAGNOSIS — E785 Hyperlipidemia, unspecified: Secondary | ICD-10-CM | POA: Insufficient documentation

## 2019-05-30 DIAGNOSIS — K219 Gastro-esophageal reflux disease without esophagitis: Secondary | ICD-10-CM | POA: Insufficient documentation

## 2019-05-30 DIAGNOSIS — D473 Essential (hemorrhagic) thrombocythemia: Secondary | ICD-10-CM | POA: Insufficient documentation

## 2019-05-30 DIAGNOSIS — Z794 Long term (current) use of insulin: Secondary | ICD-10-CM | POA: Insufficient documentation

## 2019-05-30 DIAGNOSIS — E119 Type 2 diabetes mellitus without complications: Secondary | ICD-10-CM | POA: Insufficient documentation

## 2019-05-30 DIAGNOSIS — Z79899 Other long term (current) drug therapy: Secondary | ICD-10-CM | POA: Insufficient documentation

## 2019-05-30 DIAGNOSIS — Z7982 Long term (current) use of aspirin: Secondary | ICD-10-CM | POA: Diagnosis not present

## 2019-05-30 LAB — CBC WITH DIFFERENTIAL/PLATELET
Abs Immature Granulocytes: 0.02 10*3/uL (ref 0.00–0.07)
Basophils Absolute: 0 10*3/uL (ref 0.0–0.1)
Basophils Relative: 1 %
Eosinophils Absolute: 0.1 10*3/uL (ref 0.0–0.5)
Eosinophils Relative: 2 %
HCT: 32.3 % — ABNORMAL LOW (ref 36.0–46.0)
Hemoglobin: 11.2 g/dL — ABNORMAL LOW (ref 12.0–15.0)
Immature Granulocytes: 0 %
Lymphocytes Relative: 42 %
Lymphs Abs: 2.4 10*3/uL (ref 0.7–4.0)
MCH: 35.6 pg — ABNORMAL HIGH (ref 26.0–34.0)
MCHC: 34.7 g/dL (ref 30.0–36.0)
MCV: 102.5 fL — ABNORMAL HIGH (ref 80.0–100.0)
Monocytes Absolute: 0.5 10*3/uL (ref 0.1–1.0)
Monocytes Relative: 8 %
Neutro Abs: 2.7 10*3/uL (ref 1.7–7.7)
Neutrophils Relative %: 47 %
Platelets: 571 10*3/uL — ABNORMAL HIGH (ref 150–400)
RBC: 3.15 MIL/uL — ABNORMAL LOW (ref 3.87–5.11)
RDW: 11.9 % (ref 11.5–15.5)
WBC: 5.6 10*3/uL (ref 4.0–10.5)
nRBC: 0 % (ref 0.0–0.2)

## 2019-05-30 MED ORDER — HYDROXYUREA 500 MG PO CAPS: ORAL_CAPSULE | ORAL | 1 refills | Status: DC

## 2019-05-30 NOTE — Assessment & Plan Note (Signed)
She is doing well and remained asymptomatic Previously, we discussed taking more hydroxyurea over the weekend but the patient was not taking it correctly She states she was occasionally confused with the direction on her pill bottle I have made changes to her medication list today I reinforced the importance of her taking hydroxyurea 1 tablet daily except on Saturdays and Sundays she takes 2 tablets She has appointment pending next month to follow with her primary care doctor I will request a primary care doctor to check a CBC I will reviewed the test results with her If her blood counts are better, I will schedule her appointment to see me back in 6 months However, if her blood counts are not improving, I will adjust the dose of hydroxyurea further She agreed with the plan of care

## 2019-05-30 NOTE — Progress Notes (Signed)
[place] OFFICE PROGRESS NOTE  Savannah Carbon, MD  ASSESSMENT & PLAN:  Essential thrombocythemia She is doing well and remained asymptomatic Previously, we discussed taking more hydroxyurea over the weekend but the patient was not taking it correctly She states she was occasionally confused with the direction on her pill bottle I have made changes to her medication list today I reinforced the importance of her taking hydroxyurea 1 tablet daily except on Saturdays and Sundays she takes 2 tablets She has appointment pending next month to follow with her primary care doctor I will request a primary care doctor to check a CBC I will reviewed the test results with her If her blood counts are better, I will schedule her appointment to see me back in 6 months However, if her blood counts are not improving, I will adjust the dose of hydroxyurea further She agreed with the plan of care   No orders of the defined types were placed in this encounter.   The total time spent in the appointment was 15 minutes encounter with patients including review of chart and various tests results, discussions about plan of care and coordination of care plan   All questions were answered. The patient knows to call the clinic with any problems, questions or concerns. No barriers to learning was detected.    Heath Lark, MD 1/15/20211:45 PM  INTERVAL HISTORY: Savannah Evans 69 y.o. female returns for further follow-up on essential thrombocytosis She feels well.  No recent infection, fever or chills When I sked the patient about her medication intake, she admits that she was not taking hydroxyurea correctly She stated she forgot sometimes that she is supposed to take more hydroxyurea over the weekend but was not doing so because of confusion over medication instruction over her pill bottle   SUMMARY OF HEMATOLOGIC HISTORY:  This patient used to be a blood donor. She was refused blood  donation for several years due to anemia. A review of her CBC dated back to 2012 in note of mild anemia with associated thrombocytosis. In May of this year, she underwent extensive evaluation. Iron studies show that she had borderline iron deficiency. In June 2014, she had a bone marrow aspirate and biopsy. Results are inconclusive but there are presence of atypical megakaryocytes. Cytogenetics a study was normal. The patient was started on hydroxyurea. MPL mutation, BCR/ABL and JAK2 mutation testing was negative In December 2014, she had EGD and colonoscopy for evaluation of iron deficiency anemia and the tests were negative apart from mild nonspecific gastritis. She was placed on iron supplements, subsequently discontinued in January 2015 On 01/08/2014, dose of hydroxyurea was modified to. She is instructed to take 6 days a week and skip on Sundays. Since September 2015, she reduce hydroxyurea by skipping doses on Wednesday and Sundays On 05/04/2014, I reduce hydroxyurea dose further to 500 mg 4 days a week and to add anagrelide. Due to difficulties obtaining the pills, she had not been able to take hydroxyurea and anagrelide. On 06/04/14: dose of hydroxyurea is increased to 500 mg daily. In 2019, the dose of Hydrea was increased to 500 mg daily except on Saturdays and Sundays she take 1000 mg   I have reviewed the past medical history, past surgical history, social history and family history with the patient and they are unchanged from previous note.  ALLERGIES:  has No Known Allergies.  MEDICATIONS:  Current Outpatient Medications  Medication Sig Dispense Refill  . Ascorbic Acid (VITAMIN  C) 500 MG tablet Take 500 mg by mouth daily.      Marland Kitchen aspirin 81 MG tablet Take 81 mg by mouth daily.      . calcium-vitamin D (OSCAL) 250-125 MG-UNIT per tablet Take 1 tablet by mouth daily.      . cetirizine (ZYRTEC) 10 MG tablet Take 10 mg by mouth daily as needed for allergies.     . fish oil-omega-3  fatty acids 1000 MG capsule Take 1 g by mouth daily.     Marland Kitchen glipiZIDE (GLUCOTROL) 5 MG tablet TAKE 1 TABLET (5 MG TOTAL) BY MOUTH 2 (TWO) TIMES DAILY BEFORE A MEAL. 180 tablet 3  . hydroxyurea (HYDREA) 500 MG capsule Take 1 capsule daily from Mondays to Fridays and 2 capsules on Saturdays and Sundays 90 capsule 1  . Insulin Pen Needle (FIFTY50 PEN NEEDLES) 32G X 4 MM MISC Use    . liraglutide (VICTOZA) 18 MG/3ML SOPN Inject into the skin.    . metFORMIN (GLUCOPHAGE) 1000 MG tablet TAKE 1 TABLET BY MOUTH TWICE A DAY W/ A MEAL 180 tablet 3  . Multiple Vitamin (MULTIVITAMIN) tablet Take 1 tablet by mouth daily.      Marland Kitchen omeprazole (PRILOSEC OTC) 20 MG tablet Take 20 mg by mouth daily.     . simvastatin (ZOCOR) 10 MG tablet TAKE 1 TABLET BY MOUTH DAILY 90 tablet 3  . TRULICITY 1.5 0000000 SOPN INJECT 1.5 MG INTO THE SKIN ONCE A WEEK. 2 pen 11   No current facility-administered medications for this visit.     REVIEW OF SYSTEMS:   Constitutional: Denies fevers, chills or night sweats Eyes: Denies blurriness of vision Ears, nose, mouth, throat, and face: Denies mucositis or sore throat Respiratory: Denies cough, dyspnea or wheezes Cardiovascular: Denies palpitation, chest discomfort or lower extremity swelling Gastrointestinal:  Denies nausea, heartburn or change in bowel habits Skin: Denies abnormal skin rashes Lymphatics: Denies new lymphadenopathy or easy bruising Neurological:Denies numbness, tingling or new weaknesses Behavioral/Psych: Mood is stable, no new changes  All other systems were reviewed with the patient and are negative.  PHYSICAL EXAMINATION: ECOG PERFORMANCE STATUS: 0 - Asymptomatic  Vitals:   05/30/19 0909  BP: (!) 146/57  Pulse: 94  Resp: 18  Temp: 98.1 F (36.7 C)  SpO2: 98%   Filed Weights   05/30/19 0909  Weight: 158 lb 9.6 oz (71.9 kg)    GENERAL:alert, no distress and comfortable NEURO: alert & oriented x 3 with fluent speech, no focal motor/sensory  deficits  LABORATORY DATA:  I have reviewed the data as listed     Component Value Date/Time   NA 136 12/31/2018 1122   NA 136 03/31/2013 1414   K 5.3 (H) 12/31/2018 1122   K 4.0 03/31/2013 1414   CL 100 12/31/2018 1122   CO2 29 12/31/2018 1122   CO2 24 03/31/2013 1414   GLUCOSE 154 (H) 12/31/2018 1122   GLUCOSE 178 (H) 03/31/2013 1414   BUN 8 12/31/2018 1122   BUN 8.1 03/31/2013 1414   CREATININE 0.70 12/31/2018 1122   CREATININE 0.7 03/31/2013 1414   CALCIUM 9.7 12/31/2018 1122   CALCIUM 10.1 03/31/2013 1414   PROT 7.3 12/31/2018 1122   PROT 7.2 03/31/2013 1414   ALBUMIN 4.5 12/31/2018 1122   ALBUMIN 3.9 03/31/2013 1414   AST 17 12/31/2018 1122   AST 19 03/31/2013 1414   ALT 19 12/31/2018 1122   ALT 15 03/31/2013 1414   ALKPHOS 40 12/31/2018 1122   ALKPHOS 55 03/31/2013  1414   BILITOT 0.4 12/31/2018 1122   BILITOT 0.28 03/31/2013 1414   GFRNONAA 112.68 10/29/2009 1221    No results found for: SPEP, UPEP  Lab Results  Component Value Date   WBC 5.6 05/30/2019   NEUTROABS 2.7 05/30/2019   HGB 11.2 (L) 05/30/2019   HCT 32.3 (L) 05/30/2019   MCV 102.5 (H) 05/30/2019   PLT 571 (H) 05/30/2019      Chemistry      Component Value Date/Time   NA 136 12/31/2018 1122   NA 136 03/31/2013 1414   K 5.3 (H) 12/31/2018 1122   K 4.0 03/31/2013 1414   CL 100 12/31/2018 1122   CO2 29 12/31/2018 1122   CO2 24 03/31/2013 1414   BUN 8 12/31/2018 1122   BUN 8.1 03/31/2013 1414   CREATININE 0.70 12/31/2018 1122   CREATININE 0.7 03/31/2013 1414      Component Value Date/Time   CALCIUM 9.7 12/31/2018 1122   CALCIUM 10.1 03/31/2013 1414   ALKPHOS 40 12/31/2018 1122   ALKPHOS 55 03/31/2013 1414   AST 17 12/31/2018 1122   AST 19 03/31/2013 1414   ALT 19 12/31/2018 1122   ALT 15 03/31/2013 1414   BILITOT 0.4 12/31/2018 1122   BILITOT 0.28 03/31/2013 1414

## 2019-06-24 ENCOUNTER — Other Ambulatory Visit: Payer: Self-pay

## 2019-06-24 ENCOUNTER — Ambulatory Visit (INDEPENDENT_AMBULATORY_CARE_PROVIDER_SITE_OTHER): Payer: Medicare Other | Admitting: Internal Medicine

## 2019-06-24 ENCOUNTER — Encounter: Payer: Self-pay | Admitting: Internal Medicine

## 2019-06-24 VITALS — BP 132/80 | HR 89 | Temp 96.4°F | Ht 64.0 in | Wt 157.0 lb

## 2019-06-24 DIAGNOSIS — I10 Essential (primary) hypertension: Secondary | ICD-10-CM | POA: Diagnosis not present

## 2019-06-24 DIAGNOSIS — D473 Essential (hemorrhagic) thrombocythemia: Secondary | ICD-10-CM

## 2019-06-24 DIAGNOSIS — E1159 Type 2 diabetes mellitus with other circulatory complications: Secondary | ICD-10-CM

## 2019-06-24 DIAGNOSIS — I471 Supraventricular tachycardia: Secondary | ICD-10-CM | POA: Diagnosis not present

## 2019-06-24 LAB — CBC
HCT: 31.7 % — ABNORMAL LOW (ref 36.0–46.0)
Hemoglobin: 10.7 g/dL — ABNORMAL LOW (ref 12.0–15.0)
MCHC: 33.7 g/dL (ref 30.0–36.0)
MCV: 104.5 fl — ABNORMAL HIGH (ref 78.0–100.0)
Platelets: 524 10*3/uL — ABNORMAL HIGH (ref 150.0–400.0)
RBC: 3.03 Mil/uL — ABNORMAL LOW (ref 3.87–5.11)
RDW: 12.6 % (ref 11.5–15.5)
WBC: 5.5 10*3/uL (ref 4.0–10.5)

## 2019-06-24 LAB — HEMOGLOBIN A1C: Hgb A1c MFr Bld: 7.8 % — ABNORMAL HIGH (ref 4.6–6.5)

## 2019-06-24 NOTE — Assessment & Plan Note (Signed)
No symptoms of recurrence lately No meds now

## 2019-06-24 NOTE — Assessment & Plan Note (Signed)
Hopefully still good control Urged her to check at least monthly HTN as well

## 2019-06-24 NOTE — Progress Notes (Signed)
Subjective:    Patient ID: Savannah Evans, female    DOB: April 08, 1951, 69 y.o.   MRN: [id number]  HPI Here for follow up of diabetes and other chronic health conditions This visit occurred during the SARS-CoV-2 public health emergency.  Safety protocols were in place, including screening questions prior to the visit, additional usage of staff PPE, and extensive cleaning of exam room while observing appropriate contact time as indicated for disinfecting solutions.   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 Ascorbic Acid (VITAMIN C) 500 MG tablet Take 500 mg by mouth daily.      Marland Kitchen aspirin 81 MG tablet Take 81 mg by mouth daily.      . calcium-vitamin D (OSCAL) 250-125 MG-UNIT per tablet Take 1 tablet by mouth daily.      . cetirizine (ZYRTEC) 10 MG tablet Take 10 mg by mouth daily as needed for allergies.     . fish oil-omega-3 fatty acids 1000 MG capsule Take 1 g by mouth daily.     Marland Kitchen glipiZIDE (GLUCOTROL) 5 MG tablet TAKE 1 TABLET (5 MG TOTAL) BY MOUTH 2 (TWO) TIMES DAILY BEFORE A MEAL. 180 tablet 3  . hydroxyurea (HYDREA) 500 MG capsule Take 1 capsule daily from Mondays to Fridays and 2 capsules on Saturdays and Sundays 90 capsule 1  . metFORMIN (GLUCOPHAGE) 1000 MG tablet TAKE 1 TABLET BY MOUTH TWICE A DAY W/ A MEAL 180 tablet 3  . Multiple Vitamin (MULTIVITAMIN) tablet Take 1 tablet by mouth daily.      Marland Kitchen omeprazole (PRILOSEC OTC) 20 MG tablet Take 20 mg by mouth daily.     . simvastatin (ZOCOR) 10 MG tablet TAKE 1 TABLET BY MOUTH DAILY 90 tablet 3  . TRULICITY 1.5 QT/6.2UQ SOPN INJECT 1.5 MG INTO THE SKIN ONCE A WEEK. 2 pen 11   No current  facility-administered medications on file prior to visit.    No Known Allergies  Past Medical History:  Diagnosis Date  . Allergic rhinitis   . Anemia, unspecified 03/03/2013  . Diabetes mellitus type 2, noninsulin dependent ([place])   . Diaphragmatic hernia   . Essential thrombocythemia ([place]) 10/21/2012   JAK2 neg; BCR-ABL neg; bone marrow biopsy on 09/29/12 showed normal cytogenetics.   Marland Kitchen GERD (gastroesophageal reflux disease)   . History of palpitations   . Hyperlipidemia   . Iron deficiency   . Osteoarthritis   . SVT (supraventricular tachycardia) (HCC)     Past Surgical History:  Procedure Laterality Date  . COLONOSCOPY     neg age 26.  Next age 19.   . svt ablation  2000   Dr. Caryl Comes    Family History  Problem Relation Age of Onset  . Heart attack Father   . Uterine cancer Sister   . Diabetes Sister   . Macular degeneration Mother   . Cancer Paternal Aunt        cancer?  . Cancer Paternal Uncle        cancer?  . Colon cancer Neg Hx   . Breast cancer Neg Hx     Social History   Socioeconomic History  . Marital  status: Married    Spouse name: Not on file  . Number of children: 3  . Years of education: Not on file  . Highest education level: Not on file  Occupational History  . Occupation: Automotive engineer area for [employer]  Tobacco Use  . Smoking status: Never Smoker  . Smokeless tobacco: Never Used  Substance and Sexual Activity  . Alcohol use: Yes    Alcohol/week: 1.0 standard drinks    Types: 1 Glasses of wine per week    Comment: social  . Drug use: No  . Sexual activity: Not on file  Other Topics Concern  . Not on file  Social History Narrative   Has living will   Requests husband as health care POA-- alternate is daughter Colletta [place]   Would accept resuscitation attempts but no prolonged ventilation   No tube feeds if cognitively unaware   Social Determinants of Health   Financial Resource Strain:   . Difficulty of Paying  Living Expenses: Not on file  Food Insecurity:   . Worried About Charity fundraiser in the Last Year: Not on file  . Ran Out of Food in the Last Year: Not on file  Transportation Needs:   . Lack of Transportation (Medical): Not on file  . Lack of Transportation (Non-Medical): Not on file  Physical Activity:   . Days of Exercise per Week: Not on file  . Minutes of Exercise per Session: Not on file  Stress:   . Feeling of Stress : Not on file  Social Connections:   . Frequency of Communication with Friends and Family: Not on file  . Frequency of Social Gatherings with Friends and Family: Not on file  . Attends Religious Services: Not on file  . Active Member of Clubs or Organizations: Not on file  . Attends Archivist Meetings: Not on file  . Marital Status: Not on file  Intimate Partner Violence:   . Fear of Current or Ex-Partner: Not on file  . Emotionally Abused: Not on file  . Physically Abused: Not on file  . Sexually Abused: Not on file   Review of Systems Appetite is good Weight is stable Sleeps okay    Objective:   Physical Exam  Constitutional: She appears well-developed. No distress.  Neck: No thyromegaly present.  Cardiovascular: Normal rate, regular rhythm and intact distal pulses. Exam reveals no gallop.  No murmur heard. Respiratory: Effort normal and breath sounds normal. No respiratory distress. She has no wheezes. She has no rales.  Musculoskeletal:        General: No tenderness or edema.  Lymphadenopathy:    She has no cervical adenopathy.  Psychiatric: She has a normal mood and affect. Her behavior is normal.           Assessment & Plan:

## 2019-06-24 NOTE — Assessment & Plan Note (Signed)
Will recheck CBC on the higher hydrea dose

## 2019-06-24 NOTE — Assessment & Plan Note (Signed)
BP Readings from Last 3 Encounters:  06/24/19 132/80  05/30/19 (!) 146/57  12/31/18 118/76   Doing okay without meds now

## 2019-06-26 ENCOUNTER — Telehealth: Payer: Self-pay

## 2019-06-26 NOTE — Telephone Encounter (Signed)
RN left message notify that MD received labs from PCP - Recommendations for four month follow up scheduling message sent.    Contact information left for patient to return call to follow up.

## 2019-06-30 ENCOUNTER — Telehealth: Payer: Self-pay | Admitting: Hematology and Oncology

## 2019-06-30 NOTE — Telephone Encounter (Signed)
Per sch msg 2/11. Pt is aware of date and time

## 2019-07-08 ENCOUNTER — Ambulatory Visit: Payer: Medicare Other | Admitting: Internal Medicine

## 2019-09-15 DIAGNOSIS — Z23 Encounter for immunization: Secondary | ICD-10-CM | POA: Diagnosis not present

## 2019-09-16 ENCOUNTER — Telehealth: Payer: Self-pay | Admitting: Internal Medicine

## 2019-09-16 ENCOUNTER — Other Ambulatory Visit: Payer: Self-pay | Admitting: Hematology and Oncology

## 2019-09-16 NOTE — Progress Notes (Signed)
  Chronic Care Management   Note  09/16/2019 Name: Savannah Evans MRN: [id number] DOB: [birth year]  Savannah Evans is a 69 y.o. year old female who is a primary care patient of [name], MD. I reached out to Juanetta Beets by phone today in response to a referral sent by Ms. Harlin Rain Tricarico's PCP, [name], MD.   Ms. Samadi was given information about Chronic Care Management services today including:  1. CCM service includes personalized support from designated clinical staff supervised by her physician, including individualized plan of care and coordination with other care providers 2. 24/7 contact phone numbers for assistance for urgent and routine care needs. 3. Service will only be billed when office clinical staff spend 20 minutes or more in a month to coordinate care. 4. Only one practitioner may furnish and bill the service in a calendar month. 5. The patient may stop CCM services at any time (effective at the end of the month) by phone call to the office staff.   Patient agreed to services and verbal consent obtained.    This note is not being shared with the patient for the following reason: To respect privacy (The patient or proxy has requested that the information not be shared).  Follow up plan:   Raynicia Dukes [employer] Scheduler

## 2019-10-30 ENCOUNTER — Telehealth: Payer: Self-pay | Admitting: Hematology and Oncology

## 2019-10-30 ENCOUNTER — Inpatient Hospital Stay: Payer: Medicare Other

## 2019-10-30 ENCOUNTER — Inpatient Hospital Stay: Payer: Medicare Other | Attending: Hematology and Oncology | Admitting: Hematology and Oncology

## 2019-10-30 ENCOUNTER — Encounter: Payer: Self-pay | Admitting: Hematology and Oncology

## 2019-10-30 ENCOUNTER — Other Ambulatory Visit: Payer: Self-pay

## 2019-10-30 DIAGNOSIS — D630 Anemia in neoplastic disease: Secondary | ICD-10-CM | POA: Diagnosis not present

## 2019-10-30 DIAGNOSIS — D473 Essential (hemorrhagic) thrombocythemia: Secondary | ICD-10-CM

## 2019-10-30 DIAGNOSIS — Z7984 Long term (current) use of oral hypoglycemic drugs: Secondary | ICD-10-CM | POA: Diagnosis not present

## 2019-10-30 DIAGNOSIS — Z79899 Other long term (current) drug therapy: Secondary | ICD-10-CM | POA: Diagnosis not present

## 2019-10-30 DIAGNOSIS — D649 Anemia, unspecified: Secondary | ICD-10-CM | POA: Insufficient documentation

## 2019-10-30 DIAGNOSIS — Z7982 Long term (current) use of aspirin: Secondary | ICD-10-CM | POA: Diagnosis not present

## 2019-10-30 LAB — CBC WITH DIFFERENTIAL/PLATELET
Abs Immature Granulocytes: 0.01 10*3/uL (ref 0.00–0.07)
Basophils Absolute: 0.1 10*3/uL (ref 0.0–0.1)
Basophils Relative: 1 %
Eosinophils Absolute: 0.1 10*3/uL (ref 0.0–0.5)
Eosinophils Relative: 2 %
HCT: 32.4 % — ABNORMAL LOW (ref 36.0–46.0)
Hemoglobin: 11 g/dL — ABNORMAL LOW (ref 12.0–15.0)
Immature Granulocytes: 0 %
Lymphocytes Relative: 45 %
Lymphs Abs: 2.3 10*3/uL (ref 0.7–4.0)
MCH: 35.4 pg — ABNORMAL HIGH (ref 26.0–34.0)
MCHC: 34 g/dL (ref 30.0–36.0)
MCV: 104.2 fL — ABNORMAL HIGH (ref 80.0–100.0)
Monocytes Absolute: 0.5 10*3/uL (ref 0.1–1.0)
Monocytes Relative: 10 %
Neutro Abs: 2.2 10*3/uL (ref 1.7–7.7)
Neutrophils Relative %: 42 %
Platelets: 491 10*3/uL — ABNORMAL HIGH (ref 150–400)
RBC: 3.11 MIL/uL — ABNORMAL LOW (ref 3.87–5.11)
RDW: 12.6 % (ref 11.5–15.5)
WBC: 5.2 10*3/uL (ref 4.0–10.5)
nRBC: 0 % (ref 0.0–0.2)

## 2019-10-30 NOTE — Assessment & Plan Note (Signed)
This is likely due to recent treatment and poorly controlled DM. The patient denies recent history of bleeding such as epistaxis, hematuria or hematochezia. She is asymptomatic from the anemia. I will observe for now.

## 2019-10-30 NOTE — Assessment & Plan Note (Signed)
She is doing well and remained asymptomatic She is compliant taking hydroxyurea as directed Due to improvement of her platelet count, I plan to see her back in 6 months

## 2019-10-30 NOTE — Progress Notes (Signed)
[place] OFFICE PROGRESS NOTE  Patient Care Team: [name], MD as PCP - General Barbaraann Rondo, MD as Attending Physician (Obstetrics and Gynecology) Heath Lark, MD as Consulting Physician (Hematology and Oncology) Debbora Dus, [facility] as Pharmacist (Pharmacist)  ASSESSMENT & PLAN:  Essential thrombocythemia She is doing well and remained asymptomatic She is compliant taking hydroxyurea as directed Due to improvement of her platelet count, I plan to see her back in 6 months  Anemia in neoplastic disease This is likely due to recent treatment and poorly controlled DM. The patient denies recent history of bleeding such as epistaxis, hematuria or hematochezia. She is asymptomatic from the anemia. I will observe for now.    No orders of the defined types were placed in this encounter.   All questions were answered. The patient knows to call the clinic with any problems, questions or concerns. The total time spent in the appointment was 15 minutes encounter with patients including review of chart and various tests results, discussions about plan of care and coordination of care plan   Heath Lark, MD 10/30/2019 9:28 AM  INTERVAL HISTORY: Please see below for problem oriented charting. She returns for further follow-up She is compliant taking medications as directed No recent infection, fever or chills She has excellent energy level She is retired  SUMMARY OF ONCOLOGIC HISTORY:  This patient used to be a blood donor. She was refused blood donation for several years due to anemia. A review of her CBC dated back to 2012 in note of mild anemia with associated thrombocytosis. In May of this year, she underwent extensive evaluation. Iron studies show that she had borderline iron deficiency. In June 2014, she had a bone marrow aspirate and biopsy. Results are inconclusive but there are presence of atypical megakaryocytes. Cytogenetics a study was normal. The  patient was started on hydroxyurea. MPL mutation, BCR/ABL and JAK2 mutation testing was negative In December 2014, she had EGD and colonoscopy for evaluation of iron deficiency anemia and the tests were negative apart from mild nonspecific gastritis. She was placed on iron supplements, subsequently discontinued in January 2015 On 01/08/2014, dose of hydroxyurea was modified to. She is instructed to take 6 days a week and skip on Sundays. Since September 2015, she reduce hydroxyurea by skipping doses on Wednesday and Sundays On 05/04/2014, I reduce hydroxyurea dose further to 500 mg 4 days a week and to add anagrelide. Due to difficulties obtaining the pills, she had not been able to take hydroxyurea and anagrelide. On 06/04/14: dose of hydroxyurea is increased to 500 mg daily. In 2019, the dose of Hydrea was increased to 500 mg daily except on Saturdays and Sundays she take 1000 mg   REVIEW OF SYSTEMS:   Constitutional: Denies fevers, chills or abnormal weight loss Eyes: Denies blurriness of vision Ears, nose, mouth, throat, and face: Denies mucositis or sore throat Respiratory: Denies cough, dyspnea or wheezes Cardiovascular: Denies palpitation, chest discomfort or lower extremity swelling Gastrointestinal:  Denies nausea, heartburn or change in bowel habits Skin: Denies abnormal skin rashes Lymphatics: Denies new lymphadenopathy or easy bruising Neurological:Denies numbness, tingling or new weaknesses Behavioral/Psych: Mood is stable, no new changes  All other systems were reviewed with the patient and are negative.  I have reviewed the past medical history, past surgical history, social history and family history with the patient and they are unchanged from previous note.  ALLERGIES:  has No Known Allergies.  MEDICATIONS:  Current Outpatient Medications  Medication  Sig Dispense Refill   Ascorbic Acid (VITAMIN C) 500 MG tablet Take 500 mg by mouth daily.       aspirin 81 MG tablet  Take 81 mg by mouth daily.       calcium-vitamin D (OSCAL) 250-125 MG-UNIT per tablet Take 1 tablet by mouth daily.       cetirizine (ZYRTEC) 10 MG tablet Take 10 mg by mouth daily as needed for allergies.      fish oil-omega-3 fatty acids 1000 MG capsule Take 1 g by mouth daily.      glipiZIDE (GLUCOTROL) 5 MG tablet TAKE 1 TABLET (5 MG TOTAL) BY MOUTH 2 (TWO) TIMES DAILY BEFORE A MEAL. 180 tablet 3   hydroxyurea (HYDREA) 500 MG capsule TAKE 1 CAPSULE BY MOUTH DAILY 90 capsule 1   metFORMIN (GLUCOPHAGE) 1000 MG tablet TAKE 1 TABLET BY MOUTH TWICE A DAY W/ A MEAL 180 tablet 3   Multiple Vitamin (MULTIVITAMIN) tablet Take 1 tablet by mouth daily.       omeprazole (PRILOSEC OTC) 20 MG tablet Take 20 mg by mouth daily.      simvastatin (ZOCOR) 10 MG tablet TAKE 1 TABLET BY MOUTH DAILY 90 tablet 3   TRULICITY 1.5 BZ/1.6RC SOPN INJECT 1.5 MG INTO THE SKIN ONCE A WEEK. 2 pen 11   No current facility-administered medications for this visit.    PHYSICAL EXAMINATION: ECOG PERFORMANCE STATUS: 0 - Asymptomatic  Vitals:   10/30/19 0904  BP: (!) 141/61  Pulse: 75  Resp: 18  Temp: 98.4 F (36.9 C)  SpO2: 100%   Filed Weights   10/30/19 0904  Weight: 155 lb 3.2 oz (70.4 kg)    GENERAL:alert, no distress and comfortable  LABORATORY DATA:  I have reviewed the data as listed    Component Value Date/Time   NA 136 12/31/2018 1122   NA 136 03/31/2013 1414   K 5.3 (H) 12/31/2018 1122   K 4.0 03/31/2013 1414   CL 100 12/31/2018 1122   CO2 29 12/31/2018 1122   CO2 24 03/31/2013 1414   GLUCOSE 154 (H) 12/31/2018 1122   GLUCOSE 178 (H) 03/31/2013 1414   BUN 8 12/31/2018 1122   BUN 8.1 03/31/2013 1414   CREATININE 0.70 12/31/2018 1122   CREATININE 0.7 03/31/2013 1414   CALCIUM 9.7 12/31/2018 1122   CALCIUM 10.1 03/31/2013 1414   PROT 7.3 12/31/2018 1122   PROT 7.2 03/31/2013 1414   ALBUMIN 4.5 12/31/2018 1122   ALBUMIN 3.9 03/31/2013 1414   AST 17 12/31/2018 1122   AST 19  03/31/2013 1414   ALT 19 12/31/2018 1122   ALT 15 03/31/2013 1414   ALKPHOS 40 12/31/2018 1122   ALKPHOS 55 03/31/2013 1414   BILITOT 0.4 12/31/2018 1122   BILITOT 0.28 03/31/2013 1414   GFRNONAA 112.68 10/29/2009 1221    No results found for: SPEP, UPEP  Lab Results  Component Value Date   WBC 5.2 10/30/2019   NEUTROABS 2.2 10/30/2019   HGB 11.0 (L) 10/30/2019   HCT 32.4 (L) 10/30/2019   MCV 104.2 (H) 10/30/2019   PLT 491 (H) 10/30/2019      Chemistry      Component Value Date/Time   NA 136 12/31/2018 1122   NA 136 03/31/2013 1414   K 5.3 (H) 12/31/2018 1122   K 4.0 03/31/2013 1414   CL 100 12/31/2018 1122   CO2 29 12/31/2018 1122   CO2 24 03/31/2013 1414   BUN 8 12/31/2018 1122   BUN 8.1  03/31/2013 1414   CREATININE 0.70 12/31/2018 1122   CREATININE 0.7 03/31/2013 1414      Component Value Date/Time   CALCIUM 9.7 12/31/2018 1122   CALCIUM 10.1 03/31/2013 1414   ALKPHOS 40 12/31/2018 1122   ALKPHOS 55 03/31/2013 1414   AST 17 12/31/2018 1122   AST 19 03/31/2013 1414   ALT 19 12/31/2018 1122   ALT 15 03/31/2013 1414   BILITOT 0.4 12/31/2018 1122   BILITOT 0.28 03/31/2013 1414

## 2019-10-30 NOTE — Telephone Encounter (Signed)
Scheduled per 6/17 sch message. Pt aware of appts on 12/17.

## 2019-11-03 ENCOUNTER — Telehealth: Payer: Medicare Other

## 2019-11-11 ENCOUNTER — Other Ambulatory Visit: Payer: Self-pay | Admitting: Internal Medicine

## 2019-11-19 ENCOUNTER — Other Ambulatory Visit: Payer: Self-pay | Admitting: Internal Medicine

## 2019-12-13 ENCOUNTER — Other Ambulatory Visit: Payer: Self-pay | Admitting: Internal Medicine

## 2020-01-02 ENCOUNTER — Encounter: Payer: Medicare Other | Admitting: Internal Medicine

## 2020-01-12 ENCOUNTER — Other Ambulatory Visit: Payer: Self-pay | Admitting: Internal Medicine

## 2020-01-13 ENCOUNTER — Encounter: Payer: Self-pay | Admitting: Internal Medicine

## 2020-01-13 ENCOUNTER — Other Ambulatory Visit: Payer: Self-pay

## 2020-01-13 ENCOUNTER — Ambulatory Visit (INDEPENDENT_AMBULATORY_CARE_PROVIDER_SITE_OTHER): Payer: Medicare Other | Admitting: Internal Medicine

## 2020-01-13 VITALS — BP 124/78 | HR 77 | Temp 98.1°F | Ht 63.75 in | Wt 153.3 lb

## 2020-01-13 DIAGNOSIS — Z0000 Encounter for general adult medical examination without abnormal findings: Secondary | ICD-10-CM | POA: Diagnosis not present

## 2020-01-13 DIAGNOSIS — Z7189 Other specified counseling: Secondary | ICD-10-CM

## 2020-01-13 DIAGNOSIS — E1159 Type 2 diabetes mellitus with other circulatory complications: Secondary | ICD-10-CM | POA: Diagnosis not present

## 2020-01-13 DIAGNOSIS — I10 Essential (primary) hypertension: Secondary | ICD-10-CM | POA: Diagnosis not present

## 2020-01-13 DIAGNOSIS — I471 Supraventricular tachycardia: Secondary | ICD-10-CM | POA: Diagnosis not present

## 2020-01-13 DIAGNOSIS — D473 Essential (hemorrhagic) thrombocythemia: Secondary | ICD-10-CM | POA: Diagnosis not present

## 2020-01-13 LAB — COMPREHENSIVE METABOLIC PANEL
ALT: 18 U/L (ref 0–35)
AST: 21 U/L (ref 0–37)
Albumin: 4.4 g/dL (ref 3.5–5.2)
Alkaline Phosphatase: 42 U/L (ref 39–117)
BUN: 10 mg/dL (ref 6–23)
CO2: 29 mEq/L (ref 19–32)
Calcium: 9.8 mg/dL (ref 8.4–10.5)
Chloride: 101 mEq/L (ref 96–112)
Creatinine, Ser: 0.75 mg/dL (ref 0.40–1.20)
GFR: 76.33 mL/min (ref >60.00)
Glucose, Bld: 72 mg/dL (ref 70–99)
Potassium: 4.7 mEq/L (ref 3.5–5.1)
Sodium: 137 mEq/L (ref 135–145)
Total Bilirubin: 0.4 mg/dL (ref 0.2–1.2)
Total Protein: 7.2 g/dL (ref 6.0–8.3)

## 2020-01-13 LAB — MICROALBUMIN / CREATININE URINE RATIO
Creatinine,U: 75.1 mg/dL
Microalb Creat Ratio: 2 mg/g (ref 0.0–30.0)
Microalb, Ur: 1.5 mg/dL (ref 0.0–1.9)

## 2020-01-13 LAB — LIPID PANEL
Cholesterol: 129 mg/dL (ref 0–200)
HDL: 60.3 mg/dL (ref >39.00)
LDL Cholesterol: 42 mg/dL (ref 0–99)
NonHDL: 68.38
Total CHOL/HDL Ratio: 2
Triglycerides: 134 mg/dL (ref 0.0–149.0)
VLDL: 26.8 mg/dL (ref 0.0–40.0)

## 2020-01-13 LAB — CBC
HCT: 32.2 % — ABNORMAL LOW (ref 36.0–46.0)
Hemoglobin: 11.4 g/dL — ABNORMAL LOW (ref 12.0–15.0)
MCHC: 35.2 g/dL (ref 30.0–36.0)
MCV: 106.8 fl — ABNORMAL HIGH (ref 78.0–100.0)
Platelets: 559 10*3/uL — ABNORMAL HIGH (ref 150.0–400.0)
RBC: 3.02 Mil/uL — ABNORMAL LOW (ref 3.87–5.11)
RDW: 12.9 % (ref 11.5–15.5)
WBC: 5.5 10*3/uL (ref 4.0–10.5)

## 2020-01-13 LAB — HM DIABETES FOOT EXAM

## 2020-01-13 LAB — HEMOGLOBIN A1C: Hgb A1c MFr Bld: 7.3 % — ABNORMAL HIGH (ref 4.6–6.5)

## 2020-01-13 NOTE — Assessment & Plan Note (Signed)
I have personally reviewed the Medicare Annual Wellness questionnaire and have noted 1. The patient's medical and social history 2. Their use of alcohol, tobacco or illicit drugs 3. Their current medications and supplements 4. The patient's functional ability including ADL's, fall risks, home safety risks and hearing or visual             impairment. 5. Diet and physical activities 6. Evidence for depression or mood disorders  The patients weight, height, BMI and visual acuity have been recorded in the chart I have made referrals, counseling and provided education to the patient based review of the above and I have provided the pt with a written personalized care plan for preventive services.  I have provided you with a copy of your personalized plan for preventive services. Please take the time to review along with your updated medication list.  COVID booster and flu vaccine this fall Td at pharmacy Consider shingrix at the pharmacy Mammogram due by 1/22---she will set up Colon due 2024 Working on fitness

## 2020-01-13 NOTE — Assessment & Plan Note (Signed)
Seems to still have good control Will check labs on glipizide, metformin and trulicity Also with HTN

## 2020-01-13 NOTE — Progress Notes (Signed)
Subjective:    Patient ID: Savannah Evans, female    DOB: [birth year], 69 y.o.   MRN: [id number]  HPI Here for Medicare wellness visit and follow up of chronic health conditions This visit occurred during the SARS-CoV-2 public health emergency.  Safety protocols were in place, including screening questions prior to the visit, additional usage of staff PPE, and extensive cleaning of exam room while observing appropriate contact time as indicated for disinfecting solutions.   Reviewed form and advanced directives Reviewed other doctors No alcohol or tobacco Walking regularly and some swimming Vision is fine--goes back in the next month or so Hearing is good No falls No depression or anhedonia Independent with instrumental ADLs No sig memory problems  Checks sugars once a month Seems to be some better No hypoglycemic reactions Weight is down slightly No foot numbness or pain Occasional right hand numbness upon arising (sounds like nerve compression)  Last check with Dr Gorsuch--platelets down some Continues hydroxyurea daily  No chest pain  No SOB No dizziness or syncope No edema No palpitations ---had felt it when she had SVT in the past  Current Outpatient Medications on File Prior to Visit  Medication Sig Dispense Refill  . Ascorbic Acid (VITAMIN C) 500 MG tablet Take 500 mg by mouth daily.      Marland Kitchen aspirin 81 MG tablet Take 81 mg by mouth daily.      . calcium-vitamin D (OSCAL) 250-125 MG-UNIT per tablet Take 1 tablet by mouth daily.      . cetirizine (ZYRTEC) 10 MG tablet Take 10 mg by mouth daily as needed for allergies.     . fish oil-omega-3 fatty acids 1000 MG capsule Take 1 g by mouth daily.     Marland Kitchen glipiZIDE (GLUCOTROL) 5 MG tablet TAKE 1 TABLET (5 MG TOTAL) BY MOUTH 2 (TWO) TIMES DAILY BEFORE A MEAL. 180 tablet 3  . hydroxyurea (HYDREA) 500 MG capsule TAKE 1 CAPSULE BY MOUTH DAILY 90 capsule 1  . metFORMIN (GLUCOPHAGE) 1000 MG tablet TAKE 1 TABLET BY MOUTH TWICE A  DAY W/ A MEAL 180 tablet 3  . Multiple Vitamin (MULTIVITAMIN) tablet Take 1 tablet by mouth daily.      Marland Kitchen omeprazole (PRILOSEC OTC) 20 MG tablet Take 20 mg by mouth daily.     . simvastatin (ZOCOR) 10 MG tablet TAKE 1 TABLET BY MOUTH DAILY 90 tablet 3  . TRULICITY 1.5 IW/5.8KD SOPN INJECT 1.5 MG INTO THE SKIN ONCE A WEEK. 2 pen 11   No current facility-administered medications on file prior to visit.    No Known Allergies  Past Medical History:  Diagnosis Date  . Allergic rhinitis   . Anemia, unspecified 03/03/2013  . Diabetes mellitus type 2, noninsulin dependent ([place])   . Diaphragmatic hernia   . Essential thrombocythemia ([place]) 10/21/2012   JAK2 neg; BCR-ABL neg; bone marrow biopsy on 09/29/12 showed normal cytogenetics.   Marland Kitchen GERD (gastroesophageal reflux disease)   . History of palpitations   . Hyperlipidemia   . Iron deficiency   . Osteoarthritis   . SVT (supraventricular tachycardia) ([facility])     Past Surgical History:  Procedure Laterality Date  . COLONOSCOPY     neg age 19.  Next age 16.   . svt ablation  2000   Dr. Caryl Comes    Family History  Problem Relation Age of Onset  . Heart attack Father   . Uterine cancer Sister   . Diabetes Sister   . Macular degeneration  Mother   . Cancer Paternal Aunt        cancer?  . Cancer Paternal Uncle        cancer?  . Colon cancer Neg Hx   . Breast cancer Neg Hx     Social History   Socioeconomic History  . Marital status: Married    Spouse name: Not on file  . Number of children: 3  . Years of education: Not on file  . Highest education level: Not on file  Occupational History  . 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 Smoking status: Never Smoker  . Smokeless tobacco: Never Used  Substance and Sexual Activity  . Alcohol use: Yes    Alcohol/week: 1.0 standard drink    Types: 1 Glasses of wine per week    Comment: social  . Drug use: No  . Sexual  activity: Not on file  Other Topics Concern  . Not on file  Social History Narrative   Has living will   Requests husband as health care POA-- alternate is daughter Colletta [place]   Would accept resuscitation attempts but no prolonged ventilation   No tube feeds if cognitively unaware   Social Determinants of Health   Financial Resource Strain:   . Difficulty of Paying Living Expenses: Not on file  Food Insecurity:   . Worried About Charity fundraiser in the Last Year: Not on file  . Ran Out of Food in the Last Year: Not on file  Transportation Needs:   . Lack of Transportation (Medical): Not on file  . Lack of Transportation (Non-Medical): Not on file  Physical Activity:   . Days of Exercise per Week: Not on file  . Minutes of Exercise per Session: Not on file  Stress:   . Feeling of Stress : Not on file  Social Connections:   . Frequency of Communication with Friends and Family: Not on file  . Frequency of Social Gatherings with Friends and Family: Not on file  . Attends Religious Services: Not on file  . Active Member of Clubs or Organizations: Not on file  . Attends Archivist Meetings: Not on file  . Marital Status: Not on file  Intimate Partner Violence:   . Fear of Current or Ex-Partner: Not on file  . Emotionally Abused: Not on file  . Physically Abused: Not on file  . Sexually Abused: Not on file   Review of Systems Appetite is good Sleeps fine Wears seat belt Teeth are fine--- needs broken crown replaced No rash or suspicious skin lesions No heartburn--or rare (like if she eats late). Tums will help. No dysphagia Bowels are fine--no fine Voids normally--no pain. No incontinence No sig back or joint pains    Objective:   Physical Exam Constitutional:      Appearance: Normal appearance.  HENT:     Mouth/Throat:     Comments: No lesions Eyes:     Conjunctiva/sclera: Conjunctivae normal.     Pupils: Pupils are equal, round, and reactive to light.    Cardiovascular:     Rate and Rhythm: Normal rate and regular rhythm.     Pulses: Normal pulses.     Heart sounds: No murmur heard.  No gallop.   Pulmonary:     Effort: Pulmonary effort is normal.     Breath sounds: Normal breath sounds. No wheezing or rales.  Abdominal:     Palpations:  Abdomen is soft.     Tenderness: There is no abdominal tenderness.  Musculoskeletal:     Cervical back: Neck supple.     Right lower leg: No edema.     Left lower leg: No edema.  Lymphadenopathy:     Cervical: No cervical adenopathy.  Skin:    General: Skin is warm.     Findings: No rash.  Neurological:     Mental Status: She is alert and oriented to person, place, and time.     Comments: President--- "Waymond Cera, Obama" [phone] [id number] Recall 3/3  Psychiatric:        Mood and Affect: Mood normal.        Behavior: Behavior normal.            Assessment & Plan:

## 2020-01-13 NOTE — Assessment & Plan Note (Signed)
No evidence of recurrence 

## 2020-01-13 NOTE — Assessment & Plan Note (Signed)
Doing well on the hydroxyurea

## 2020-01-13 NOTE — Patient Instructions (Signed)
I would recommend a tetanus booster and the shingrix vaccine at your pharmacy. Your mammogram is due by [facility] can set this up.

## 2020-01-13 NOTE — Assessment & Plan Note (Signed)
See social history 

## 2020-01-13 NOTE — Progress Notes (Signed)
Hearing Screening   125Hz  250Hz  500Hz  1000Hz  2000Hz  3000Hz  4000Hz  6000Hz  8000Hz   Right ear:   40 40 40  40    Left ear:   40 40 40  40    Vision Screening Comments: Last vision testing Oct 2020

## 2020-01-13 NOTE — Assessment & Plan Note (Signed)
BP Readings from Last 3 Encounters:  01/13/20 124/78  10/30/19 (!) 141/61  06/24/19 132/80   Still good without meds

## 2020-01-27 ENCOUNTER — Other Ambulatory Visit: Payer: Self-pay | Admitting: Internal Medicine

## 2020-01-27 DIAGNOSIS — Z1231 Encounter for screening mammogram for malignant neoplasm of breast: Secondary | ICD-10-CM

## 2020-02-13 ENCOUNTER — Other Ambulatory Visit: Payer: Self-pay

## 2020-02-13 ENCOUNTER — Ambulatory Visit
Admission: RE | Admit: 2020-02-13 | Discharge: 2020-02-13 | Disposition: A | Discharge status: Home / Self Care | Payer: Medicare Other | Source: Ambulatory Visit | Attending: Internal Medicine | Admitting: Internal Medicine

## 2020-02-13 DIAGNOSIS — Z1231 Encounter for screening mammogram for malignant neoplasm of breast: Secondary | ICD-10-CM | POA: Insufficient documentation

## 2020-03-10 ENCOUNTER — Other Ambulatory Visit: Payer: Self-pay | Admitting: Hematology and Oncology

## 2020-04-30 ENCOUNTER — Inpatient Hospital Stay: Payer: Medicare Other

## 2020-04-30 ENCOUNTER — Inpatient Hospital Stay: Payer: Medicare Other | Attending: Hematology and Oncology | Admitting: Hematology and Oncology

## 2020-04-30 ENCOUNTER — Other Ambulatory Visit: Payer: Self-pay

## 2020-04-30 ENCOUNTER — Encounter: Payer: Self-pay | Admitting: Hematology and Oncology

## 2020-04-30 DIAGNOSIS — Z7984 Long term (current) use of oral hypoglycemic drugs: Secondary | ICD-10-CM | POA: Diagnosis not present

## 2020-04-30 DIAGNOSIS — I10 Essential (primary) hypertension: Secondary | ICD-10-CM

## 2020-04-30 DIAGNOSIS — Z7982 Long term (current) use of aspirin: Secondary | ICD-10-CM | POA: Insufficient documentation

## 2020-04-30 DIAGNOSIS — Z79899 Other long term (current) drug therapy: Secondary | ICD-10-CM | POA: Insufficient documentation

## 2020-04-30 DIAGNOSIS — D649 Anemia, unspecified: Secondary | ICD-10-CM | POA: Insufficient documentation

## 2020-04-30 DIAGNOSIS — D473 Essential (hemorrhagic) thrombocythemia: Secondary | ICD-10-CM

## 2020-04-30 DIAGNOSIS — D539 Nutritional anemia, unspecified: Secondary | ICD-10-CM | POA: Diagnosis not present

## 2020-04-30 LAB — CBC WITH DIFFERENTIAL/PLATELET
Abs Immature Granulocytes: 0.03 10*3/uL (ref 0.00–0.07)
Basophils Absolute: 0 10*3/uL (ref 0.0–0.1)
Basophils Relative: 1 %
Eosinophils Absolute: 0.1 10*3/uL (ref 0.0–0.5)
Eosinophils Relative: 1 %
HCT: 33.1 % — ABNORMAL LOW (ref 36.0–46.0)
Hemoglobin: 11.3 g/dL — ABNORMAL LOW (ref 12.0–15.0)
Immature Granulocytes: 0 %
Lymphocytes Relative: 38 %
Lymphs Abs: 2.6 10*3/uL (ref 0.7–4.0)
MCH: 35.2 pg — ABNORMAL HIGH (ref 26.0–34.0)
MCHC: 34.1 g/dL (ref 30.0–36.0)
MCV: 103.1 fL — ABNORMAL HIGH (ref 80.0–100.0)
Monocytes Absolute: 0.6 10*3/uL (ref 0.1–1.0)
Monocytes Relative: 8 %
Neutro Abs: 3.5 10*3/uL (ref 1.7–7.7)
Neutrophils Relative %: 52 %
Platelets: 556 10*3/uL — ABNORMAL HIGH (ref 150–400)
RBC: 3.21 MIL/uL — ABNORMAL LOW (ref 3.87–5.11)
RDW: 11.8 % (ref 11.5–15.5)
WBC: 6.8 10*3/uL (ref 4.0–10.5)
nRBC: 0 % (ref 0.0–0.2)

## 2020-04-30 MED ORDER — HYDROXYUREA 500 MG PO CAPS: ORAL_CAPSULE | ORAL | 1 refills | Status: DC

## 2020-04-30 NOTE — Assessment & Plan Note (Signed)
It is likely there is an element of whitecoat hypertension °She will continue aggressive blood pressure management by primary care doctor °

## 2020-04-30 NOTE — Assessment & Plan Note (Signed)
The most likely cause of the deficiency anemia is due to hydroxyurea Just to be sure, I will check serum vitamin B12 and iron studies in her next visit

## 2020-04-30 NOTE — Assessment & Plan Note (Signed)
She has slight worsening thrombocytosis We agreed to modify the dose of hydroxyurea to total of 10 pills/week We also discussed the risk and benefits of anagrelide but she declined it for now I will check iron studies in her next visit to rule out borderline iron deficiency that could have contributed to worsening thrombocytosis

## 2020-04-30 NOTE — Progress Notes (Signed)
[place] OFFICE PROGRESS NOTE  Patient Care Team: [name], MD as PCP - General Barbaraann Rondo, MD as Attending Physician (Obstetrics and Gynecology) Heath Lark, MD as Consulting Physician (Hematology and Oncology) Debbora Dus, [facility] as Pharmacist (Pharmacist)  ASSESSMENT & PLAN:  Essential thrombocythemia She has slight worsening thrombocytosis We agreed to modify the dose of hydroxyurea to total of 10 pills/week We also discussed the risk and benefits of anagrelide but she declined it for now I will check iron studies in her next visit to rule out borderline iron deficiency that could have contributed to worsening thrombocytosis  Essential hypertension It is likely there is an element of whitecoat hypertension She will continue aggressive blood pressure management by primary care doctor  Deficiency anemia The most likely cause of the deficiency anemia is due to hydroxyurea Just to be sure, I will check serum vitamin B12 and iron studies in her next visit   Orders Placed This Encounter  Procedures  . Iron and TIBC    Standing Status:   Standing    Number of Occurrences:   1    Standing Expiration Date:   04/30/2021  . Vitamin B12    Standing Status:   Standing    Number of Occurrences:   1    Standing Expiration Date:   04/30/2021  . Ferritin    Standing Status:   Standing    Number of Occurrences:   1    Standing Expiration Date:   04/30/2021    All questions were answered. The patient knows to call the clinic with any problems, questions or concerns. The total time spent in the appointment was 20 minutes encounter with patients including review of chart and various tests results, discussions about plan of care and coordination of care plan   Heath Lark, MD 04/30/2020 9:29 AM  INTERVAL HISTORY: Please see below for problem oriented charting. She returns to follow-up on essential thrombocytosis She is compliant taking her medications as  directed No recent infection, fever or chills The patient denies any recent signs or symptoms of bleeding such as spontaneous epistaxis, hematuria or hematochezia.   SUMMARY OF ONCOLOGIC HISTORY:  This patient used to be a blood donor. She was refused blood donation for several years due to anemia. A review of her CBC dated back to 2012 in note of mild anemia with associated thrombocytosis. In May of this year, she underwent extensive evaluation. Iron studies show that she had borderline iron deficiency. In June 2014, she had a bone marrow aspirate and biopsy. Results are inconclusive but there are presence of atypical megakaryocytes. Cytogenetics a study was normal. The patient was started on hydroxyurea. MPL mutation, BCR/ABL and JAK2 mutation testing was negative In December 2014, she had EGD and colonoscopy for evaluation of iron deficiency anemia and the tests were negative apart from mild nonspecific gastritis. She was placed on iron supplements, subsequently discontinued in January 2015 On 01/08/2014, dose of hydroxyurea was modified to. She is instructed to take 6 days a week and skip on Sundays. Since September 2015, she reduce hydroxyurea by skipping doses on Wednesday and Sundays On 05/04/2014, I reduce hydroxyurea dose further to 500 mg 4 days a week and to add anagrelide. Due to difficulties obtaining the pills, she had not been able to take hydroxyurea and anagrelide. On 06/04/14: dose of hydroxyurea is increased to 500 mg daily. In 2019, the dose of Hydrea was increased to 500 mg daily except on Saturdays and  Sundays she take 1000 mg On April 30, 2020, the dose of hydroxyurea is increased to 1000 mg on Mondays, Wednesdays and Fridays and to take 500 mg for the rest of the week  REVIEW OF SYSTEMS:   Constitutional: Denies fevers, chills or abnormal weight loss Eyes: Denies blurriness of vision Ears, nose, mouth, throat, and face: Denies mucositis or sore throat Respiratory:  Denies cough, dyspnea or wheezes Cardiovascular: Denies palpitation, chest discomfort or lower extremity swelling Gastrointestinal:  Denies nausea, heartburn or change in bowel habits Skin: Denies abnormal skin rashes Lymphatics: Denies new lymphadenopathy or easy bruising Neurological:Denies numbness, tingling or new weaknesses Behavioral/Psych: Mood is stable, no new changes  All other systems were reviewed with the patient and are negative.  I have reviewed the past medical history, past surgical history, social history and family history with the patient and they are unchanged from previous note.  ALLERGIES:  has No Known Allergies.  MEDICATIONS:  Current Outpatient Medications  Medication Sig Dispense Refill  . Ascorbic Acid (VITAMIN C) 500 MG tablet Take 500 mg by mouth daily.      Marland Kitchen aspirin 81 MG tablet Take 81 mg by mouth daily.      . calcium-vitamin D (OSCAL) 250-125 MG-UNIT per tablet Take 1 tablet by mouth daily.      . cetirizine (ZYRTEC) 10 MG tablet Take 10 mg by mouth daily as needed for allergies.     . fish oil-omega-3 fatty acids 1000 MG capsule Take 1 g by mouth daily.     Marland Kitchen glipiZIDE (GLUCOTROL) 5 MG tablet TAKE 1 TABLET (5 MG TOTAL) BY MOUTH 2 (TWO) TIMES DAILY BEFORE A MEAL. 180 tablet 3  . hydroxyurea (HYDREA) 500 MG capsule Take 2 pills on Mondays, Wednesdays and Fridays, and 1 pill the rest of the week 120 capsule 1  . metFORMIN (GLUCOPHAGE) 1000 MG tablet TAKE 1 TABLET BY MOUTH TWICE A DAY W/ A MEAL 180 tablet 3  . Multiple Vitamin (MULTIVITAMIN) tablet Take 1 tablet by mouth daily.      Marland Kitchen omeprazole (PRILOSEC OTC) 20 MG tablet Take 20 mg by mouth daily.     . simvastatin (ZOCOR) 10 MG tablet TAKE 1 TABLET BY MOUTH DAILY 90 tablet 3  . TRULICITY 1.5 FE/0.7HQ SOPN INJECT 1.5 MG INTO THE SKIN ONCE A WEEK. 2 pen 11   No current facility-administered medications for this visit.    PHYSICAL EXAMINATION: ECOG PERFORMANCE STATUS: 1 - Symptomatic but completely  ambulatory  Vitals:   04/30/20 0852  BP: (!) 145/58  Pulse: 91  Resp: 18  Temp: (!) 97.5 F (36.4 C)  SpO2: 97%   Filed Weights   04/30/20 0852  Weight: 155 lb 12.8 oz (70.7 kg)    GENERAL:alert, no distress and comfortable NEURO: alert & oriented x 3 with fluent speech, no focal motor/sensory deficits  LABORATORY DATA:  I have reviewed the data as listed    Component Value Date/Time   NA 137 01/13/2020 1113   NA 136 03/31/2013 1414   K 4.7 01/13/2020 1113   K 4.0 03/31/2013 1414   CL 101 01/13/2020 1113   CO2 29 01/13/2020 1113   CO2 24 03/31/2013 1414   GLUCOSE 72 01/13/2020 1113   GLUCOSE 178 (H) 03/31/2013 1414   BUN 10 01/13/2020 1113   BUN 8.1 03/31/2013 1414   CREATININE 0.75 01/13/2020 1113   CREATININE 0.7 03/31/2013 1414   CALCIUM 9.8 01/13/2020 1113   CALCIUM 10.1 03/31/2013 1414   PROT  7.2 01/13/2020 1113   PROT 7.2 03/31/2013 1414   ALBUMIN 4.4 01/13/2020 1113   ALBUMIN 3.9 03/31/2013 1414   AST 21 01/13/2020 1113   AST 19 03/31/2013 1414   ALT 18 01/13/2020 1113   ALT 15 03/31/2013 1414   ALKPHOS 42 01/13/2020 1113   ALKPHOS 55 03/31/2013 1414   BILITOT 0.4 01/13/2020 1113   BILITOT 0.28 03/31/2013 1414   GFRNONAA 112.68 10/29/2009 1221    No results found for: SPEP, UPEP  Lab Results  Component Value Date   WBC 6.8 04/30/2020   NEUTROABS 3.5 04/30/2020   HGB 11.3 (L) 04/30/2020   HCT 33.1 (L) 04/30/2020   MCV 103.1 (H) 04/30/2020   PLT 556 (H) 04/30/2020      Chemistry      Component Value Date/Time   NA 137 01/13/2020 1113   NA 136 03/31/2013 1414   K 4.7 01/13/2020 1113   K 4.0 03/31/2013 1414   CL 101 01/13/2020 1113   CO2 29 01/13/2020 1113   CO2 24 03/31/2013 1414   BUN 10 01/13/2020 1113   BUN 8.1 03/31/2013 1414   CREATININE 0.75 01/13/2020 1113   CREATININE 0.7 03/31/2013 1414      Component Value Date/Time   CALCIUM 9.8 01/13/2020 1113   CALCIUM 10.1 03/31/2013 1414   ALKPHOS 42 01/13/2020 1113   ALKPHOS 55  03/31/2013 1414   AST 21 01/13/2020 1113   AST 19 03/31/2013 1414   ALT 18 01/13/2020 1113   ALT 15 03/31/2013 1414   BILITOT 0.4 01/13/2020 1113   BILITOT 0.28 03/31/2013 1414

## 2020-08-31 ENCOUNTER — Inpatient Hospital Stay: Payer: Medicare Other

## 2020-08-31 ENCOUNTER — Other Ambulatory Visit: Payer: Self-pay

## 2020-08-31 ENCOUNTER — Telehealth: Payer: Self-pay

## 2020-08-31 ENCOUNTER — Encounter: Payer: Self-pay | Admitting: Hematology and Oncology

## 2020-08-31 ENCOUNTER — Telehealth: Payer: Self-pay | Admitting: Hematology and Oncology

## 2020-08-31 ENCOUNTER — Inpatient Hospital Stay: Payer: Medicare Other | Attending: Hematology and Oncology | Admitting: Hematology and Oncology

## 2020-08-31 ENCOUNTER — Other Ambulatory Visit: Payer: Self-pay | Admitting: Hematology and Oncology

## 2020-08-31 DIAGNOSIS — D539 Nutritional anemia, unspecified: Secondary | ICD-10-CM | POA: Diagnosis not present

## 2020-08-31 DIAGNOSIS — Z7982 Long term (current) use of aspirin: Secondary | ICD-10-CM | POA: Diagnosis not present

## 2020-08-31 DIAGNOSIS — D649 Anemia, unspecified: Secondary | ICD-10-CM | POA: Insufficient documentation

## 2020-08-31 DIAGNOSIS — Z7984 Long term (current) use of oral hypoglycemic drugs: Secondary | ICD-10-CM | POA: Insufficient documentation

## 2020-08-31 DIAGNOSIS — D473 Essential (hemorrhagic) thrombocythemia: Secondary | ICD-10-CM | POA: Insufficient documentation

## 2020-08-31 DIAGNOSIS — I10 Essential (primary) hypertension: Secondary | ICD-10-CM

## 2020-08-31 DIAGNOSIS — Z79899 Other long term (current) drug therapy: Secondary | ICD-10-CM | POA: Insufficient documentation

## 2020-08-31 LAB — CBC WITH DIFFERENTIAL/PLATELET
Abs Immature Granulocytes: 0.02 10*3/uL (ref 0.00–0.07)
Basophils Absolute: 0 10*3/uL (ref 0.0–0.1)
Basophils Relative: 1 %
Eosinophils Absolute: 0.1 10*3/uL (ref 0.0–0.5)
Eosinophils Relative: 1 %
HCT: 32.7 % — ABNORMAL LOW (ref 36.0–46.0)
Hemoglobin: 11.3 g/dL — ABNORMAL LOW (ref 12.0–15.0)
Immature Granulocytes: 0 %
Lymphocytes Relative: 31 %
Lymphs Abs: 1.8 10*3/uL (ref 0.7–4.0)
MCH: 36.2 pg — ABNORMAL HIGH (ref 26.0–34.0)
MCHC: 34.6 g/dL (ref 30.0–36.0)
MCV: 104.8 fL — ABNORMAL HIGH (ref 80.0–100.0)
Monocytes Absolute: 0.6 10*3/uL (ref 0.1–1.0)
Monocytes Relative: 10 %
Neutro Abs: 3.2 10*3/uL (ref 1.7–7.7)
Neutrophils Relative %: 57 %
Platelets: 448 10*3/uL — ABNORMAL HIGH (ref 150–400)
RBC: 3.12 MIL/uL — ABNORMAL LOW (ref 3.87–5.11)
RDW: 12.4 % (ref 11.5–15.5)
WBC: 5.7 10*3/uL (ref 4.0–10.5)
nRBC: 0 % (ref 0.0–0.2)

## 2020-08-31 LAB — IRON AND TIBC
Iron: 60 ug/dL (ref 41–142)
Saturation Ratios: 16 % — ABNORMAL LOW (ref 21–57)
TIBC: 381 ug/dL (ref 236–444)
UIBC: 321 ug/dL (ref 120–384)

## 2020-08-31 LAB — VITAMIN B12: Vitamin B-12: 2406 pg/mL — ABNORMAL HIGH (ref 180–914)

## 2020-08-31 LAB — FERRITIN: Ferritin: 37 ng/mL (ref 11–307)

## 2020-08-31 NOTE — Assessment & Plan Note (Signed)
Her platelet count is well controlled She is noted to have borderline iron deficiency I recommend very low-dose iron supplement every other day for the next few months I do not plan to change the dose of hydroxyurea

## 2020-08-31 NOTE — Telephone Encounter (Signed)
-----   Message from Heath Lark, MD sent at 08/31/2020 11:57 AM EDT ----- Pls call and let her know B12 is high but iron came back borderline low I recommend she gets OTC iron supplement, no specific brand or dose, take 1 tab every other day at bedtime

## 2020-08-31 NOTE — Telephone Encounter (Signed)
Scheduled appt per 4/19 sch msg. Pt aware.  

## 2020-08-31 NOTE — Assessment & Plan Note (Signed)
She has borderline iron deficiency that could be contributing to her anemia, in addition to anemia chronic illness secondary to diabetes and hydroxyurea I recommend a very low-dose oral iron She does not need B12 supplement

## 2020-08-31 NOTE — Telephone Encounter (Signed)
Called and left below message. Ask her to call the office back for questions. 

## 2020-08-31 NOTE — Progress Notes (Signed)
[place] OFFICE PROGRESS NOTE  Patient Care Team: [name], MD as PCP - General Barbaraann Rondo, MD as Attending Physician (Obstetrics and Gynecology) Heath Lark, MD as Consulting Physician (Hematology and Oncology) Debbora Dus, [facility] as Pharmacist (Pharmacist)  ASSESSMENT & PLAN:  Essential thrombocythemia Her platelet count is well controlled She is noted to have borderline iron deficiency I recommend very low-dose iron supplement every other day for the next few months I do not plan to change the dose of hydroxyurea  Deficiency anemia She has borderline iron deficiency that could be contributing to her anemia, in addition to anemia chronic illness secondary to diabetes and hydroxyurea I recommend a very low-dose oral iron She does not need B12 supplement   No orders of the defined types were placed in this encounter.   All questions were answered. The patient knows to call the clinic with any problems, questions or concerns. The total time spent in the appointment was 20 minutes encounter with patients including review of chart and various tests results, discussions about plan of care and coordination of care plan   Heath Lark, MD 08/31/2020 11:56 AM  INTERVAL HISTORY: Please see below for problem oriented charting. She returns for further follow-up She is doing well The patient denies any recent signs or symptoms of bleeding such as spontaneous epistaxis, hematuria or hematochezia. She is compliant taking her medications as directed and denies missing doses  SUMMARY OF ONCOLOGIC HISTORY:  This patient used to be a blood donor. She was refused blood donation for several years due to anemia. A review of her CBC dated back to 2012 in note of mild anemia with associated thrombocytosis. In May of this year, she underwent extensive evaluation. Iron studies show that she had borderline iron deficiency. In June 2014, she had a bone marrow aspirate and  biopsy. Results are inconclusive but there are presence of atypical megakaryocytes. Cytogenetics a study was normal. The patient was started on hydroxyurea. MPL mutation, BCR/ABL and JAK2 mutation testing was negative In December 2014, she had EGD and colonoscopy for evaluation of iron deficiency anemia and the tests were negative apart from mild nonspecific gastritis. She was placed on iron supplements, subsequently discontinued in January 2015 On 01/08/2014, dose of hydroxyurea was modified to. She is instructed to take 6 days a week and skip on Sundays. Since September 2015, she reduce hydroxyurea by skipping doses on Wednesday and Sundays On 05/04/2014, I reduce hydroxyurea dose further to 500 mg 4 days a week and to add anagrelide. Due to difficulties obtaining the pills, she had not been able to take hydroxyurea and anagrelide. On 06/04/14: dose of hydroxyurea is increased to 500 mg daily. In 2019, the dose of Hydrea was increased to 500 mg daily except on Saturdays and Sundays she take 1000 mg On April 30, 2020, the dose of hydroxyurea is increased to 1000 mg on Mondays, Wednesdays and Fridays and to take 500 mg for the rest of the week  REVIEW OF SYSTEMS:   Constitutional: Denies fevers, chills or abnormal weight loss Eyes: Denies blurriness of vision Ears, nose, mouth, throat, and face: Denies mucositis or sore throat Respiratory: Denies cough, dyspnea or wheezes Cardiovascular: Denies palpitation, chest discomfort or lower extremity swelling Gastrointestinal:  Denies nausea, heartburn or change in bowel habits Skin: Denies abnormal skin rashes Lymphatics: Denies new lymphadenopathy or easy bruising Neurological:Denies numbness, tingling or new weaknesses Behavioral/Psych: Mood is stable, no new changes  All other systems were reviewed with  the patient and are negative.  I have reviewed the past medical history, past surgical history, social history and family history with the  patient and they are unchanged from previous note.  ALLERGIES:  has No Known Allergies.  MEDICATIONS:  Current Outpatient Medications  Medication Sig Dispense Refill  . Ascorbic Acid (VITAMIN C) 500 MG tablet Take 500 mg by mouth daily.      Marland Kitchen aspirin 81 MG tablet Take 81 mg by mouth daily.      . calcium-vitamin D (OSCAL) 250-125 MG-UNIT per tablet Take 1 tablet by mouth daily.      . cetirizine (ZYRTEC) 10 MG tablet Take 10 mg by mouth daily as needed for allergies.     . fish oil-omega-3 fatty acids 1000 MG capsule Take 1 g by mouth daily.     Marland Kitchen glipiZIDE (GLUCOTROL) 5 MG tablet TAKE 1 TABLET (5 MG TOTAL) BY MOUTH 2 (TWO) TIMES DAILY BEFORE A MEAL. 180 tablet 3  . hydroxyurea (HYDREA) 500 MG capsule Take 2 pills on Mondays, Wednesdays and Fridays, and 1 pill the rest of the week 120 capsule 1  . metFORMIN (GLUCOPHAGE) 1000 MG tablet TAKE 1 TABLET BY MOUTH TWICE A DAY W/ A MEAL 180 tablet 3  . Multiple Vitamin (MULTIVITAMIN) tablet Take 1 tablet by mouth daily.      Marland Kitchen omeprazole (PRILOSEC OTC) 20 MG tablet Take 20 mg by mouth daily.     . simvastatin (ZOCOR) 10 MG tablet TAKE 1 TABLET BY MOUTH DAILY 90 tablet 3  . TRULICITY 1.5 ZO/1.0RU SOPN INJECT 1.5 MG INTO THE SKIN ONCE A WEEK. 2 pen 11   No current facility-administered medications for this visit.    PHYSICAL EXAMINATION: ECOG PERFORMANCE STATUS: 0 - Asymptomatic  Vitals:   08/31/20 0851  BP: 139/64  Pulse: 94  Resp: 18  Temp: 97.9 F (36.6 C)  SpO2: 100%   Filed Weights   08/31/20 0851  Weight: 155 lb 9.6 oz (70.6 kg)    GENERAL:alert, no distress and comfortable NEURO: alert & oriented x 3 with fluent speech, no focal motor/sensory deficits  LABORATORY DATA:  I have reviewed the data as listed    Component Value Date/Time   NA 137 01/13/2020 1113   NA 136 03/31/2013 1414   K 4.7 01/13/2020 1113   K 4.0 03/31/2013 1414   CL 101 01/13/2020 1113   CO2 29 01/13/2020 1113   CO2 24 03/31/2013 1414   GLUCOSE 72  01/13/2020 1113   GLUCOSE 178 (H) 03/31/2013 1414   BUN 10 01/13/2020 1113   BUN 8.1 03/31/2013 1414   CREATININE 0.75 01/13/2020 1113   CREATININE 0.7 03/31/2013 1414   CALCIUM 9.8 01/13/2020 1113   CALCIUM 10.1 03/31/2013 1414   PROT 7.2 01/13/2020 1113   PROT 7.2 03/31/2013 1414   ALBUMIN 4.4 01/13/2020 1113   ALBUMIN 3.9 03/31/2013 1414   AST 21 01/13/2020 1113   AST 19 03/31/2013 1414   ALT 18 01/13/2020 1113   ALT 15 03/31/2013 1414   ALKPHOS 42 01/13/2020 1113   ALKPHOS 55 03/31/2013 1414   BILITOT 0.4 01/13/2020 1113   BILITOT 0.28 03/31/2013 1414   GFRNONAA 112.68 10/29/2009 1221    No results found for: SPEP, UPEP  Lab Results  Component Value Date   WBC 5.7 08/31/2020   NEUTROABS 3.2 08/31/2020   HGB 11.3 (L) 08/31/2020   HCT 32.7 (L) 08/31/2020   MCV 104.8 (H) 08/31/2020   PLT 448 (H) 08/31/2020  Chemistry      Component Value Date/Time   NA 137 01/13/2020 1113   NA 136 03/31/2013 1414   K 4.7 01/13/2020 1113   K 4.0 03/31/2013 1414   CL 101 01/13/2020 1113   CO2 29 01/13/2020 1113   CO2 24 03/31/2013 1414   BUN 10 01/13/2020 1113   BUN 8.1 03/31/2013 1414   CREATININE 0.75 01/13/2020 1113   CREATININE 0.7 03/31/2013 1414      Component Value Date/Time   CALCIUM 9.8 01/13/2020 1113   CALCIUM 10.1 03/31/2013 1414   ALKPHOS 42 01/13/2020 1113   ALKPHOS 55 03/31/2013 1414   AST 21 01/13/2020 1113   AST 19 03/31/2013 1414   ALT 18 01/13/2020 1113   ALT 15 03/31/2013 1414   BILITOT 0.4 01/13/2020 1113   BILITOT 0.28 03/31/2013 1414

## 2020-10-07 ENCOUNTER — Other Ambulatory Visit: Payer: Self-pay | Admitting: Internal Medicine

## 2020-11-10 ENCOUNTER — Other Ambulatory Visit: Payer: Self-pay | Admitting: Internal Medicine

## 2020-12-07 ENCOUNTER — Other Ambulatory Visit: Payer: Self-pay | Admitting: Internal Medicine

## 2021-01-04 ENCOUNTER — Other Ambulatory Visit: Payer: Self-pay | Admitting: Internal Medicine

## 2021-01-04 ENCOUNTER — Inpatient Hospital Stay (HOSPITAL_BASED_OUTPATIENT_CLINIC_OR_DEPARTMENT_OTHER): Payer: Medicare Other | Admitting: Hematology and Oncology

## 2021-01-04 ENCOUNTER — Other Ambulatory Visit: Payer: Self-pay

## 2021-01-04 ENCOUNTER — Encounter: Payer: Self-pay | Admitting: Hematology and Oncology

## 2021-01-04 ENCOUNTER — Inpatient Hospital Stay: Payer: Medicare Other | Attending: Hematology and Oncology

## 2021-01-04 VITALS — BP 146/70 | HR 75 | Temp 98.0°F | Resp 18 | Ht 63.75 in | Wt 152.6 lb

## 2021-01-04 DIAGNOSIS — Z7984 Long term (current) use of oral hypoglycemic drugs: Secondary | ICD-10-CM | POA: Insufficient documentation

## 2021-01-04 DIAGNOSIS — R634 Abnormal weight loss: Secondary | ICD-10-CM | POA: Insufficient documentation

## 2021-01-04 DIAGNOSIS — D473 Essential (hemorrhagic) thrombocythemia: Secondary | ICD-10-CM | POA: Insufficient documentation

## 2021-01-04 DIAGNOSIS — E1159 Type 2 diabetes mellitus with other circulatory complications: Secondary | ICD-10-CM

## 2021-01-04 DIAGNOSIS — D509 Iron deficiency anemia, unspecified: Secondary | ICD-10-CM | POA: Diagnosis not present

## 2021-01-04 DIAGNOSIS — Z7982 Long term (current) use of aspirin: Secondary | ICD-10-CM | POA: Insufficient documentation

## 2021-01-04 DIAGNOSIS — D539 Nutritional anemia, unspecified: Secondary | ICD-10-CM

## 2021-01-04 LAB — CBC WITH DIFFERENTIAL/PLATELET
Abs Immature Granulocytes: 0.01 10*3/uL (ref 0.00–0.07)
Basophils Absolute: 0 10*3/uL (ref 0.0–0.1)
Basophils Relative: 1 %
Eosinophils Absolute: 0.1 10*3/uL (ref 0.0–0.5)
Eosinophils Relative: 1 %
HCT: 31.2 % — ABNORMAL LOW (ref 36.0–46.0)
Hemoglobin: 11 g/dL — ABNORMAL LOW (ref 12.0–15.0)
Immature Granulocytes: 0 %
Lymphocytes Relative: 45 %
Lymphs Abs: 2 10*3/uL (ref 0.7–4.0)
MCH: 37 pg — ABNORMAL HIGH (ref 26.0–34.0)
MCHC: 35.3 g/dL (ref 30.0–36.0)
MCV: 105.1 fL — ABNORMAL HIGH (ref 80.0–100.0)
Monocytes Absolute: 0.4 10*3/uL (ref 0.1–1.0)
Monocytes Relative: 9 %
Neutro Abs: 1.9 10*3/uL (ref 1.7–7.7)
Neutrophils Relative %: 44 %
Platelets: 418 10*3/uL — ABNORMAL HIGH (ref 150–400)
RBC: 2.97 MIL/uL — ABNORMAL LOW (ref 3.87–5.11)
RDW: 11.6 % (ref 11.5–15.5)
WBC: 4.4 10*3/uL (ref 4.0–10.5)
nRBC: 0 % (ref 0.0–0.2)

## 2021-01-04 LAB — FERRITIN: Ferritin: 35 ng/mL (ref 11–307)

## 2021-01-04 LAB — IRON AND TIBC
Iron: 91 ug/dL (ref 41–142)
Saturation Ratios: 25 % (ref 21–57)
TIBC: 360 ug/dL (ref 236–444)
UIBC: 269 ug/dL (ref 120–384)

## 2021-01-04 MED ORDER — HYDROXYUREA 500 MG PO CAPS: ORAL_CAPSULE | ORAL | 1 refills | Status: DC

## 2021-01-04 NOTE — Progress Notes (Signed)
[place] OFFICE PROGRESS NOTE  Patient Care Team: [name], MD as PCP - General Barbaraann Rondo, MD as Attending Physician (Obstetrics and Gynecology) Heath Lark, MD as Consulting Physician (Hematology and Oncology) Debbora Dus, [facility] as Pharmacist (Pharmacist)  ASSESSMENT & PLAN:  Essential thrombocythemia Her platelet count is well controlled Her anemia is stable I do not plan to change the dose of hydroxyurea She will also continue taking aspirin  Deficiency anemia She has borderline iron deficiency that could be contributing to her anemia, in addition to anemia chronic illness secondary to diabetes and hydroxyurea I recommend a very low-dose oral iron which she takes every other day and she tolerated that very well She does not need B12 supplement  Type 2 diabetes mellitus with other circulatory complications ([place]) She has lost some weight recently I continue to encourage the patient with weight loss and dietary modification I am hopeful that will improve her diabetes control  Orders Placed This Encounter  Procedures   CBC with Differential/Platelet    Standing Status:   Standing    Number of Occurrences:   22    Standing Expiration Date:   01/04/2022    All questions were answered. The patient knows to call the clinic with any problems, questions or concerns. The total time spent in the appointment was 20 minutes encounter with patients including review of chart and various tests results, discussions about plan of care and coordination of care plan   Heath Lark, MD 01/04/2021 9:55 AM  INTERVAL HISTORY:  Please see below for problem oriented charting. She returns for further follow-up She is doing very well She has lost some weight through dietary modification and being active Denies recent bleeding or infection She denies missing doses She tolerated oral iron supplement well without significant side effects  SUMMARY OF ONCOLOGIC  HISTORY: Oncology History  Essential thrombocythemia ([place])  10/11/2012 Initial Diagnosis   She was evaluated for abnormal CBC   10/14/2012 Bone Marrow Biopsy   Bone Marrow, Aspirate,Biopsy, and Clot, right iliac - VARIABLY CELLULAR BONE MARROW FOR AGE WITH ATYPICAL MEGAKARYOCYTIC PROLIFERATION - SEE COMMENT. PERIPHERAL BLOOD: - NORMOCYTIC-NORMOCHROMIC ANEMIA. - THROMBOCYTOSIS. Diagnosis Note The bone marrow material is limited with lack of aspirate or adequate touch imprints for cytomorphologic evaluation. Nonetheless, there is an atypical megakaryocytic proliferation in the core biopsy worrisome for a myeloproliferative neoplasm (chronic myeloproliferative disorder) particularly essential thrombocythemia. Correlation with cytogenetic studies is recommended. BCR/ABl is neg   10/21/2012 Initial Diagnosis   Essential thrombocythemia ([place])   12/13/2012 -  Chemotherapy   On 01/08/2014, dose of hydroxyurea was modified to take Hydrea,500 mg 6 days a week and skip on Sundays. Since September 2015, she reduce hydroxyurea by skipping doses on Wednesday and Sundays On 05/04/2014, I reduce hydroxyurea dose further to 500 mg 4 days a week and to add anagrelide. Due to difficulties obtaining the pills, she had not been able to take hydroxyurea and anagrelide. On 06/04/14: dose of hydroxyurea is increased to 500 mg daily. In 2019, the dose of Hydrea was increased to 500 mg daily except on Saturdays and Sundays she take 1000 mg On April 30, 2020, the dose of hydroxyurea is increased to 1000 mg on Mondays, Wednesdays and Fridays and to take 500 mg for the rest of the week      REVIEW OF SYSTEMS:   Constitutional: Denies fevers, chills or abnormal weight loss Eyes: Denies blurriness of vision Ears, nose, mouth, throat, and face: Denies mucositis or  sore throat Respiratory: Denies cough, dyspnea or wheezes Cardiovascular: Denies palpitation, chest discomfort or lower extremity  swelling Gastrointestinal:  Denies nausea, heartburn or change in bowel habits Skin: Denies abnormal skin rashes Lymphatics: Denies new lymphadenopathy or easy bruising Neurological:Denies numbness, tingling or new weaknesses Behavioral/Psych: Mood is stable, no new changes  All other systems were reviewed with the patient and are negative.  I have reviewed the past medical history, past surgical history, social history and family history with the patient and they are unchanged from previous note.  ALLERGIES:  has No Known Allergies.  MEDICATIONS:  Current Outpatient Medications  Medication Sig Dispense Refill   ferrous sulfate 325 (65 FE) MG tablet Take 325 mg by mouth daily with breakfast.     Ascorbic Acid (VITAMIN C) 500 MG tablet Take 500 mg by mouth daily.       aspirin 81 MG tablet Take 81 mg by mouth daily.       calcium-vitamin D (OSCAL) 250-125 MG-UNIT per tablet Take 1 tablet by mouth daily.       cetirizine (ZYRTEC) 10 MG tablet Take 10 mg by mouth daily as needed for allergies.      fish oil-omega-3 fatty acids 1000 MG capsule Take 1 g by mouth daily.      glipiZIDE (GLUCOTROL) 5 MG tablet TAKE 1 TABLET (5 MG TOTAL) BY MOUTH 2 (TWO) TIMES DAILY BEFORE A MEAL. 180 tablet 0   hydroxyurea (HYDREA) 500 MG capsule Take 2 capsules on Mondays, Wednesdays and Fridays, 1 capsule rest of the week 100 capsule 1   metFORMIN (GLUCOPHAGE) 1000 MG tablet TAKE 1 TABLET BY MOUTH TWICE A DAY W/ A MEAL 180 tablet 3   Multiple Vitamin (MULTIVITAMIN) tablet Take 1 tablet by mouth daily.       omeprazole (PRILOSEC OTC) 20 MG tablet Take 20 mg by mouth daily.      simvastatin (ZOCOR) 10 MG tablet TAKE 1 TABLET BY MOUTH DAILY 90 tablet 3   TRULICITY 1.5 YD/7.4JO SOPN INJECT 1.5 MG INTO THE SKIN ONCE A WEEK. 2 mL 5   No current facility-administered medications for this visit.    PHYSICAL EXAMINATION: ECOG PERFORMANCE STATUS: 0 - Asymptomatic  Vitals:   01/04/21 0850  BP: (!) 146/70  Pulse:  75  Resp: 18  Temp: 98 F (36.7 C)  SpO2: 99%   Filed Weights   01/04/21 0850  Weight: 152 lb 9.6 oz (69.2 kg)    GENERAL:alert, no distress and comfortable  NEURO: alert & oriented x 3 with fluent speech, no focal motor/sensory deficits  LABORATORY DATA:  I have reviewed the data as listed    Component Value Date/Time   NA 137 01/13/2020 1113   NA 136 03/31/2013 1414   K 4.7 01/13/2020 1113   K 4.0 03/31/2013 1414   CL 101 01/13/2020 1113   CO2 29 01/13/2020 1113   CO2 24 03/31/2013 1414   GLUCOSE 72 01/13/2020 1113   GLUCOSE 178 (H) 03/31/2013 1414   BUN 10 01/13/2020 1113   BUN 8.1 03/31/2013 1414   CREATININE 0.75 01/13/2020 1113   CREATININE 0.7 03/31/2013 1414   CALCIUM 9.8 01/13/2020 1113   CALCIUM 10.1 03/31/2013 1414   PROT 7.2 01/13/2020 1113   PROT 7.2 03/31/2013 1414   ALBUMIN 4.4 01/13/2020 1113   ALBUMIN 3.9 03/31/2013 1414   AST 21 01/13/2020 1113   AST 19 03/31/2013 1414   ALT 18 01/13/2020 1113   ALT 15 03/31/2013 1414   ALKPHOS  42 01/13/2020 1113   ALKPHOS 55 03/31/2013 1414   BILITOT 0.4 01/13/2020 1113   BILITOT 0.28 03/31/2013 1414   GFRNONAA 112.68 10/29/2009 1221    No results found for: SPEP, UPEP  Lab Results  Component Value Date   WBC 4.4 01/04/2021   NEUTROABS 1.9 01/04/2021   HGB 11.0 (L) 01/04/2021   HCT 31.2 (L) 01/04/2021   MCV 105.1 (H) 01/04/2021   PLT 418 (H) 01/04/2021      Chemistry      Component Value Date/Time   NA 137 01/13/2020 1113   NA 136 03/31/2013 1414   K 4.7 01/13/2020 1113   K 4.0 03/31/2013 1414   CL 101 01/13/2020 1113   CO2 29 01/13/2020 1113   CO2 24 03/31/2013 1414   BUN 10 01/13/2020 1113   BUN 8.1 03/31/2013 1414   CREATININE 0.75 01/13/2020 1113   CREATININE 0.7 03/31/2013 1414      Component Value Date/Time   CALCIUM 9.8 01/13/2020 1113   CALCIUM 10.1 03/31/2013 1414   ALKPHOS 42 01/13/2020 1113   ALKPHOS 55 03/31/2013 1414   AST 21 01/13/2020 1113   AST 19 03/31/2013 1414    ALT 18 01/13/2020 1113   ALT 15 03/31/2013 1414   BILITOT 0.4 01/13/2020 1113   BILITOT 0.28 03/31/2013 1414

## 2021-01-04 NOTE — Assessment & Plan Note (Signed)
She has lost some weight recently I continue to encourage the patient with weight loss and dietary modification I am hopeful that will improve her diabetes control

## 2021-01-04 NOTE — Assessment & Plan Note (Signed)
She has borderline iron deficiency that could be contributing to her anemia, in addition to anemia chronic illness secondary to diabetes and hydroxyurea I recommend a very low-dose oral iron which she takes every other day and she tolerated that very well She does not need B12 supplement 

## 2021-01-04 NOTE — Assessment & Plan Note (Signed)
Her platelet count is well controlled Her anemia is stable I do not plan to change the dose of hydroxyurea She will also continue taking aspirin

## 2021-01-05 ENCOUNTER — Telehealth: Payer: Self-pay | Admitting: Hematology and Oncology

## 2021-01-05 NOTE — Telephone Encounter (Signed)
Scheduled per 8/23 sch msg. Called pt and left a msg  

## 2021-01-14 ENCOUNTER — Other Ambulatory Visit: Payer: Self-pay

## 2021-01-14 ENCOUNTER — Ambulatory Visit (INDEPENDENT_AMBULATORY_CARE_PROVIDER_SITE_OTHER): Payer: Medicare Other | Admitting: Internal Medicine

## 2021-01-14 ENCOUNTER — Encounter: Payer: Self-pay | Admitting: Internal Medicine

## 2021-01-14 VITALS — BP 144/86 | HR 78 | Temp 97.9°F | Ht 63.75 in | Wt 152.0 lb

## 2021-01-14 DIAGNOSIS — I471 Supraventricular tachycardia, unspecified: Secondary | ICD-10-CM

## 2021-01-14 DIAGNOSIS — K219 Gastro-esophageal reflux disease without esophagitis: Secondary | ICD-10-CM | POA: Diagnosis not present

## 2021-01-14 DIAGNOSIS — E1159 Type 2 diabetes mellitus with other circulatory complications: Secondary | ICD-10-CM

## 2021-01-14 DIAGNOSIS — Z0000 Encounter for general adult medical examination without abnormal findings: Secondary | ICD-10-CM | POA: Diagnosis not present

## 2021-01-14 DIAGNOSIS — D473 Essential (hemorrhagic) thrombocythemia: Secondary | ICD-10-CM | POA: Diagnosis not present

## 2021-01-14 DIAGNOSIS — Z7189 Other specified counseling: Secondary | ICD-10-CM | POA: Diagnosis not present

## 2021-01-14 LAB — COMPREHENSIVE METABOLIC PANEL WITH GFR
ALT: 16 U/L (ref 0–35)
AST: 20 U/L (ref 0–37)
Albumin: 4.4 g/dL (ref 3.5–5.2)
Alkaline Phosphatase: 42 U/L (ref 39–117)
BUN: 9 mg/dL (ref 6–23)
CO2: 29 meq/L (ref 19–32)
Calcium: 9.8 mg/dL (ref 8.4–10.5)
Chloride: 99 meq/L (ref 96–112)
Creatinine, Ser: 0.65 mg/dL (ref 0.40–1.20)
GFR: 88.66 mL/min
Glucose, Bld: 129 mg/dL — ABNORMAL HIGH (ref 70–99)
Potassium: 4.7 meq/L (ref 3.5–5.1)
Sodium: 135 meq/L (ref 135–145)
Total Bilirubin: 0.5 mg/dL (ref 0.2–1.2)
Total Protein: 7.7 g/dL (ref 6.0–8.3)

## 2021-01-14 LAB — LIPID PANEL
Cholesterol: 139 mg/dL (ref 0–200)
HDL: 60.4 mg/dL (ref >39.00)
LDL Cholesterol: 59 mg/dL (ref 0–99)
NonHDL: 78.17
Total CHOL/HDL Ratio: 2
Triglycerides: 97 mg/dL (ref 0.0–149.0)
VLDL: 19.4 mg/dL (ref 0.0–40.0)

## 2021-01-14 LAB — HM DIABETES FOOT EXAM

## 2021-01-14 LAB — HEMOGLOBIN A1C: Hgb A1c MFr Bld: 7.2 % — ABNORMAL HIGH (ref 4.6–6.5)

## 2021-01-14 NOTE — Assessment & Plan Note (Signed)
Lab Results  Component Value Date   HGBA1C 7.3 (H) 01/13/2020   Hopefully still okkay on trulicity, glipizide and metformin Increase trulicity if control worsens Is on statin

## 2021-01-14 NOTE — Assessment & Plan Note (Signed)
See social history 

## 2021-01-14 NOTE — Assessment & Plan Note (Signed)
Quiet on the daily omeprazole

## 2021-01-14 NOTE — Assessment & Plan Note (Signed)
No symptoms of recurrence 

## 2021-01-14 NOTE — Assessment & Plan Note (Signed)
Doing well on the hydroxyurea

## 2021-01-14 NOTE — Progress Notes (Signed)
Subjective:    Patient ID: Savannah Evans, female    DOB: 12-26-1950, 70 y.o.   MRN: [id number]  HPI Here for Medicare wellness visit and follow up of chronic health conditions This visit occurred during the SARS-CoV-2 public health emergency.  Safety protocols were in place, including screening questions prior to the visit, additional usage of staff PPE, and extensive cleaning of exam room while observing appropriate contact time as indicated for disinfecting solutions.   Reviewed form and advanced directives Reviewed other doctors No alcohol or tobacco Exercises regularly Vision and hearing are fine No falls No depression or anhedonia Independent with instrumental ADLs No sig memory problems  Platelet count down to 418K---this is good Hemoglobin also okay Remains on the hydroxyurea  Checks sugars rarely Usually 150---but not always fasting She feels like she is doing okay No foot numbness, burning or pain. Occasional AM hand numbness---likely sleep position Keeps up with eye exams  No palpitations or racing heart No chest pain or SOB Stamina remains good No resistance No edema No dizziness or syncope  Takes prilosec daily No heartburn on this No dysphagia  Current Outpatient Medications on File Prior to Visit  Medication Sig Dispense Refill   Ascorbic Acid (VITAMIN C) 500 MG tablet Take 500 mg by mouth daily.       aspirin 81 MG tablet Take 81 mg by mouth daily.       calcium-vitamin D (OSCAL) 250-125 MG-UNIT per tablet Take 1 tablet by mouth daily.       cetirizine (ZYRTEC) 10 MG tablet Take 10 mg by mouth daily as needed for allergies.      ferrous sulfate 325 (65 FE) MG tablet Take 325 mg by mouth daily with breakfast.     fish oil-omega-3 fatty acids 1000 MG capsule Take 1 g by mouth daily.      glipiZIDE (GLUCOTROL) 5 MG tablet TAKE 1 TABLET (5 MG TOTAL) BY MOUTH 2 (TWO) TIMES DAILY BEFORE A MEAL. 180 tablet 0   hydroxyurea (HYDREA) 500 MG capsule Take 2  capsules on Mondays, Wednesdays and Fridays, 1 capsule rest of the week 100 capsule 1   metFORMIN (GLUCOPHAGE) 1000 MG tablet TAKE 1 TABLET BY MOUTH TWICE A DAY W/ A MEAL 180 tablet 3   Multiple Vitamin (MULTIVITAMIN) tablet Take 1 tablet by mouth daily.       omeprazole (PRILOSEC OTC) 20 MG tablet Take 20 mg by mouth daily.      simvastatin (ZOCOR) 10 MG tablet TAKE 1 TABLET BY MOUTH DAILY 90 tablet 0   TRULICITY 1.5 WU/9.8JX SOPN INJECT 1.5 MG INTO THE SKIN ONCE A WEEK. 2 mL 5   No current facility-administered medications on file prior to visit.    No Known Allergies  Past Medical History:  Diagnosis Date   Allergic rhinitis    Anemia, unspecified 03/03/2013   Diabetes mellitus type 2, noninsulin dependent ([place])    Diaphragmatic hernia    Essential thrombocythemia ([place]) 10/21/2012   JAK2 neg; BCR-ABL neg; bone marrow biopsy on 09/29/12 showed normal cytogenetics.    GERD (gastroesophageal reflux disease)    History of palpitations    Hyperlipidemia    Iron deficiency    Osteoarthritis    SVT (supraventricular tachycardia) ([facility])     Past Surgical History:  Procedure Laterality Date   COLONOSCOPY     neg age 17.  Next age 46.    svt ablation  2000   Dr. Caryl Comes    Family History  Problem Relation Age of Onset   Heart attack Father    Uterine cancer Sister    Diabetes Sister    Macular degeneration Mother    Cancer Paternal Aunt        cancer?   Cancer Paternal Uncle        cancer?   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[employer]    Comment: Retired 2020  Tobacco Use   Smoking status: Never   Smokeless tobacco: Never  Substance and Sexual Activity   Alcohol use: Yes     Alcohol/week: 1.0 standard drink    Types: 1 Glasses of wine per week    Comment: social   Drug use: No   Sexual activity: Not on file  Other Topics Concern   Not on file  Social History Narrative   Has living will   Requests husband as health care POA-- alternate is daughter [place]ta [place]   Would accept resuscitation attempts but no prolonged ventilation   No tube feeds if cognitively unaware   Social Determinants of Health   Financial Resource Strain: Not on file  Food Insecurity: Not on file  Transportation Needs: Not on file  Physical Activity: Not on file  Stress: Not on file  Social Connections: Not on file  Intimate Partner Violence: Not on file   Review of Systems Appetite is good Weight down 1# Sleeps well Wears seat belt Teeth okay---sees dentist No suspicious skin lesions No sig back or joint pains Bowels move fine--no blood No urinary problems or incontinence    Objective:   Physical Exam Constitutional:      Appearance: Normal appearance.  HENT:     Mouth/Throat:     Comments: No lesions Eyes:     Conjunctiva/sclera: Conjunctivae normal.     Pupils: Pupils are equal, round, and reactive to light.  Cardiovascular:     Rate and Rhythm: Normal rate and regular rhythm.     Pulses: Normal pulses.     Heart sounds: No murmur heard.   No gallop.  Pulmonary:     Effort: Pulmonary effort is normal.     Breath sounds: Normal breath sounds. No wheezing or rales.  Abdominal:     Palpations: Abdomen is soft.     Tenderness: There is no abdominal tenderness.  Musculoskeletal:     Cervical back: Neck supple.  Lymphadenopathy:     Cervical: No cervical adenopathy.  Skin:    General: Skin is warm.     Findings: No rash.     Comments: No foot lesions  Neurological:     Mental Status: She is alert and oriented to person, place, and time.     Comments: President----"Biden, Trump, Obama" [id number] Recall 3/3  Normal sensation in feet   Psychiatric:        Mood and Affect: Mood normal.        Behavior: Behavior normal.           Assessment & Plan:

## 2021-01-14 NOTE — Progress Notes (Signed)
Hearing Screening   '500Hz'$  '1000Hz'$  '2000Hz'$  '4000Hz'$   Right ear '20 20 20 20  '$ Left ear '20 20 20 20   '$ Vision Screening   Right eye Left eye Both eyes  Without correction     With correction   20/20  Comments: Patty vision appointments annually (patient wears contacts one for far one for near)

## 2021-01-14 NOTE — Assessment & Plan Note (Signed)
I have personally reviewed the Medicare Annual Wellness questionnaire and have noted 1. The patient's medical and social history 2. Their use of alcohol, tobacco or illicit drugs 3. Their current medications and supplements 4. The patient's functional ability including ADL's, fall risks, home safety risks and hearing or visual             impairment. 5. Diet and physical activities 6. Evidence for depression or mood disorders  The patients weight, height, BMI and visual acuity have been recorded in the chart I have made referrals, counseling and provided education to the patient based review of the above and I have provided the pt with a written personalized care plan for preventive services.  I have provided you with a copy of your personalized plan for preventive services. Please take the time to review along with your updated medication list.  Had shingrix Will get flu and bivalent COVID vaccines soon Mammogram due in 1 year (every 2 years) Colon 2024 Discussed adding resistance exercise

## 2021-02-07 ENCOUNTER — Other Ambulatory Visit: Payer: Self-pay | Admitting: Internal Medicine

## 2021-02-16 DIAGNOSIS — E119 Type 2 diabetes mellitus without complications: Secondary | ICD-10-CM | POA: Diagnosis not present

## 2021-02-16 LAB — HM DIABETES EYE EXAM

## 2021-04-01 ENCOUNTER — Other Ambulatory Visit: Payer: Self-pay | Admitting: Internal Medicine

## 2021-04-03 ENCOUNTER — Other Ambulatory Visit: Payer: Self-pay | Admitting: Hematology and Oncology

## 2021-05-12 ENCOUNTER — Encounter: Payer: Self-pay | Admitting: Hematology and Oncology

## 2021-05-12 ENCOUNTER — Other Ambulatory Visit: Payer: Self-pay

## 2021-05-12 ENCOUNTER — Inpatient Hospital Stay: Payer: Medicare Other | Attending: Hematology and Oncology

## 2021-05-12 ENCOUNTER — Inpatient Hospital Stay (HOSPITAL_BASED_OUTPATIENT_CLINIC_OR_DEPARTMENT_OTHER): Payer: Medicare Other | Admitting: Hematology and Oncology

## 2021-05-12 DIAGNOSIS — I10 Essential (primary) hypertension: Secondary | ICD-10-CM | POA: Insufficient documentation

## 2021-05-12 DIAGNOSIS — D473 Essential (hemorrhagic) thrombocythemia: Secondary | ICD-10-CM | POA: Insufficient documentation

## 2021-05-12 DIAGNOSIS — D630 Anemia in neoplastic disease: Secondary | ICD-10-CM | POA: Insufficient documentation

## 2021-05-12 LAB — CBC WITH DIFFERENTIAL/PLATELET
Abs Immature Granulocytes: 0.01 10*3/uL (ref 0.00–0.07)
Basophils Absolute: 0 10*3/uL (ref 0.0–0.1)
Basophils Relative: 1 %
Eosinophils Absolute: 0.1 10*3/uL (ref 0.0–0.5)
Eosinophils Relative: 2 %
HCT: 32.1 % — ABNORMAL LOW (ref 36.0–46.0)
Hemoglobin: 11.1 g/dL — ABNORMAL LOW (ref 12.0–15.0)
Immature Granulocytes: 0 %
Lymphocytes Relative: 45 %
Lymphs Abs: 2.3 10*3/uL (ref 0.7–4.0)
MCH: 36.9 pg — ABNORMAL HIGH (ref 26.0–34.0)
MCHC: 34.6 g/dL (ref 30.0–36.0)
MCV: 106.6 fL — ABNORMAL HIGH (ref 80.0–100.0)
Monocytes Absolute: 0.5 10*3/uL (ref 0.1–1.0)
Monocytes Relative: 10 %
Neutro Abs: 2.2 10*3/uL (ref 1.7–7.7)
Neutrophils Relative %: 42 %
Platelets: 465 10*3/uL — ABNORMAL HIGH (ref 150–400)
RBC: 3.01 MIL/uL — ABNORMAL LOW (ref 3.87–5.11)
RDW: 11.8 % (ref 11.5–15.5)
WBC: 5.1 10*3/uL (ref 4.0–10.5)
nRBC: 0 % (ref 0.0–0.2)

## 2021-05-12 MED ORDER — HYDROXYUREA 500 MG PO CAPS: ORAL_CAPSULE | ORAL | 1 refills | Status: DC

## 2021-05-12 NOTE — Assessment & Plan Note (Signed)
Her platelet count is well controlled Her anemia is stable I do not plan to change the dose of hydroxyurea She will also continue taking aspirin

## 2021-05-12 NOTE — Progress Notes (Signed)
[place] OFFICE PROGRESS NOTE  Patient Care Team: [name], MD as PCP - General Barbaraann Rondo, MD as Attending Physician (Obstetrics and Gynecology) Heath Lark, MD as Consulting Physician (Hematology and Oncology) Debbora Dus, [facility] as Pharmacist (Pharmacist)  ASSESSMENT & PLAN:  Essential thrombocythemia Her platelet count is well controlled Her anemia is stable I do not plan to change the dose of hydroxyurea She will also continue taking aspirin  Anemia in neoplastic disease She has borderline iron deficiency that could be contributing to her anemia, in addition to anemia chronic illness secondary to diabetes and hydroxyurea I recommend a very low-dose oral iron which she takes every other day and she tolerated that very well She does not need B12 supplement  Essential hypertension It is likely there is an element of whitecoat hypertension She will continue aggressive blood pressure management by primary care doctor  No orders of the defined types were placed in this encounter.   All questions were answered. The patient knows to call the clinic with any problems, questions or concerns. The total time spent in the appointment was 20 minutes encounter with patients including review of chart and various tests results, discussions about plan of care and coordination of care plan   Heath Lark, MD 05/12/2021 1:02 PM  INTERVAL HISTORY: Please see below for problem oriented charting. she returns for treatment follow-up on hydroxyurea for essential thrombocytosis She is doing well Denies missing doses No recent infection She felt better when she takes oral iron supplement  REVIEW OF SYSTEMS:   Constitutional: Denies fevers, chills or abnormal weight loss Eyes: Denies blurriness of vision Ears, nose, mouth, throat, and face: Denies mucositis or sore throat Respiratory: Denies cough, dyspnea or wheezes Cardiovascular: Denies palpitation, chest  discomfort or lower extremity swelling Gastrointestinal:  Denies nausea, heartburn or change in bowel habits Skin: Denies abnormal skin rashes Lymphatics: Denies new lymphadenopathy or easy bruising Neurological:Denies numbness, tingling or new weaknesses Behavioral/Psych: Mood is stable, no new changes  All other systems were reviewed with the patient and are negative.  I have reviewed the past medical history, past surgical history, social history and family history with the patient and they are unchanged from previous note.  ALLERGIES:  has No Known Allergies.  MEDICATIONS:  Current Outpatient Medications  Medication Sig Dispense Refill   Ascorbic Acid (VITAMIN C) 500 MG tablet Take 500 mg by mouth daily.       aspirin 81 MG tablet Take 81 mg by mouth daily.       calcium-vitamin D (OSCAL) 250-125 MG-UNIT per tablet Take 1 tablet by mouth daily.       cetirizine (ZYRTEC) 10 MG tablet Take 10 mg by mouth daily as needed for allergies.      ferrous sulfate 325 (65 FE) MG tablet Take 325 mg by mouth daily with breakfast.     fish oil-omega-3 fatty acids 1000 MG capsule Take 1 g by mouth daily.      glipiZIDE (GLUCOTROL) 5 MG tablet TAKE 1 TABLET (5 MG TOTAL) BY MOUTH 2 (TWO) TIMES DAILY BEFORE A MEAL. 180 tablet 3   hydroxyurea (HYDREA) 500 MG capsule TAKE 1 CAPSULE BY MOUTH EVERY DAY except for Mondays, Wednesdays and Fridays take 2 capsules 90 capsule 1   metFORMIN (GLUCOPHAGE) 1000 MG tablet TAKE 1 TABLET BY MOUTH TWICE A DAY W/ A MEAL 180 tablet 3   Multiple Vitamin (MULTIVITAMIN) tablet Take 1 tablet by mouth daily.  omeprazole (PRILOSEC OTC) 20 MG tablet Take 20 mg by mouth daily.      simvastatin (ZOCOR) 10 MG tablet TAKE 1 TABLET BY MOUTH EVERY DAY 90 tablet 3   TRULICITY 1.5 IR/4.8NI SOPN INJECT 1.5 MG INTO THE SKIN ONCE A WEEK. 2 mL 12   No current facility-administered medications for this visit.    SUMMARY OF ONCOLOGIC HISTORY: Oncology History  Essential  thrombocythemia ([place])  10/11/2012 Initial Diagnosis   She was evaluated for abnormal CBC   10/14/2012 Bone Marrow Biopsy   Bone Marrow, Aspirate,Biopsy, and Clot, right iliac - VARIABLY CELLULAR BONE MARROW FOR AGE WITH ATYPICAL MEGAKARYOCYTIC PROLIFERATION - SEE COMMENT. PERIPHERAL BLOOD: - NORMOCYTIC-NORMOCHROMIC ANEMIA. - THROMBOCYTOSIS. Diagnosis Note The bone marrow material is limited with lack of aspirate or adequate touch imprints for cytomorphologic evaluation. Nonetheless, there is an atypical megakaryocytic proliferation in the core biopsy worrisome for a myeloproliferative neoplasm (chronic myeloproliferative disorder) particularly essential thrombocythemia. Correlation with cytogenetic studies is recommended. BCR/ABl is neg   10/21/2012 Initial Diagnosis   Essential thrombocythemia ([place])   12/13/2012 -  Chemotherapy   On 01/08/2014, dose of hydroxyurea was modified to take Hydrea,500 mg 6 days a week and skip on Sundays. Since September 2015, she reduce hydroxyurea by skipping doses on Wednesday and Sundays On 05/04/2014, I reduce hydroxyurea dose further to 500 mg 4 days a week and to add anagrelide. Due to difficulties obtaining the pills, she had not been able to take hydroxyurea and anagrelide. On 06/04/14: dose of hydroxyurea is increased to 500 mg daily. In 2019, the dose of Hydrea was increased to 500 mg daily except on Saturdays and Sundays she take 1000 mg On April 30, 2020, the dose of hydroxyurea is increased to 1000 mg on Mondays, Wednesdays and Fridays and to take 500 mg for the rest of the week      PHYSICAL EXAMINATION: ECOG PERFORMANCE STATUS: 1 - Symptomatic but completely ambulatory  Vitals:   05/12/21 0925  BP: (!) 158/75  Pulse: 87  Resp: 18  Temp: (!) 97 F (36.1 C)  SpO2: 99%   Filed Weights   05/12/21 0925  Weight: 154 lb 6.4 oz (70 kg)    GENERAL:alert, no distress and comfortable SKIN: skin color, texture, turgor are normal, no rashes  or significant lesions EYES: normal, Conjunctiva are pink and non-injected, sclera clear OROPHARYNX:no exudate, no erythema and lips, buccal mucosa, and tongue normal  NECK: supple, thyroid normal size, non-tender, without nodularity LYMPH:  no palpable lymphadenopathy in the cervical, axillary or inguinal LUNGS: clear to auscultation and percussion with normal breathing effort HEART: regular rate & rhythm and no murmurs and no lower extremity edema ABDOMEN:abdomen soft, non-tender and normal bowel sounds Musculoskeletal:no cyanosis of digits and no clubbing  NEURO: alert & oriented x 3 with fluent speech, no focal motor/sensory deficits  LABORATORY DATA:  I have reviewed the data as listed    Component Value Date/Time   NA 135 01/14/2021 1024   NA 136 03/31/2013 1414   K 4.7 01/14/2021 1024   K 4.0 03/31/2013 1414   CL 99 01/14/2021 1024   CO2 29 01/14/2021 1024   CO2 24 03/31/2013 1414   GLUCOSE 129 (H) 01/14/2021 1024   GLUCOSE 178 (H) 03/31/2013 1414   BUN 9 01/14/2021 1024   BUN 8.1 03/31/2013 1414   CREATININE 0.65 01/14/2021 1024   CREATININE 0.7 03/31/2013 1414   CALCIUM 9.8 01/14/2021 1024   CALCIUM 10.1 03/31/2013 1414   PROT 7.7 01/14/2021  1024   PROT 7.2 03/31/2013 1414   ALBUMIN 4.4 01/14/2021 1024   ALBUMIN 3.9 03/31/2013 1414   AST 20 01/14/2021 1024   AST 19 03/31/2013 1414   ALT 16 01/14/2021 1024   ALT 15 03/31/2013 1414   ALKPHOS 42 01/14/2021 1024   ALKPHOS 55 03/31/2013 1414   BILITOT 0.5 01/14/2021 1024   BILITOT 0.28 03/31/2013 1414   GFRNONAA 112.68 10/29/2009 1221    No results found for: SPEP, UPEP  Lab Results  Component Value Date   WBC 5.1 05/12/2021   NEUTROABS 2.2 05/12/2021   HGB 11.1 (L) 05/12/2021   HCT 32.1 (L) 05/12/2021   MCV 106.6 (H) 05/12/2021   PLT 465 (H) 05/12/2021      Chemistry      Component Value Date/Time   NA 135 01/14/2021 1024   NA 136 03/31/2013 1414   K 4.7 01/14/2021 1024   K 4.0 03/31/2013 1414   CL  99 01/14/2021 1024   CO2 29 01/14/2021 1024   CO2 24 03/31/2013 1414   BUN 9 01/14/2021 1024   BUN 8.1 03/31/2013 1414   CREATININE 0.65 01/14/2021 1024   CREATININE 0.7 03/31/2013 1414      Component Value Date/Time   CALCIUM 9.8 01/14/2021 1024   CALCIUM 10.1 03/31/2013 1414   ALKPHOS 42 01/14/2021 1024   ALKPHOS 55 03/31/2013 1414   AST 20 01/14/2021 1024   AST 19 03/31/2013 1414   ALT 16 01/14/2021 1024   ALT 15 03/31/2013 1414   BILITOT 0.5 01/14/2021 1024   BILITOT 0.28 03/31/2013 1414

## 2021-05-12 NOTE — Assessment & Plan Note (Signed)
She has borderline iron deficiency that could be contributing to her anemia, in addition to anemia chronic illness secondary to diabetes and hydroxyurea I recommend a very low-dose oral iron which she takes every other day and she tolerated that very well She does not need B12 supplement 

## 2021-05-12 NOTE — Assessment & Plan Note (Signed)
It is likely there is an element of whitecoat hypertension She will continue aggressive blood pressure management by primary care doctor

## 2021-06-01 ENCOUNTER — Other Ambulatory Visit: Payer: Self-pay | Admitting: Internal Medicine

## 2021-06-01 DIAGNOSIS — Z1231 Encounter for screening mammogram for malignant neoplasm of breast: Secondary | ICD-10-CM

## 2021-06-10 ENCOUNTER — Other Ambulatory Visit: Payer: Self-pay | Admitting: Hematology and Oncology

## 2021-07-08 ENCOUNTER — Ambulatory Visit
Admission: RE | Admit: 2021-07-08 | Discharge: 2021-07-08 | Disposition: A | Discharge status: Home / Self Care | Payer: Medicare Other | Source: Ambulatory Visit | Attending: Internal Medicine | Admitting: Internal Medicine

## 2021-07-08 ENCOUNTER — Other Ambulatory Visit: Payer: Self-pay

## 2021-07-08 DIAGNOSIS — Z1231 Encounter for screening mammogram for malignant neoplasm of breast: Secondary | ICD-10-CM | POA: Insufficient documentation

## 2021-07-15 ENCOUNTER — Ambulatory Visit (INDEPENDENT_AMBULATORY_CARE_PROVIDER_SITE_OTHER): Payer: Medicare Other | Admitting: Internal Medicine

## 2021-07-15 ENCOUNTER — Encounter: Payer: Self-pay | Admitting: Internal Medicine

## 2021-07-15 ENCOUNTER — Other Ambulatory Visit: Payer: Self-pay

## 2021-07-15 VITALS — BP 122/82 | HR 95 | Temp 97.5°F | Ht 63.75 in | Wt 153.0 lb

## 2021-07-15 DIAGNOSIS — E1159 Type 2 diabetes mellitus with other circulatory complications: Secondary | ICD-10-CM | POA: Diagnosis not present

## 2021-07-15 DIAGNOSIS — I10 Essential (primary) hypertension: Secondary | ICD-10-CM | POA: Diagnosis not present

## 2021-07-15 DIAGNOSIS — K2100 Gastro-esophageal reflux disease with esophagitis, without bleeding: Secondary | ICD-10-CM | POA: Diagnosis not present

## 2021-07-15 LAB — POCT GLYCOSYLATED HEMOGLOBIN (HGB A1C): Hemoglobin A1C: 7.1 % — AB (ref 4.0–5.6)

## 2021-07-15 NOTE — Progress Notes (Signed)
? ?Subjective:  ? ? Patient ID: Savannah Evans, female    DOB: 04-Apr-1951, 71 y.o.   MRN: [id number] ? ?HPI ?Here for follow up of diabetes ? ?She also has some reflux issues ?Noted bloating, etc after snacks at a movie (in evening) 1 week ago ?Then woke at 1AM--with terrible reflux (water brash and cough) ?May have aspirated it was so bad ?Better after sleeping in chair ?Now has raspy cough after eating ?No dysphagia ?Takes omeprazole at night ? ?Did finally join the [employer] ?Going 3 days per week---stationary bike, treadmill, some equipment ?Weight is stable ?Trying to be careful with eating ? ?Will check sugars every 1-2 weeks ?130-160 ?No low sugar reactions ?No GI symptoms on trulicity ? ?No chest pain ?No SOB ? ?Current Outpatient Medications on File Prior to Visit  ?Medication Sig Dispense Refill  ? Ascorbic Acid (VITAMIN C) 500 MG tablet Take 500 mg by mouth daily.      ? aspirin 81 MG tablet Take 81 mg by mouth daily.      ? calcium-vitamin D (OSCAL) 250-125 MG-UNIT per tablet Take 1 tablet by mouth daily.      ? cetirizine (ZYRTEC) 10 MG tablet Take 10 mg by mouth daily as needed for allergies.     ? ferrous sulfate 325 (65 FE) MG tablet Take 325 mg by mouth daily with breakfast.    ? fish oil-omega-3 fatty acids 1000 MG capsule Take 1 g by mouth daily.     ? glipiZIDE (GLUCOTROL) 5 MG tablet TAKE 1 TABLET (5 MG TOTAL) BY MOUTH 2 (TWO) TIMES DAILY BEFORE A MEAL. 180 tablet 3  ? hydroxyurea (HYDREA) 500 MG capsule TAKE 2 CAPSULES ON MONDAYS, WEDNESDAYS AND FRIDAYS, 1 CAPSULE REST OF THE WEEK 100 capsule 1  ? metFORMIN (GLUCOPHAGE) 1000 MG tablet TAKE 1 TABLET BY MOUTH TWICE A DAY W/ A MEAL 180 tablet 3  ? Multiple Vitamin (MULTIVITAMIN) tablet Take 1 tablet by mouth daily.      ? omeprazole (PRILOSEC OTC) 20 MG tablet Take 20 mg by mouth daily.     ? simvastatin (ZOCOR) 10 MG tablet TAKE 1 TABLET BY MOUTH EVERY DAY 90 tablet 3  ? TRULICITY 1.5 SV/7.7LT SOPN INJECT 1.5 MG INTO THE SKIN ONCE A WEEK. 2 mL 12  ? ?No  current facility-administered medications on file prior to visit.  ? ? ?No Known Allergies ? ?Past Medical History:  ?Diagnosis Date  ? Allergic rhinitis   ? Anemia, unspecified 03/03/2013  ? Diabetes mellitus type 2, noninsulin dependent ([place])   ? Diaphragmatic hernia   ? Essential thrombocythemia ([place]) 10/21/2012  ? JAK2 neg; BCR-ABL neg; bone marrow biopsy on 09/29/12 showed normal cytogenetics.   ? GERD (gastroesophageal reflux disease)   ? History of palpitations   ? Hyperlipidemia   ? Iron deficiency   ? Osteoarthritis   ? SVT (supraventricular tachycardia) ([place])   ? ? ?Past Surgical History:  ?Procedure Laterality Date  ? COLONOSCOPY    ? neg age 78.  Next age 74.   ? svt ablation  2000  ? Dr. Caryl Comes  ? ? ?Family History  ?Problem Relation Age of Onset  ? Heart attack Father   ? Uterine cancer Sister   ? Diabetes Sister   ? Macular degeneration Mother   ? Cancer Paternal Aunt   ?     cancer?  ? Cancer Paternal Uncle   ?     cancer?  ? Colon cancer Neg Hx   ?  Breast cancer Neg Hx   ? ? ?Social History  ? ?Socioeconomic History  ? Marital status: Married  ?  Spouse name: Not on file  ? Number of children: 3  ? Years of education: Not on file  ? Highest education level: Not on file  ?Occupational History  ? Occupation: Automotive engineer area for [employer]  ?  Comment: Retired 2020  ?Tobacco Use  ? Smoking status: Never  ? Smokeless tobacco: Never  ?Substance and Sexual Activity  ? Alcohol use: Yes  ?  Alcohol/week: 1.0 standard drink  ?  Types: 1 Glasses of wine per week  ?  Comment: social  ? Drug use: No  ? Sexual activity: Not on file  ?Other Topics Concern  ? Not on file  ?Social History Narrative  ? Has living will  ? Requests husband as health care POA-- alternate is daughter Colletta [place]  ? Would accept resuscitation attempts but no prolonged ventilation  ? No tube feeds if cognitively unaware  ? ?Social Determinants of Health  ? ?Financial Resource Strain: Not on file  ?Food Insecurity: Not on  file  ?Transportation Needs: Not on file  ?Physical Activity: Not on file  ?Stress: Not on file  ?Social Connections: Not on file  ?Intimate Partner Violence: Not on file  ? ?Review of Systems ?Sleeps fine in general ?Platelets are stable--goes back at the end of the month ? ?   ?Objective:  ? Physical Exam ?Constitutional:   ?   Appearance: Normal appearance.  ?Cardiovascular:  ?   Rate and Rhythm: Normal rate and regular rhythm.  ?   Pulses: Normal pulses.  ?   Heart sounds: No murmur heard. ?  No gallop.  ?Pulmonary:  ?   Effort: Pulmonary effort is normal.  ?   Breath sounds: Normal breath sounds. No wheezing or rales.  ?Abdominal:  ?   Palpations: Abdomen is soft.  ?   Tenderness: There is no abdominal tenderness.  ?Musculoskeletal:  ?   Cervical back: Neck supple.  ?   Right lower leg: No edema.  ?   Left lower leg: No edema.  ?Lymphadenopathy:  ?   Cervical: No cervical adenopathy.  ?Skin: ?   Comments: No foot lesions  ?Neurological:  ?   Mental Status: She is alert.  ?  ? ? ? ? ?   ?Assessment & Plan:  ? ?

## 2021-07-15 NOTE — Assessment & Plan Note (Signed)
Clear exacerbation one week ago ?Discussed increasing the omeprazole to 20mg  bid for 1-2 weeks--then back to daily ?

## 2021-07-15 NOTE — Assessment & Plan Note (Signed)
Lab Results  ?Component Value Date  ? HGBA1C 7.1 (A) 07/15/2021  ? ?Good control still ?On glipizide 5 bid, metformin 3818 bid and trulicity 1.5mg  weekly ?

## 2021-07-15 NOTE — Assessment & Plan Note (Signed)
BP Readings from Last 3 Encounters:  ?07/15/21 122/82  ?05/12/21 (!) 158/75  ?01/14/21 (!) 144/86  ? ?Okay without meds ?

## 2021-08-09 ENCOUNTER — Inpatient Hospital Stay: Payer: Medicare Other

## 2021-08-09 ENCOUNTER — Encounter: Payer: Self-pay | Admitting: Hematology and Oncology

## 2021-08-09 ENCOUNTER — Inpatient Hospital Stay: Payer: Medicare Other | Attending: Hematology and Oncology | Admitting: Hematology and Oncology

## 2021-08-09 ENCOUNTER — Other Ambulatory Visit: Payer: Self-pay

## 2021-08-09 DIAGNOSIS — E119 Type 2 diabetes mellitus without complications: Secondary | ICD-10-CM | POA: Insufficient documentation

## 2021-08-09 DIAGNOSIS — D473 Essential (hemorrhagic) thrombocythemia: Secondary | ICD-10-CM | POA: Diagnosis not present

## 2021-08-09 DIAGNOSIS — I10 Essential (primary) hypertension: Secondary | ICD-10-CM

## 2021-08-09 DIAGNOSIS — Z7982 Long term (current) use of aspirin: Secondary | ICD-10-CM | POA: Insufficient documentation

## 2021-08-09 DIAGNOSIS — Z79899 Other long term (current) drug therapy: Secondary | ICD-10-CM | POA: Diagnosis not present

## 2021-08-09 DIAGNOSIS — D630 Anemia in neoplastic disease: Secondary | ICD-10-CM

## 2021-08-09 DIAGNOSIS — D638 Anemia in other chronic diseases classified elsewhere: Secondary | ICD-10-CM | POA: Diagnosis not present

## 2021-08-09 LAB — CBC WITH DIFFERENTIAL/PLATELET
Abs Immature Granulocytes: 0.01 10*3/uL (ref 0.00–0.07)
Basophils Absolute: 0 10*3/uL (ref 0.0–0.1)
Basophils Relative: 1 %
Eosinophils Absolute: 0.2 10*3/uL (ref 0.0–0.5)
Eosinophils Relative: 4 %
HCT: 32.3 % — ABNORMAL LOW (ref 36.0–46.0)
Hemoglobin: 11 g/dL — ABNORMAL LOW (ref 12.0–15.0)
Immature Granulocytes: 0 %
Lymphocytes Relative: 43 %
Lymphs Abs: 2.1 10*3/uL (ref 0.7–4.0)
MCH: 36.1 pg — ABNORMAL HIGH (ref 26.0–34.0)
MCHC: 34.1 g/dL (ref 30.0–36.0)
MCV: 105.9 fL — ABNORMAL HIGH (ref 80.0–100.0)
Monocytes Absolute: 0.5 10*3/uL (ref 0.1–1.0)
Monocytes Relative: 10 %
Neutro Abs: 2.1 10*3/uL (ref 1.7–7.7)
Neutrophils Relative %: 42 %
Platelets: 467 10*3/uL — ABNORMAL HIGH (ref 150–400)
RBC: 3.05 MIL/uL — ABNORMAL LOW (ref 3.87–5.11)
RDW: 11.8 % (ref 11.5–15.5)
WBC: 4.9 10*3/uL (ref 4.0–10.5)
nRBC: 0 % (ref 0.0–0.2)

## 2021-08-09 NOTE — Progress Notes (Signed)
[place] ?OFFICE PROGRESS NOTE ? ?Patient Care Team: ?[name], MD as PCP - General ?Barbaraann Rondo, MD as Attending Physician (Obstetrics and Gynecology) ?Heath Lark, MD as Consulting Physician (Hematology and Oncology) ?Debbora Dus, [facility] as Pharmacist (Pharmacist) ? ?ASSESSMENT & PLAN:  ?Essential thrombocythemia ?Her platelet count is well controlled, occasionally fluctuate up and down but remain in the 400 range ?Her anemia is stable ?I do not plan to change the dose of hydroxyurea ?She will also continue taking aspirin ? ?Anemia in neoplastic disease ?She has borderline iron deficiency that could be contributing to her anemia, in addition to anemia chronic illness secondary to diabetes and hydroxyurea ?I recommend a very low-dose oral iron which she takes every other day and she tolerated that very well ?She does not need B12 supplement ? ?Essential hypertension ?She is not symptomatic, her blood pressure is likely elevated due to anxiety.  Her recent documented blood pressure with her primary care doctor's visit is normal ?The patient started to go to the gym and exercise on a regular basis ?I recommend the patient to continue lifestyle changes ? ?No orders of the defined types were placed in this encounter. ? ? ?All questions were answered. The patient knows to call the clinic with any problems, questions or concerns. ?The total time spent in the appointment was 20 minutes encounter with patients including review of chart and various tests results, discussions about plan of care and coordination of care plan ?  ?Heath Lark, MD ?08/09/2021 9:52 AM ? ?INTERVAL HISTORY: ?Please see below for problem oriented charting. ?she returns for treatment follow-up on hydroxyurea for essential thrombocytosis ?Since January of this year, she started going to the gym 3 times a week with her husband ?She is doing well with extra energy ?She appears highly motivated to continue lifestyle  changes ?Denies recent infection or bleeding ? ?REVIEW OF SYSTEMS:   ?Constitutional: Denies fevers, chills or abnormal weight loss ?Eyes: Denies blurriness of vision ?Ears, nose, mouth, throat, and face: Denies mucositis or sore throat ?Respiratory: Denies cough, dyspnea or wheezes ?Cardiovascular: Denies palpitation, chest discomfort or lower extremity swelling ?Gastrointestinal:  Denies nausea, heartburn or change in bowel habits ?Skin: Denies abnormal skin rashes ?Lymphatics: Denies new lymphadenopathy or easy bruising ?Neurological:Denies numbness, tingling or new weaknesses ?Behavioral/Psych: Mood is stable, no new changes  ?All other systems were reviewed with the patient and are negative. ? ?I have reviewed the past medical history, past surgical history, social history and family history with the patient and they are unchanged from previous note. ? ?ALLERGIES:  has No Known Allergies. ? ?MEDICATIONS:  ?Current Outpatient Medications  ?Medication Sig Dispense Refill  ? Ascorbic Acid (VITAMIN C) 500 MG tablet Take 500 mg by mouth daily.      ? aspirin 81 MG tablet Take 81 mg by mouth daily.      ? calcium-vitamin D (OSCAL) 250-125 MG-UNIT per tablet Take 1 tablet by mouth daily.      ? cetirizine (ZYRTEC) 10 MG tablet Take 10 mg by mouth daily as needed for allergies.     ? ferrous sulfate 325 (65 FE) MG tablet Take 325 mg by mouth daily with breakfast.    ? fish oil-omega-3 fatty acids 1000 MG capsule Take 1 g by mouth daily.     ? glipiZIDE (GLUCOTROL) 5 MG tablet TAKE 1 TABLET (5 MG TOTAL) BY MOUTH 2 (TWO) TIMES DAILY BEFORE A MEAL. 180 tablet 3  ? hydroxyurea (HYDREA) 500 MG  capsule TAKE 2 CAPSULES ON MONDAYS, WEDNESDAYS AND FRIDAYS, 1 CAPSULE REST OF THE WEEK 100 capsule 1  ? metFORMIN (GLUCOPHAGE) 1000 MG tablet TAKE 1 TABLET BY MOUTH TWICE A DAY W/ A MEAL 180 tablet 3  ? Multiple Vitamin (MULTIVITAMIN) tablet Take 1 tablet by mouth daily.      ? omeprazole (PRILOSEC OTC) 20 MG tablet Take 20 mg by  mouth daily.     ? simvastatin (ZOCOR) 10 MG tablet TAKE 1 TABLET BY MOUTH EVERY DAY 90 tablet 3  ? TRULICITY 1.5 FT/7.3UK SOPN INJECT 1.5 MG INTO THE SKIN ONCE A WEEK. 2 mL 12  ? ?No current facility-administered medications for this visit.  ? ? ?SUMMARY OF ONCOLOGIC HISTORY: ?Oncology History  ?Essential thrombocythemia ([place])  ?10/11/2012 Initial Diagnosis  ? She was evaluated for abnormal CBC ?  ?10/14/2012 Bone Marrow Biopsy  ? Bone Marrow, Aspirate,Biopsy, and Clot, right iliac ?- VARIABLY CELLULAR BONE MARROW FOR AGE WITH ATYPICAL MEGAKARYOCYTIC ?PROLIFERATION ?- SEE COMMENT. ?PERIPHERAL BLOOD: ?- NORMOCYTIC-NORMOCHROMIC ANEMIA. ?- THROMBOCYTOSIS. ?Diagnosis Note ?The bone marrow material is limited with lack of aspirate or adequate touch imprints for cytomorphologic evaluation. Nonetheless, there is an atypical megakaryocytic proliferation in the core biopsy worrisome for a myeloproliferative neoplasm (chronic myeloproliferative disorder) particularly essential thrombocythemia. ?Correlation with cytogenetic studies is recommended. ?BCR/ABl is neg ?  ?10/21/2012 Initial Diagnosis  ? Essential thrombocythemia ([place]) ?  ?12/13/2012 -  Chemotherapy  ? On 01/08/2014, dose of hydroxyurea was modified to take Hydrea,500 mg 6 days a week and skip on Sundays. ?Since September 2015, she reduce hydroxyurea by skipping doses on Wednesday and Sundays ?On 05/04/2014, I reduce hydroxyurea dose further to 500 mg 4 days a week and to add anagrelide. Due to difficulties obtaining the pills, she had not been able to take hydroxyurea and anagrelide. ?On 06/04/14: dose of hydroxyurea is increased to 500 mg daily. ?In 2019, the dose of Hydrea was increased to 500 mg daily except on Saturdays and Sundays she take 1000 mg ?On April 30, 2020, the dose of hydroxyurea is increased to 1000 mg on Mondays, Wednesdays and Fridays and to take 500 mg for the rest of the week ? ?  ? ? ?PHYSICAL EXAMINATION: ?ECOG PERFORMANCE STATUS: 0 -  Asymptomatic ? ?Vitals:  ? 08/09/21 0829  ?BP: (!) 152/65  ?Pulse: 81  ?Resp: 18  ?Temp: 98 ?F (36.7 ?C)  ?SpO2: 100%  ? ?Filed Weights  ? 08/09/21 0829  ?Weight: 154 lb 3.2 oz (69.9 kg)  ? ? ?GENERAL:alert, no distress and comfortable ?NEURO: alert & oriented x 3 with fluent speech, no focal motor/sensory deficits ? ?LABORATORY DATA:  ?I have reviewed the data as listed ?   ?Component Value Date/Time  ? NA 135 01/14/2021 1024  ? NA 136 03/31/2013 1414  ? K 4.7 01/14/2021 1024  ? K 4.0 03/31/2013 1414  ? CL 99 01/14/2021 1024  ? CO2 29 01/14/2021 1024  ? CO2 24 03/31/2013 1414  ? GLUCOSE 129 (H) 01/14/2021 1024  ? GLUCOSE 178 (H) 03/31/2013 1414  ? BUN 9 01/14/2021 1024  ? BUN 8.1 03/31/2013 1414  ? CREATININE 0.65 01/14/2021 1024  ? CREATININE 0.7 03/31/2013 1414  ? CALCIUM 9.8 01/14/2021 1024  ? CALCIUM 10.1 03/31/2013 1414  ? PROT 7.7 01/14/2021 1024  ? PROT 7.2 03/31/2013 1414  ? ALBUMIN 4.4 01/14/2021 1024  ? ALBUMIN 3.9 03/31/2013 1414  ? AST 20 01/14/2021 1024  ? AST 19 03/31/2013 1414  ? ALT 16 01/14/2021 1024  ?  ALT 15 03/31/2013 1414  ? ALKPHOS 42 01/14/2021 1024  ? ALKPHOS 55 03/31/2013 1414  ? BILITOT 0.5 01/14/2021 1024  ? BILITOT 0.28 03/31/2013 1414  ? [facility] 112.68 10/29/2009 1221  ? ? ?No results found for: SPEP, UPEP ? ?Lab Results  ?Component Value Date  ? WBC 4.9 08/09/2021  ? NEUTROABS 2.1 08/09/2021  ? HGB 11.0 (L) 08/09/2021  ? HCT 32.3 (L) 08/09/2021  ? MCV 105.9 (H) 08/09/2021  ? PLT 467 (H) 08/09/2021  ? ? ?  Chemistry   ?   ?Component Value Date/Time  ? NA 135 01/14/2021 1024  ? NA 136 03/31/2013 1414  ? K 4.7 01/14/2021 1024  ? K 4.0 03/31/2013 1414  ? CL 99 01/14/2021 1024  ? CO2 29 01/14/2021 1024  ? CO2 24 03/31/2013 1414  ? BUN 9 01/14/2021 1024  ? BUN 8.1 03/31/2013 1414  ? CREATININE 0.65 01/14/2021 1024  ? CREATININE 0.7 03/31/2013 1414  ?    ?Component Value Date/Time  ? CALCIUM 9.8 01/14/2021 1024  ? CALCIUM 10.1 03/31/2013 1414  ? ALKPHOS 42 01/14/2021 1024  ? ALKPHOS 55  03/31/2013 1414  ? AST 20 01/14/2021 1024  ? AST 19 03/31/2013 1414  ? ALT 16 01/14/2021 1024  ? ALT 15 03/31/2013 1414  ? BILITOT 0.5 01/14/2021 1024  ? BILITOT 0.28 03/31/2013 1414  ?  ? ? ?

## 2021-08-09 NOTE — Assessment & Plan Note (Signed)
She has borderline iron deficiency that could be contributing to her anemia, in addition to anemia chronic illness secondary to diabetes and hydroxyurea I recommend a very low-dose oral iron which she takes every other day and she tolerated that very well She does not need B12 supplement 

## 2021-08-09 NOTE — Assessment & Plan Note (Addendum)
She is not symptomatic, her blood pressure is likely elevated due to anxiety.  Her recent documented blood pressure with her primary care doctor's visit is normal ?The patient started to go to the gym and exercise on a regular basis ?I recommend the patient to continue lifestyle changes ?

## 2021-08-09 NOTE — Assessment & Plan Note (Signed)
Her platelet count is well controlled, occasionally fluctuate up and down but remain in the 400 range ?Her anemia is stable ?I do not plan to change the dose of hydroxyurea ?She will also continue taking aspirin ?

## 2021-11-30 ENCOUNTER — Other Ambulatory Visit: Payer: Self-pay | Admitting: Internal Medicine

## 2021-11-30 ENCOUNTER — Other Ambulatory Visit: Payer: Self-pay | Admitting: Hematology and Oncology

## 2021-12-09 ENCOUNTER — Ambulatory Visit: Payer: Medicare Other | Admitting: Hematology and Oncology

## 2021-12-09 ENCOUNTER — Other Ambulatory Visit: Payer: Medicare Other

## 2021-12-13 ENCOUNTER — Inpatient Hospital Stay (HOSPITAL_BASED_OUTPATIENT_CLINIC_OR_DEPARTMENT_OTHER): Payer: Medicare Other | Admitting: Hematology and Oncology

## 2021-12-13 ENCOUNTER — Other Ambulatory Visit: Payer: Self-pay

## 2021-12-13 ENCOUNTER — Inpatient Hospital Stay: Payer: Medicare Other | Attending: Hematology and Oncology

## 2021-12-13 ENCOUNTER — Encounter: Payer: Self-pay | Admitting: Hematology and Oncology

## 2021-12-13 DIAGNOSIS — D630 Anemia in neoplastic disease: Secondary | ICD-10-CM | POA: Insufficient documentation

## 2021-12-13 DIAGNOSIS — Z79899 Other long term (current) drug therapy: Secondary | ICD-10-CM | POA: Insufficient documentation

## 2021-12-13 DIAGNOSIS — D638 Anemia in other chronic diseases classified elsewhere: Secondary | ICD-10-CM | POA: Diagnosis not present

## 2021-12-13 DIAGNOSIS — Z299 Encounter for prophylactic measures, unspecified: Secondary | ICD-10-CM | POA: Diagnosis not present

## 2021-12-13 DIAGNOSIS — E119 Type 2 diabetes mellitus without complications: Secondary | ICD-10-CM | POA: Insufficient documentation

## 2021-12-13 DIAGNOSIS — D473 Essential (hemorrhagic) thrombocythemia: Secondary | ICD-10-CM

## 2021-12-13 DIAGNOSIS — Z7982 Long term (current) use of aspirin: Secondary | ICD-10-CM | POA: Insufficient documentation

## 2021-12-13 DIAGNOSIS — I10 Essential (primary) hypertension: Secondary | ICD-10-CM | POA: Insufficient documentation

## 2021-12-13 LAB — CBC WITH DIFFERENTIAL/PLATELET
Abs Immature Granulocytes: 0.01 10*3/uL (ref 0.00–0.07)
Basophils Absolute: 0 10*3/uL (ref 0.0–0.1)
Basophils Relative: 1 %
Eosinophils Absolute: 0.1 10*3/uL (ref 0.0–0.5)
Eosinophils Relative: 1 %
HCT: 33 % — ABNORMAL LOW (ref 36.0–46.0)
Hemoglobin: 11.6 g/dL — ABNORMAL LOW (ref 12.0–15.0)
Immature Granulocytes: 0 %
Lymphocytes Relative: 39 %
Lymphs Abs: 1.9 10*3/uL (ref 0.7–4.0)
MCH: 36.9 pg — ABNORMAL HIGH (ref 26.0–34.0)
MCHC: 35.2 g/dL (ref 30.0–36.0)
MCV: 105.1 fL — ABNORMAL HIGH (ref 80.0–100.0)
Monocytes Absolute: 0.5 10*3/uL (ref 0.1–1.0)
Monocytes Relative: 10 %
Neutro Abs: 2.4 10*3/uL (ref 1.7–7.7)
Neutrophils Relative %: 49 %
Platelets: 467 10*3/uL — ABNORMAL HIGH (ref 150–400)
RBC: 3.14 MIL/uL — ABNORMAL LOW (ref 3.87–5.11)
RDW: 11.8 % (ref 11.5–15.5)
WBC: 4.8 10*3/uL (ref 4.0–10.5)
nRBC: 0 % (ref 0.0–0.2)

## 2021-12-13 NOTE — Assessment & Plan Note (Signed)
She has borderline iron deficiency that could be contributing to her anemia, in addition to anemia chronic illness secondary to diabetes and hydroxyurea

## 2021-12-13 NOTE — Progress Notes (Signed)
[facility] OFFICE PROGRESS NOTE  Patient Care Team: [name], MD as PCP - General Barbaraann Rondo, MD as Attending Physician (Obstetrics and Gynecology) Heath Lark, MD as Consulting Physician (Hematology and Oncology) Debbora Dus, [facility] as Pharmacist (Pharmacist)  ASSESSMENT & PLAN:  Essential thrombocythemia Her platelet count is well controlled, occasionally fluctuate up and down but remain in the 400 range Her anemia is stable I do not plan to change the dose of hydroxyurea She will also continue taking aspirin We discussed circumstances that could lead to severe bone marrow suppression such as infection and extreme stress and in those situation, she is instructed to call me In some circumstances, we might have to hold hydroxyurea for several days to allow recovery of her bone marrow function  Anemia in neoplastic disease She has borderline iron deficiency that could be contributing to her anemia, in addition to anemia chronic illness secondary to diabetes and hydroxyurea   Preventive measure She appears motivated to continue on her exercise journey I continue to encourage her  No orders of the defined types were placed in this encounter.   All questions were answered. The patient knows to call the clinic with any problems, questions or concerns. The total time spent in the appointment was 20 minutes encounter with patients including review of chart and various tests results, discussions about plan of care and coordination of care plan   Heath Lark, MD 12/13/2021 8:29 AM  INTERVAL HISTORY: Please see below for problem oriented charting. she returns for treatment follow-up on hydroxyurea for myeloproliferative disorder She is doing well She denies missing doses She has started to go to the gym to exercise on a regular basis  REVIEW OF SYSTEMS:   Constitutional: Denies fevers, chills or abnormal weight loss Eyes: Denies blurriness of vision Ears,  nose, mouth, throat, and face: Denies mucositis or sore throat Respiratory: Denies cough, dyspnea or wheezes Cardiovascular: Denies palpitation, chest discomfort or lower extremity swelling Gastrointestinal:  Denies nausea, heartburn or change in bowel habits Skin: Denies abnormal skin rashes Lymphatics: Denies new lymphadenopathy or easy bruising Neurological:Denies numbness, tingling or new weaknesses Behavioral/Psych: Mood is stable, no new changes  All other systems were reviewed with the patient and are negative.  I have reviewed the past medical history, past surgical history, social history and family history with the patient and they are unchanged from previous note.  ALLERGIES:  has No Known Allergies.  MEDICATIONS:  Current Outpatient Medications  Medication Sig Dispense Refill   Ascorbic Acid (VITAMIN C) 500 MG tablet Take 500 mg by mouth daily.       aspirin 81 MG tablet Take 81 mg by mouth daily.       calcium-vitamin D (OSCAL) 250-125 MG-UNIT per tablet Take 1 tablet by mouth daily.       cetirizine (ZYRTEC) 10 MG tablet Take 10 mg by mouth daily as needed for allergies.      ferrous sulfate 325 (65 FE) MG tablet Take 325 mg by mouth daily with breakfast.     fish oil-omega-3 fatty acids 1000 MG capsule Take 1 g by mouth daily.      glipiZIDE (GLUCOTROL) 5 MG tablet TAKE 1 TABLET (5 MG TOTAL) BY MOUTH 2 (TWO) TIMES DAILY BEFORE A MEAL. 180 tablet 3   hydroxyurea (HYDREA) 500 MG capsule TAKE 1 CAPSULE BY MOUTH EVERY DAY 90 capsule 1   metFORMIN (GLUCOPHAGE) 1000 MG tablet TAKE 1 TABLET BY MOUTH TWICE A DAY W/ A  MEAL 180 tablet 3   Multiple Vitamin (MULTIVITAMIN) tablet Take 1 tablet by mouth daily.       omeprazole (PRILOSEC OTC) 20 MG tablet Take 20 mg by mouth daily.      simvastatin (ZOCOR) 10 MG tablet TAKE 1 TABLET BY MOUTH EVERY DAY 90 tablet 3   TRULICITY 1.5 LM/7.8ML SOPN INJECT 1.5 MG INTO THE SKIN ONCE A WEEK. 2 mL 12   No current facility-administered  medications for this visit.    SUMMARY OF ONCOLOGIC HISTORY: Oncology History  Essential thrombocythemia ([place])  10/11/2012 Initial Diagnosis   She was evaluated for abnormal CBC   10/14/2012 Bone Marrow Biopsy   Bone Marrow, Aspirate,Biopsy, and Clot, right iliac - VARIABLY CELLULAR BONE MARROW FOR AGE WITH ATYPICAL MEGAKARYOCYTIC PROLIFERATION - SEE COMMENT. PERIPHERAL BLOOD: - NORMOCYTIC-NORMOCHROMIC ANEMIA. - THROMBOCYTOSIS. Diagnosis Note The bone marrow material is limited with lack of aspirate or adequate touch imprints for cytomorphologic evaluation. Nonetheless, there is an atypical megakaryocytic proliferation in the core biopsy worrisome for a myeloproliferative neoplasm (chronic myeloproliferative disorder) particularly essential thrombocythemia. Correlation with cytogenetic studies is recommended. BCR/ABl is neg   10/21/2012 Initial Diagnosis   Essential thrombocythemia ([place])   12/13/2012 -  Chemotherapy   On 01/08/2014, dose of hydroxyurea was modified to take Hydrea,500 mg 6 days a week and skip on Sundays. Since September 2015, she reduce hydroxyurea by skipping doses on Wednesday and Sundays On 05/04/2014, I reduce hydroxyurea dose further to 500 mg 4 days a week and to add anagrelide. Due to difficulties obtaining the pills, she had not been able to take hydroxyurea and anagrelide. On 06/04/14: dose of hydroxyurea is increased to 500 mg daily. In 2019, the dose of Hydrea was increased to 500 mg daily except on Saturdays and Sundays she take 1000 mg On April 30, 2020, the dose of hydroxyurea is increased to 1000 mg on Mondays, Wednesdays and Fridays and to take 500 mg for the rest of the week      PHYSICAL EXAMINATION: ECOG PERFORMANCE STATUS: 0 - Asymptomatic  Vitals:   12/13/21 0819  BP: (!) 158/76  Pulse: 90  Resp: 18  Temp: 97.9 F (36.6 C)  SpO2: 100%   Filed Weights   12/13/21 0819  Weight: 154 lb 12.8 oz (70.2 kg)    GENERAL:alert, no distress  and comfortable NEURO: alert & oriented x 3 with fluent speech, no focal motor/sensory deficits  LABORATORY DATA:  I have reviewed the data as listed    Component Value Date/Time   NA 135 01/14/2021 1024   NA 136 03/31/2013 1414   K 4.7 01/14/2021 1024   K 4.0 03/31/2013 1414   CL 99 01/14/2021 1024   CO2 29 01/14/2021 1024   CO2 24 03/31/2013 1414   GLUCOSE 129 (H) 01/14/2021 1024   GLUCOSE 178 (H) 03/31/2013 1414   BUN 9 01/14/2021 1024   BUN 8.1 03/31/2013 1414   CREATININE 0.65 01/14/2021 1024   CREATININE 0.7 03/31/2013 1414   CALCIUM 9.8 01/14/2021 1024   CALCIUM 10.1 03/31/2013 1414   PROT 7.7 01/14/2021 1024   PROT 7.2 03/31/2013 1414   ALBUMIN 4.4 01/14/2021 1024   ALBUMIN 3.9 03/31/2013 1414   AST 20 01/14/2021 1024   AST 19 03/31/2013 1414   ALT 16 01/14/2021 1024   ALT 15 03/31/2013 1414   ALKPHOS 42 01/14/2021 1024   ALKPHOS 55 03/31/2013 1414   BILITOT 0.5 01/14/2021 1024   BILITOT 0.28 03/31/2013 1414   GFRNONAA 112.68 10/29/2009  1221    No results found for: "SPEP", "UPEP"  Lab Results  Component Value Date   WBC 4.8 12/13/2021   NEUTROABS 2.4 12/13/2021   HGB 11.6 (L) 12/13/2021   HCT 33.0 (L) 12/13/2021   MCV 105.1 (H) 12/13/2021   PLT 467 (H) 12/13/2021      Chemistry      Component Value Date/Time   NA 135 01/14/2021 1024   NA 136 03/31/2013 1414   K 4.7 01/14/2021 1024   K 4.0 03/31/2013 1414   CL 99 01/14/2021 1024   CO2 29 01/14/2021 1024   CO2 24 03/31/2013 1414   BUN 9 01/14/2021 1024   BUN 8.1 03/31/2013 1414   CREATININE 0.65 01/14/2021 1024   CREATININE 0.7 03/31/2013 1414      Component Value Date/Time   CALCIUM 9.8 01/14/2021 1024   CALCIUM 10.1 03/31/2013 1414   ALKPHOS 42 01/14/2021 1024   ALKPHOS 55 03/31/2013 1414   AST 20 01/14/2021 1024   AST 19 03/31/2013 1414   ALT 16 01/14/2021 1024   ALT 15 03/31/2013 1414   BILITOT 0.5 01/14/2021 1024   BILITOT 0.28 03/31/2013 1414

## 2021-12-13 NOTE — Assessment & Plan Note (Signed)
Her platelet count is well controlled, occasionally fluctuate up and down but remain in the 400 range Her anemia is stable I do not plan to change the dose of hydroxyurea She will also continue taking aspirin We discussed circumstances that could lead to severe bone marrow suppression such as infection and extreme stress and in those situation, she is instructed to call me In some circumstances, we might have to hold hydroxyurea for several days to allow recovery of her bone marrow function

## 2021-12-13 NOTE — Assessment & Plan Note (Signed)
She appears motivated to continue on her exercise journey I continue to encourage her

## 2022-01-17 ENCOUNTER — Encounter: Payer: Self-pay | Admitting: Internal Medicine

## 2022-01-17 ENCOUNTER — Ambulatory Visit (INDEPENDENT_AMBULATORY_CARE_PROVIDER_SITE_OTHER): Payer: Medicare Other | Admitting: Internal Medicine

## 2022-01-17 VITALS — BP 138/84 | HR 86 | Temp 97.2°F | Ht 63.5 in | Wt 154.0 lb

## 2022-01-17 DIAGNOSIS — D473 Essential (hemorrhagic) thrombocythemia: Secondary | ICD-10-CM

## 2022-01-17 DIAGNOSIS — E785 Hyperlipidemia, unspecified: Secondary | ICD-10-CM

## 2022-01-17 DIAGNOSIS — E1159 Type 2 diabetes mellitus with other circulatory complications: Secondary | ICD-10-CM | POA: Diagnosis not present

## 2022-01-17 DIAGNOSIS — Z0000 Encounter for general adult medical examination without abnormal findings: Secondary | ICD-10-CM

## 2022-01-17 DIAGNOSIS — I10 Essential (primary) hypertension: Secondary | ICD-10-CM

## 2022-01-17 DIAGNOSIS — I471 Supraventricular tachycardia: Secondary | ICD-10-CM | POA: Diagnosis not present

## 2022-01-17 LAB — COMPREHENSIVE METABOLIC PANEL
ALT: 16 U/L (ref 0–35)
AST: 16 U/L (ref 0–37)
Albumin: 4.2 g/dL (ref 3.5–5.2)
Alkaline Phosphatase: 46 U/L (ref 39–117)
BUN: 9 mg/dL (ref 6–23)
CO2: 29 mEq/L (ref 19–32)
Calcium: 9.9 mg/dL (ref 8.4–10.5)
Chloride: 99 mEq/L (ref 96–112)
Creatinine, Ser: 0.66 mg/dL (ref 0.40–1.20)
GFR: 87.71 mL/min (ref >60.00)
Glucose, Bld: 188 mg/dL — ABNORMAL HIGH (ref 70–99)
Potassium: 5.1 mEq/L (ref 3.5–5.1)
Sodium: 136 mEq/L (ref 135–145)
Total Bilirubin: 0.4 mg/dL (ref 0.2–1.2)
Total Protein: 7.2 g/dL (ref 6.0–8.3)

## 2022-01-17 LAB — LIPID PANEL
Cholesterol: 136 mg/dL (ref 0–200)
HDL: 55.8 mg/dL (ref >39.00)
LDL Cholesterol: 51 mg/dL (ref 0–99)
NonHDL: 79.75
Total CHOL/HDL Ratio: 2
Triglycerides: 145 mg/dL (ref 0.0–149.0)
VLDL: 29 mg/dL (ref 0.0–40.0)

## 2022-01-17 LAB — TSH: TSH: 2.66 u[IU]/mL (ref 0.35–5.50)

## 2022-01-17 LAB — HM DIABETES FOOT EXAM

## 2022-01-17 LAB — MICROALBUMIN / CREATININE URINE RATIO
Creatinine,U: 82.9 mg/dL
Microalb Creat Ratio: 5.1 mg/g (ref 0.0–30.0)
Microalb, Ur: 4.2 mg/dL — ABNORMAL HIGH (ref 0.0–1.9)

## 2022-01-17 LAB — HEMOGLOBIN A1C: Hgb A1c MFr Bld: 7.9 % — ABNORMAL HIGH (ref 4.6–6.5)

## 2022-01-17 NOTE — Assessment & Plan Note (Signed)
Controlled on the hydroxyurea

## 2022-01-17 NOTE — Progress Notes (Signed)
Subjective:    Patient ID: Savannah Evans, female    DOB: 15-Dec-1950, 70 y.o.   MRN: [id number]  HPI Here for Medicare wellness visit and follow up of chronic health conditions Reviewed advanced directives Reviewed other doctors---Dr Gorsuch--hematology, Dr Lorre Munroe, Dr Alford Highland, Dr Maryellen Pile No hospitalizations or surgery this year No alcohol or tobacco Regular exercise No falls No depression or anhedonia Independent with instrumental ADLs No sig memory issues  Recent hematology visit Now at 6 month follow up Platelets consistently in the 400K range Mild anemia  No palpitations No chest pain No SOB No edema No dizziness or syncope  Checking sugars occasionally Fasting as high as 140's No hypoglycemia No foot numbness, burning or tingling  No heartburn or rarely Relates that to eating better No dysphagia  Current Outpatient Medications on File Prior to Visit  Medication Sig Dispense Refill   Ascorbic Acid (VITAMIN C) 500 MG tablet Take 500 mg by mouth daily.       aspirin 81 MG tablet Take 81 mg by mouth daily.       calcium-vitamin D (OSCAL) 250-125 MG-UNIT per tablet Take 1 tablet by mouth daily.       cetirizine (ZYRTEC) 10 MG tablet Take 10 mg by mouth daily as needed for allergies.      ferrous sulfate 325 (65 FE) MG tablet Take 325 mg by mouth daily with breakfast.     fish oil-omega-3 fatty acids 1000 MG capsule Take 1 g by mouth daily.      glipiZIDE (GLUCOTROL) 5 MG tablet TAKE 1 TABLET (5 MG TOTAL) BY MOUTH 2 (TWO) TIMES DAILY BEFORE A MEAL. 180 tablet 3   hydroxyurea (HYDREA) 500 MG capsule TAKE 1 CAPSULE BY MOUTH EVERY DAY 90 capsule 1   metFORMIN (GLUCOPHAGE) 1000 MG tablet TAKE 1 TABLET BY MOUTH TWICE A DAY W/ A MEAL 180 tablet 3   Multiple Vitamin (MULTIVITAMIN) tablet Take 1 tablet by mouth daily.       omeprazole (PRILOSEC OTC) 20 MG tablet Take 20 mg by mouth daily.      simvastatin (ZOCOR) 10 MG tablet TAKE 1 TABLET BY MOUTH  EVERY DAY 90 tablet 3   TRULICITY 1.5 SK/8.7GO SOPN INJECT 1.5 MG INTO THE SKIN ONCE A WEEK. 2 mL 12   No current facility-administered medications on file prior to visit.    No Known Allergies  Past Medical History:  Diagnosis Date   Allergic rhinitis    Anemia, unspecified 03/03/2013   Diabetes mellitus type 2, noninsulin dependent ([place])    Diaphragmatic hernia    Essential thrombocythemia ([place]) 10/21/2012   JAK2 neg; BCR-ABL neg; bone marrow biopsy on 09/29/12 showed normal cytogenetics.    GERD (gastroesophageal reflux disease)    History of palpitations    Hyperlipidemia    Iron deficiency    Osteoarthritis    SVT (supraventricular tachycardia) ([facility])     Past Surgical History:  Procedure Laterality Date   COLONOSCOPY     neg age 21.  Next age 50.    svt ablation  2000   Dr. Caryl Comes    Family History  Problem Relation Age of Onset   Heart attack Father    Uterine cancer Sister    Diabetes Sister    Macular degeneration Mother    Cancer Paternal Aunt        cancer?   Cancer Paternal Uncle        cancer?   Colon cancer Neg Hx  Breast cancer Neg Hx     Social History   Socioeconomic History   Marital status: Married    Spouse name: Not on file   Number of children: 3   Years of education: Not on file   Highest education level: Not on file  Occupational History   Occupation: Manages loan administration area for [employer]    Comment: Retired 2020  Tobacco Use   Smoking status: Never    Passive exposure: Past   Smokeless tobacco: Never  Substance and Sexual Activity   Alcohol use: Yes    Alcohol/week: 1.0 standard drink of alcohol    Types: 1 Glasses of wine per week    Comment: social   Drug use: No   Sexual activity: Not on file  Other Topics Concern   Not on file  Social History Narrative   Has living will   Requests husband as health care POA-- alternate is daughter Colletta [place]   Would accept resuscitation attempts but no prolonged  ventilation   No tube feeds if cognitively unaware   Social Determinants of Health   Financial Resource Strain: Not on file  Food Insecurity: Not on file  Transportation Needs: Not on file  Physical Activity: Not on file  Stress: Not on file  Social Connections: Not on file  Intimate Partner Violence: Not on file   Review of Systems Appetite is good Weight is stable Sleeps well Wears seat belt Teeth okay--keeps up with dentist No skin issues--no recent derm visit No sig back or joint pains Bowels are regular and no blood No dysuria or hematuria    Objective:   Physical Exam Constitutional:      Appearance: Normal appearance.  HENT:     Mouth/Throat:     Comments: No lesions Eyes:     Conjunctiva/sclera: Conjunctivae normal.     Pupils: Pupils are equal, round, and reactive to light.  Cardiovascular:     Rate and Rhythm: Normal rate and regular rhythm.     Pulses: Normal pulses.     Heart sounds: No murmur heard.    No gallop.  Pulmonary:     Effort: Pulmonary effort is normal.     Breath sounds: Normal breath sounds. No wheezing or rales.  Abdominal:     Palpations: Abdomen is soft.     Tenderness: There is no abdominal tenderness.  Musculoskeletal:     Cervical back: Neck supple.     Right lower leg: No edema.     Left lower leg: No edema.  Lymphadenopathy:     Cervical: No cervical adenopathy.  Skin:    Findings: No lesion or rash.  Neurological:     General: No focal deficit present.     Mental Status: She is alert and oriented to person, place, and time.     Comments: Mini--cog normal Sensation normal in feet  Psychiatric:        Mood and Affect: Mood normal.        Behavior: Behavior normal.            Assessment & Plan:

## 2022-01-17 NOTE — Assessment & Plan Note (Signed)
Seems to still have good control on the glipizide 5 bid, metformin 7615 bid and trulicity 1.'5mg'$  weekly

## 2022-01-17 NOTE — Assessment & Plan Note (Signed)
No symptoms of recurrence 

## 2022-01-17 NOTE — Assessment & Plan Note (Signed)
I have personally reviewed the Medicare Annual Wellness questionnaire and have noted 1. The patient's medical and social history 2. Their use of alcohol, tobacco or illicit drugs 3. Their current medications and supplements 4. The patient's functional ability including ADL's, fall risks, home safety risks and hearing or visual             impairment. 5. Diet and physical activities 6. Evidence for depression or mood disorders  The patients weight, height, BMI and visual acuity have been recorded in the chart I have made referrals, counseling and provided education to the patient based review of the above and I have provided the pt with a written personalized care plan for preventive services.  I have provided you with a copy of your personalized plan for preventive services. Please take the time to review along with your updated medication list.  Colonoscopy due next year Mammogram every 1-2 years--had this year Done with paps Exercises regularly Td at the pharmacy Considering updated COVID and flu vaccines--not excited about them

## 2022-01-17 NOTE — Assessment & Plan Note (Signed)
BP Readings from Last 3 Encounters:  01/17/22 138/84  12/13/21 (!) 158/76  08/09/21 (!) 152/65   Continues okay without medication

## 2022-01-17 NOTE — Assessment & Plan Note (Signed)
No problems with simvastatin '10mg'$ 

## 2022-01-17 NOTE — Progress Notes (Signed)
Hearing Screening - Comments:: Passed whisper test Vision Screening - Comments:: October 2022

## 2022-01-20 ENCOUNTER — Other Ambulatory Visit: Payer: Self-pay

## 2022-01-20 MED ORDER — TRULICITY 3 MG/0.5ML ~~LOC~~ SOAJ: 3.0000 mg | SUBCUTANEOUS | 11 refills | Status: DC

## 2022-02-23 DIAGNOSIS — E119 Type 2 diabetes mellitus without complications: Secondary | ICD-10-CM | POA: Diagnosis not present

## 2022-03-07 ENCOUNTER — Other Ambulatory Visit: Payer: Self-pay | Admitting: Internal Medicine

## 2022-04-19 ENCOUNTER — Other Ambulatory Visit: Payer: Self-pay | Admitting: Internal Medicine

## 2022-04-27 ENCOUNTER — Other Ambulatory Visit: Payer: Self-pay | Admitting: Hematology and Oncology

## 2022-06-15 ENCOUNTER — Encounter: Payer: Self-pay | Admitting: Hematology and Oncology

## 2022-06-15 ENCOUNTER — Inpatient Hospital Stay: Payer: Medicare Other

## 2022-06-15 ENCOUNTER — Inpatient Hospital Stay: Payer: Medicare Other | Attending: Hematology and Oncology | Admitting: Hematology and Oncology

## 2022-06-15 ENCOUNTER — Other Ambulatory Visit: Payer: Self-pay

## 2022-06-15 VITALS — BP 148/64 | HR 80 | Temp 97.4°F | Resp 18 | Ht 63.5 in | Wt 152.4 lb

## 2022-06-15 DIAGNOSIS — Z7982 Long term (current) use of aspirin: Secondary | ICD-10-CM | POA: Diagnosis not present

## 2022-06-15 DIAGNOSIS — I10 Essential (primary) hypertension: Secondary | ICD-10-CM | POA: Insufficient documentation

## 2022-06-15 DIAGNOSIS — D638 Anemia in other chronic diseases classified elsewhere: Secondary | ICD-10-CM | POA: Diagnosis not present

## 2022-06-15 DIAGNOSIS — D473 Essential (hemorrhagic) thrombocythemia: Secondary | ICD-10-CM

## 2022-06-15 DIAGNOSIS — Z79899 Other long term (current) drug therapy: Secondary | ICD-10-CM | POA: Insufficient documentation

## 2022-06-15 DIAGNOSIS — D539 Nutritional anemia, unspecified: Secondary | ICD-10-CM | POA: Insufficient documentation

## 2022-06-15 DIAGNOSIS — E119 Type 2 diabetes mellitus without complications: Secondary | ICD-10-CM | POA: Insufficient documentation

## 2022-06-15 LAB — CBC WITH DIFFERENTIAL/PLATELET
Abs Immature Granulocytes: 0.01 10*3/uL (ref 0.00–0.07)
Basophils Absolute: 0.1 10*3/uL (ref 0.0–0.1)
Basophils Relative: 1 %
Eosinophils Absolute: 0.1 10*3/uL (ref 0.0–0.5)
Eosinophils Relative: 2 %
HCT: 31.5 % — ABNORMAL LOW (ref 36.0–46.0)
Hemoglobin: 11.1 g/dL — ABNORMAL LOW (ref 12.0–15.0)
Immature Granulocytes: 0 %
Lymphocytes Relative: 39 %
Lymphs Abs: 2 10*3/uL (ref 0.7–4.0)
MCH: 37.1 pg — ABNORMAL HIGH (ref 26.0–34.0)
MCHC: 35.2 g/dL (ref 30.0–36.0)
MCV: 105.4 fL — ABNORMAL HIGH (ref 80.0–100.0)
Monocytes Absolute: 0.6 10*3/uL (ref 0.1–1.0)
Monocytes Relative: 11 %
Neutro Abs: 2.3 10*3/uL (ref 1.7–7.7)
Neutrophils Relative %: 47 %
Platelets: 500 10*3/uL — ABNORMAL HIGH (ref 150–400)
RBC: 2.99 MIL/uL — ABNORMAL LOW (ref 3.87–5.11)
RDW: 12.1 % (ref 11.5–15.5)
WBC: 5.1 10*3/uL (ref 4.0–10.5)
nRBC: 0 % (ref 0.0–0.2)

## 2022-06-15 MED ORDER — HYDROXYUREA 500 MG PO CAPS: ORAL_CAPSULE | ORAL | 1 refills | Status: DC

## 2022-06-15 NOTE — Assessment & Plan Note (Signed)
Her CBC showed stable anemia Platelet count is a little higher but it could be due to hemoconcentration as the patient has not eaten or drink much liquid this morning I do not plan to change the dose of hydroxyurea She will also continue taking aspirin I plan to see her again in 6 months for further follow-up and recommend increase oral fluid hydration prior to her next visit We discussed circumstances that could lead to severe bone marrow suppression such as infection and extreme stress and in those situation, she is instructed to call me In some circumstances, we might have to hold hydroxyurea for several days to allow recovery of her bone marrow function

## 2022-06-15 NOTE — Assessment & Plan Note (Signed)
She has borderline iron deficiency that could be contributing to her anemia, in addition to anemia chronic illness secondary to diabetes and hydroxyurea I recommend a very low-dose oral iron which she takes every other day and she tolerated that very well She does not need B12 supplement

## 2022-06-15 NOTE — Progress Notes (Signed)
[place] OFFICE PROGRESS NOTE  Patient Care Team: [name], MD as PCP - General Barbaraann Rondo, MD as Attending Physician (Obstetrics and Gynecology) Heath Lark, MD as Consulting Physician (Hematology and Oncology) Debbora Dus, [facility] as Pharmacist (Pharmacist)  ASSESSMENT & PLAN:  Essential thrombocythemia Her CBC showed stable anemia Platelet count is a little higher but it could be due to hemoconcentration as the patient has not eaten or drink much liquid this morning I do not plan to change the dose of hydroxyurea She will also continue taking aspirin I plan to see her again in 6 months for further follow-up and recommend increase oral fluid hydration prior to her next visit We discussed circumstances that could lead to severe bone marrow suppression such as infection and extreme stress and in those situation, she is instructed to call me In some circumstances, we might have to hold hydroxyurea for several days to allow recovery of her bone marrow function  Deficiency anemia She has borderline iron deficiency that could be contributing to her anemia, in addition to anemia chronic illness secondary to diabetes and hydroxyurea I recommend a very low-dose oral iron which she takes every other day and she tolerated that very well She does not need B12 supplement  No orders of the defined types were placed in this encounter.   All questions were answered. The patient knows to call the clinic with any problems, questions or concerns. The total time spent in the appointment was 20 minutes encounter with patients including review of chart and various tests results, discussions about plan of care and coordination of care plan   Heath Lark, MD 06/15/2022 12:12 PM  INTERVAL HISTORY: Please see below for problem oriented charting. she returns for treatment follow-up on hydroxyurea She is doing well She is compliant taking medications as directed No recent  infection  REVIEW OF SYSTEMS:   Constitutional: Denies fevers, chills or abnormal weight loss Eyes: Denies blurriness of vision Ears, nose, mouth, throat, and face: Denies mucositis or sore throat Respiratory: Denies cough, dyspnea or wheezes Cardiovascular: Denies palpitation, chest discomfort or lower extremity swelling Gastrointestinal:  Denies nausea, heartburn or change in bowel habits Skin: Denies abnormal skin rashes Lymphatics: Denies new lymphadenopathy or easy bruising Neurological:Denies numbness, tingling or new weaknesses Behavioral/Psych: Mood is stable, no new changes  All other systems were reviewed with the patient and are negative.  I have reviewed the past medical history, past surgical history, social history and family history with the patient and they are unchanged from previous note.  ALLERGIES:  has No Known Allergies.  MEDICATIONS:  Current Outpatient Medications  Medication Sig Dispense Refill   Ascorbic Acid (VITAMIN C) 500 MG tablet Take 500 mg by mouth daily.       aspirin 81 MG tablet Take 81 mg by mouth daily.       calcium-vitamin D (OSCAL) 250-125 MG-UNIT per tablet Take 1 tablet by mouth daily.       cetirizine (ZYRTEC) 10 MG tablet Take 10 mg by mouth daily as needed for allergies.      Dulaglutide (TRULICITY) 3 RN/1.6FB SOPN Inject 3 mg as directed once a week. 2 mL 11   ferrous sulfate 325 (65 FE) MG tablet Take 325 mg by mouth daily with breakfast.     fish oil-omega-3 fatty acids 1000 MG capsule Take 1 g by mouth daily.      glipiZIDE (GLUCOTROL) 5 MG tablet TAKE 1 TABLET (5 MG TOTAL) BY MOUTH  2 (TWO) TIMES DAILY BEFORE A MEAL. 180 tablet 3   hydroxyurea (HYDREA) 500 MG capsule TAKE 2 CAPSULES ON MONDAYS, WEDNESDAYS AND FRIDAYS, 1 CAPSULE REST OF THE WEEK 120 capsule 1   metFORMIN (GLUCOPHAGE) 1000 MG tablet TAKE 1 TABLET BY MOUTH TWICE A DAY W/ A MEAL 180 tablet 3   Multiple Vitamin (MULTIVITAMIN) tablet Take 1 tablet by mouth daily.        omeprazole (PRILOSEC OTC) 20 MG tablet Take 20 mg by mouth daily.      simvastatin (ZOCOR) 10 MG tablet TAKE 1 TABLET BY MOUTH EVERY DAY 90 tablet 3   No current facility-administered medications for this visit.    SUMMARY OF ONCOLOGIC HISTORY: Oncology History  Essential thrombocythemia ([place])  10/11/2012 Initial Diagnosis   She was evaluated for abnormal CBC   10/14/2012 Bone Marrow Biopsy   Bone Marrow, Aspirate,Biopsy, and Clot, right iliac - VARIABLY CELLULAR BONE MARROW FOR AGE WITH ATYPICAL MEGAKARYOCYTIC PROLIFERATION - SEE COMMENT. PERIPHERAL BLOOD: - NORMOCYTIC-NORMOCHROMIC ANEMIA. - THROMBOCYTOSIS. Diagnosis Note The bone marrow material is limited with lack of aspirate or adequate touch imprints for cytomorphologic evaluation. Nonetheless, there is an atypical megakaryocytic proliferation in the core biopsy worrisome for a myeloproliferative neoplasm (chronic myeloproliferative disorder) particularly essential thrombocythemia. Correlation with cytogenetic studies is recommended. BCR/ABl is neg   10/21/2012 Initial Diagnosis   Essential thrombocythemia ([place])   12/13/2012 -  Chemotherapy   On 01/08/2014, dose of hydroxyurea was modified to take Hydrea,500 mg 6 days a week and skip on Sundays. Since September 2015, she reduce hydroxyurea by skipping doses on Wednesday and Sundays On 05/04/2014, I reduce hydroxyurea dose further to 500 mg 4 days a week and to add anagrelide. Due to difficulties obtaining the pills, she had not been able to take hydroxyurea and anagrelide. On 06/04/14: dose of hydroxyurea is increased to 500 mg daily. In 2019, the dose of Hydrea was increased to 500 mg daily except on Saturdays and Sundays she take 1000 mg On April 30, 2020, the dose of hydroxyurea is increased to 1000 mg on Mondays, Wednesdays and Fridays and to take 500 mg for the rest of the week      PHYSICAL EXAMINATION: ECOG PERFORMANCE STATUS: 0 - Asymptomatic  Vitals:   06/15/22  0812  BP: (!) 148/64  Pulse: 80  Resp: 18  Temp: (!) 97.4 F (36.3 C)  SpO2: 98%   Filed Weights   06/15/22 0812  Weight: 152 lb 6.4 oz (69.1 kg)    GENERAL:alert, no distress and comfortable  NEURO: alert & oriented x 3 with fluent speech, no focal motor/sensory deficits  LABORATORY DATA:  I have reviewed the data as listed    Component Value Date/Time   NA 136 01/17/2022 0954   NA 136 03/31/2013 1414   K 5.1 01/17/2022 0954   K 4.0 03/31/2013 1414   CL 99 01/17/2022 0954   CO2 29 01/17/2022 0954   CO2 24 03/31/2013 1414   GLUCOSE 188 (H) 01/17/2022 0954   GLUCOSE 178 (H) 03/31/2013 1414   BUN 9 01/17/2022 0954   BUN 8.1 03/31/2013 1414   CREATININE 0.66 01/17/2022 0954   CREATININE 0.7 03/31/2013 1414   CALCIUM 9.9 01/17/2022 0954   CALCIUM 10.1 03/31/2013 1414   PROT 7.2 01/17/2022 0954   PROT 7.2 03/31/2013 1414   ALBUMIN 4.2 01/17/2022 0954   ALBUMIN 3.9 03/31/2013 1414   AST 16 01/17/2022 0954   AST 19 03/31/2013 1414   ALT 16 01/17/2022 0954  ALT 15 03/31/2013 1414   ALKPHOS 46 01/17/2022 0954   ALKPHOS 55 03/31/2013 1414   BILITOT 0.4 01/17/2022 0954   BILITOT 0.28 03/31/2013 1414   GFRNONAA 112.68 10/29/2009 1221    No results found for: "SPEP", "UPEP"  Lab Results  Component Value Date   WBC 5.1 06/15/2022   NEUTROABS 2.3 06/15/2022   HGB 11.1 (L) 06/15/2022   HCT 31.5 (L) 06/15/2022   MCV 105.4 (H) 06/15/2022   PLT 500 (H) 06/15/2022      Chemistry      Component Value Date/Time   NA 136 01/17/2022 0954   NA 136 03/31/2013 1414   K 5.1 01/17/2022 0954   K 4.0 03/31/2013 1414   CL 99 01/17/2022 0954   CO2 29 01/17/2022 0954   CO2 24 03/31/2013 1414   BUN 9 01/17/2022 0954   BUN 8.1 03/31/2013 1414   CREATININE 0.66 01/17/2022 0954   CREATININE 0.7 03/31/2013 1414      Component Value Date/Time   CALCIUM 9.9 01/17/2022 0954   CALCIUM 10.1 03/31/2013 1414   ALKPHOS 46 01/17/2022 0954   ALKPHOS 55 03/31/2013 1414   AST 16  01/17/2022 0954   AST 19 03/31/2013 1414   ALT 16 01/17/2022 0954   ALT 15 03/31/2013 1414   BILITOT 0.4 01/17/2022 0954   BILITOT 0.28 03/31/2013 1414

## 2022-07-18 ENCOUNTER — Ambulatory Visit: Payer: Medicare Other | Admitting: Internal Medicine

## 2022-07-25 ENCOUNTER — Ambulatory Visit (INDEPENDENT_AMBULATORY_CARE_PROVIDER_SITE_OTHER): Payer: Medicare Other | Admitting: Internal Medicine

## 2022-07-25 ENCOUNTER — Encounter: Payer: Self-pay | Admitting: Internal Medicine

## 2022-07-25 VITALS — BP 138/80 | HR 120 | Temp 97.9°F | Ht 63.5 in | Wt 150.0 lb

## 2022-07-25 DIAGNOSIS — I10 Essential (primary) hypertension: Secondary | ICD-10-CM

## 2022-07-25 DIAGNOSIS — E1159 Type 2 diabetes mellitus with other circulatory complications: Secondary | ICD-10-CM | POA: Diagnosis not present

## 2022-07-25 LAB — POCT GLYCOSYLATED HEMOGLOBIN (HGB A1C): Hemoglobin A1C: 7.2 % — AB (ref 4.0–5.6)

## 2022-07-25 NOTE — Assessment & Plan Note (Signed)
BP Readings from Last 3 Encounters:  07/25/22 138/80  06/15/22 (!) 148/64  01/17/22 138/84   Controlled without meds

## 2022-07-25 NOTE — Progress Notes (Signed)
Subjective:    Patient ID: Savannah Evans, female    DOB: 02-15-1951, 72 y.o.   MRN: [id number]  HPI Here for follow up of diabetes  Doing well Checks sugars regularly---almost all under 140 No low sugar reactions  Is up to '3mg'$  of the trulicity---due to sugars being up some before  Still works out regularly at the [employer] to be more active once the weather improves  Current Outpatient Medications on File Prior to Visit  Medication Sig Dispense Refill   Ascorbic Acid (VITAMIN C) 500 MG tablet Take 500 mg by mouth daily.       aspirin 81 MG tablet Take 81 mg by mouth daily.       calcium-vitamin D (OSCAL) 250-125 MG-UNIT per tablet Take 1 tablet by mouth daily.       cetirizine (ZYRTEC) 10 MG tablet Take 10 mg by mouth daily as needed for allergies.      Dulaglutide (TRULICITY) 3 0000000 SOPN Inject 3 mg as directed once a week. 2 mL 11   ferrous sulfate 325 (65 FE) MG tablet Take 325 mg by mouth daily with breakfast.     fish oil-omega-3 fatty acids 1000 MG capsule Take 1 g by mouth daily.      glipiZIDE (GLUCOTROL) 5 MG tablet TAKE 1 TABLET (5 MG TOTAL) BY MOUTH 2 (TWO) TIMES DAILY BEFORE A MEAL. 180 tablet 3   hydroxyurea (HYDREA) 500 MG capsule TAKE 2 CAPSULES ON MONDAYS, WEDNESDAYS AND FRIDAYS, 1 CAPSULE REST OF THE WEEK 120 capsule 1   metFORMIN (GLUCOPHAGE) 1000 MG tablet TAKE 1 TABLET BY MOUTH TWICE A DAY W/ A MEAL 180 tablet 3   Multiple Vitamin (MULTIVITAMIN) tablet Take 1 tablet by mouth daily.       omeprazole (PRILOSEC OTC) 20 MG tablet Take 20 mg by mouth daily.      simvastatin (ZOCOR) 10 MG tablet TAKE 1 TABLET BY MOUTH EVERY DAY 90 tablet 3   No current facility-administered medications on file prior to visit.    No Known Allergies  Past Medical History:  Diagnosis Date   Allergic rhinitis    Anemia, unspecified 03/03/2013   Diabetes mellitus type 2, noninsulin dependent ([place])    Diaphragmatic hernia    Essential thrombocythemia ([place]) 10/21/2012   JAK2  neg; BCR-ABL neg; bone marrow biopsy on 09/29/12 showed normal cytogenetics.    GERD (gastroesophageal reflux disease)    History of palpitations    Hyperlipidemia    Iron deficiency    Osteoarthritis    SVT (supraventricular tachycardia)     Past Surgical History:  Procedure Laterality Date   COLONOSCOPY     neg age 51.  Next age 32.    svt ablation  2000   Dr. Caryl Comes    Family History  Problem Relation Age of Onset   Heart attack Father    Uterine cancer Sister    Diabetes Sister    Macular degeneration Mother    Cancer Paternal Aunt        cancer?   Cancer Paternal Uncle        cancer?   Colon cancer Neg Hx    Breast cancer Neg Hx     Social History   Socioeconomic History   Marital status: Married    Spouse name: Not on file   Number of children: 3   Years of education: Not on file   Highest education level: Not on file  Occupational History   Occupation: Manages  loan administration area for [employer]    Comment: Retired 2020  Tobacco Use   Smoking status: Never    Passive exposure: Past   Smokeless tobacco: Never  Substance and Sexual Activity   Alcohol use: Yes    Alcohol/week: 1.0 standard drink of alcohol    Types: 1 Glasses of wine per week    Comment: social   Drug use: No   Sexual activity: Not on file  Other Topics Concern   Not on file  Social History Narrative   Has living will   Requests husband as health care POA-- alternate is daughter Colletta [place]   Would accept resuscitation attempts but no prolonged ventilation   No tube feeds if cognitively unaware   Social Determinants of Health   Financial Resource Strain: Not on file  Food Insecurity: Not on file  Transportation Needs: Not on file  Physical Activity: Not on file  Stress: Not on file  Social Connections: Not on file  Intimate Partner Violence: Not on file   Review of Systems Weight is slightly down Sleeps well No chest pain or SOB    Objective:   Physical  Exam Constitutional:      Appearance: Normal appearance.  Cardiovascular:     Rate and Rhythm: Normal rate and regular rhythm.     Pulses: Normal pulses.     Heart sounds: No murmur heard.    No gallop.  Pulmonary:     Effort: Pulmonary effort is normal.     Breath sounds: Normal breath sounds. No wheezing or rales.  Musculoskeletal:     Cervical back: Neck supple.     Right lower leg: No edema.     Left lower leg: No edema.  Lymphadenopathy:     Cervical: No cervical adenopathy.  Skin:    Comments: No foot lesions  Neurological:     Mental Status: She is alert.            Assessment & Plan:

## 2022-07-25 NOTE — Assessment & Plan Note (Signed)
Doing well Lab Results  Component Value Date   HGBA1C 7.2 (A) 07/25/2022   Improved with increased trulicity-- '3mg'$  weekly, metformin 1000 bid and glipizide 5 bid

## 2022-08-24 ENCOUNTER — Other Ambulatory Visit: Payer: Self-pay | Admitting: Internal Medicine

## 2022-08-24 DIAGNOSIS — Z1231 Encounter for screening mammogram for malignant neoplasm of breast: Secondary | ICD-10-CM

## 2022-09-14 ENCOUNTER — Ambulatory Visit
Admission: RE | Admit: 2022-09-14 | Discharge: 2022-09-14 | Disposition: A | Discharge status: Home / Self Care | Payer: Medicare Other | Source: Ambulatory Visit | Attending: Internal Medicine | Admitting: Internal Medicine

## 2022-09-14 DIAGNOSIS — Z1231 Encounter for screening mammogram for malignant neoplasm of breast: Secondary | ICD-10-CM

## 2022-09-19 ENCOUNTER — Telehealth: Payer: Self-pay | Admitting: Internal Medicine

## 2022-09-19 NOTE — Telephone Encounter (Signed)
Pt called in to make PCP aware that [facility] do not have RX Dulaglutide (TRULICITY) 3 MG/0.5ML SOPN  that it is on back order and she is due for her next shot need to be advise as of what to do .  Requesting a call back # [phone]

## 2022-09-19 NOTE — Telephone Encounter (Signed)
Called and spoke to pt. She will contact [facility] and then let us know.

## 2022-09-19 NOTE — Telephone Encounter (Signed)
Pt called in stated [facility] do not have Trulicity but they have Ozempic . Would like a call back #[phone]

## 2022-09-20 MED ORDER — SEMAGLUTIDE (2 MG/DOSE) 8 MG/3ML ~~LOC~~ SOPN: 2.0000 mg | PEN_INJECTOR | SUBCUTANEOUS | 3 refills | Status: DC

## 2022-09-20 NOTE — Telephone Encounter (Signed)
Spoke to pt

## 2022-09-20 NOTE — Addendum Note (Signed)
Addended by: Tillman Abide I on: 09/20/2022 07:29 AM   Modules accepted: Orders

## 2022-09-20 NOTE — Telephone Encounter (Signed)
Let her know I sent a comparable dose (she was on the highest trulicity) Have her let me know if she has a reaction and we can try a lower dose

## 2022-10-09 ENCOUNTER — Other Ambulatory Visit: Payer: Self-pay | Admitting: Internal Medicine

## 2022-10-09 ENCOUNTER — Other Ambulatory Visit: Payer: Self-pay | Admitting: Hematology and Oncology

## 2022-10-27 ENCOUNTER — Other Ambulatory Visit: Payer: Self-pay

## 2022-10-27 MED ORDER — HYDROXYUREA 500 MG PO CAPS: 500.0000 mg | ORAL_CAPSULE | Freq: Every day | ORAL | 1 refills | Status: DC

## 2022-12-10 ENCOUNTER — Other Ambulatory Visit: Payer: Self-pay | Admitting: Internal Medicine

## 2022-12-14 ENCOUNTER — Inpatient Hospital Stay: Payer: Medicare Other | Attending: Hematology and Oncology

## 2022-12-14 ENCOUNTER — Encounter: Payer: Self-pay | Admitting: Hematology and Oncology

## 2022-12-14 ENCOUNTER — Telehealth: Payer: Self-pay | Admitting: Hematology and Oncology

## 2022-12-14 ENCOUNTER — Inpatient Hospital Stay (HOSPITAL_BASED_OUTPATIENT_CLINIC_OR_DEPARTMENT_OTHER): Payer: Medicare Other | Admitting: Hematology and Oncology

## 2022-12-14 ENCOUNTER — Other Ambulatory Visit: Payer: Self-pay

## 2022-12-14 VITALS — BP 145/62 | HR 89 | Temp 97.5°F | Resp 18 | Ht 63.5 in | Wt 144.4 lb

## 2022-12-14 DIAGNOSIS — D473 Essential (hemorrhagic) thrombocythemia: Secondary | ICD-10-CM | POA: Diagnosis not present

## 2022-12-14 DIAGNOSIS — D649 Anemia, unspecified: Secondary | ICD-10-CM | POA: Insufficient documentation

## 2022-12-14 DIAGNOSIS — Z7982 Long term (current) use of aspirin: Secondary | ICD-10-CM | POA: Insufficient documentation

## 2022-12-14 DIAGNOSIS — Z79899 Other long term (current) drug therapy: Secondary | ICD-10-CM | POA: Insufficient documentation

## 2022-12-14 LAB — CBC WITH DIFFERENTIAL/PLATELET
Abs Immature Granulocytes: 0.03 10*3/uL (ref 0.00–0.07)
Basophils Absolute: 0 10*3/uL (ref 0.0–0.1)
Basophils Relative: 1 %
Eosinophils Absolute: 0.1 10*3/uL (ref 0.0–0.5)
Eosinophils Relative: 1 %
HCT: 32.6 % — ABNORMAL LOW (ref 36.0–46.0)
Hemoglobin: 11.5 g/dL — ABNORMAL LOW (ref 12.0–15.0)
Immature Granulocytes: 1 %
Lymphocytes Relative: 35 %
Lymphs Abs: 2.2 10*3/uL (ref 0.7–4.0)
MCH: 37.2 pg — ABNORMAL HIGH (ref 26.0–34.0)
MCHC: 35.3 g/dL (ref 30.0–36.0)
MCV: 105.5 fL — ABNORMAL HIGH (ref 80.0–100.0)
Monocytes Absolute: 0.5 10*3/uL (ref 0.1–1.0)
Monocytes Relative: 8 %
Neutro Abs: 3.5 10*3/uL (ref 1.7–7.7)
Neutrophils Relative %: 54 %
Platelets: 525 10*3/uL — ABNORMAL HIGH (ref 150–400)
RBC: 3.09 MIL/uL — ABNORMAL LOW (ref 3.87–5.11)
RDW: 11.7 % (ref 11.5–15.5)
WBC: 6.3 10*3/uL (ref 4.0–10.5)
nRBC: 0 % (ref 0.0–0.2)

## 2022-12-14 MED ORDER — HYDROXYUREA 500 MG PO CAPS: ORAL_CAPSULE | ORAL | 1 refills | Status: DC

## 2022-12-14 NOTE — Telephone Encounter (Signed)
 Left patient a message regarding upcoming appointment times/dates

## 2022-12-14 NOTE — Progress Notes (Signed)
[facility] OFFICE PROGRESS NOTE  Patient Care Team: Karie Schwalbe, MD as PCP - General Luvenia Redden, MD as Attending Physician (Obstetrics and Gynecology) Artis Delay, MD as Consulting Physician (Hematology and Oncology) Vilinda Flake, [facility] (Inactive) as Pharmacist (Pharmacist)  ASSESSMENT & PLAN:  Essential thrombocythemia Her CBC showed stable anemia However, her platelet count is trending up I recommend changing the doses of hydroxyurea She will take 1000 mg 4 times a week and 500 mg on Mondays, Wednesdays and Fridays I will reassess in 3 months If she has appointment with her primary care doctor next month, she will inform me with results of his CBC and if the results are satisfactory, I might be able to space out the interval of her future appointment  No orders of the defined types were placed in this encounter.   All questions were answered. The patient knows to call the clinic with any problems, questions or concerns. The total time spent in the appointment was 25 minutes encounter with patients including review of chart and various tests results, discussions about plan of care and coordination of care plan   Artis Delay, MD 12/14/2022 9:48 AM  INTERVAL HISTORY: Please see below for problem oriented charting. she returns for treatment follow-up on hydroxyurea She has lost some weight since she was started on Ozempic She is compliant taking her medications as directed She has no recent infection or bleeding  REVIEW OF SYSTEMS:   Constitutional: Denies fevers, chills or abnormal weight loss Eyes: Denies blurriness of vision Ears, nose, mouth, throat, and face: Denies mucositis or sore throat Respiratory: Denies cough, dyspnea or wheezes Cardiovascular: Denies palpitation, chest discomfort or lower extremity swelling Gastrointestinal:  Denies nausea, heartburn or change in bowel habits Skin: Denies abnormal skin rashes Lymphatics: Denies new  lymphadenopathy or easy bruising Neurological:Denies numbness, tingling or new weaknesses Behavioral/Psych: Mood is stable, no new changes  All other systems were reviewed with the patient and are negative.  I have reviewed the past medical history, past surgical history, social history and family history with the patient and they are unchanged from previous note.  ALLERGIES:  has No Known Allergies.  MEDICATIONS:  Current Outpatient Medications  Medication Sig Dispense Refill   Ascorbic Acid (VITAMIN C) 500 MG tablet Take 500 mg by mouth daily.       aspirin 81 MG tablet Take 81 mg by mouth daily.       calcium-vitamin D (OSCAL) 250-125 MG-UNIT per tablet Take 1 tablet by mouth daily.       cetirizine (ZYRTEC) 10 MG tablet Take 10 mg by mouth daily as needed for allergies.      ferrous sulfate 325 (65 FE) MG tablet Take 325 mg by mouth daily with breakfast.     fish oil-omega-3 fatty acids 1000 MG capsule Take 1 g by mouth daily.      glipiZIDE (GLUCOTROL) 5 MG tablet TAKE 1 TABLET (5 MG TOTAL) BY MOUTH TWICE A DAY BEFORE MEALS 180 tablet 3   hydroxyurea (HYDREA) 500 MG capsule Take 2 pills daily except on Mondays, Wednesdays and Fridays take 1 pill 90 capsule 1   metFORMIN (GLUCOPHAGE) 1000 MG tablet TAKE 1 TABLET BY MOUTH TWICE A DAY W/ A MEAL 180 tablet 3   Multiple Vitamin (MULTIVITAMIN) tablet Take 1 tablet by mouth daily.       omeprazole (PRILOSEC OTC) 20 MG tablet Take 20 mg by mouth daily.      Semaglutide, 2 MG/DOSE,  8 MG/3ML SOPN Inject 2 mg as directed once a week. 9 mL 3   simvastatin (ZOCOR) 10 MG tablet TAKE 1 TABLET BY MOUTH EVERY DAY 90 tablet 3   No current facility-administered medications for this visit.    SUMMARY OF ONCOLOGIC HISTORY: Oncology History  Essential thrombocythemia ([facility])  10/11/2012 Initial Diagnosis   She was evaluated for abnormal CBC   10/14/2012 Bone Marrow Biopsy   Bone Marrow, Aspirate,Biopsy, and Clot, right iliac - VARIABLY CELLULAR BONE  MARROW FOR AGE WITH ATYPICAL MEGAKARYOCYTIC PROLIFERATION - SEE COMMENT. PERIPHERAL BLOOD: - NORMOCYTIC-NORMOCHROMIC ANEMIA. - THROMBOCYTOSIS. Diagnosis Note The bone marrow material is limited with lack of aspirate or adequate touch imprints for cytomorphologic evaluation. Nonetheless, there is an atypical megakaryocytic proliferation in the core biopsy worrisome for a myeloproliferative neoplasm (chronic myeloproliferative disorder) particularly essential thrombocythemia. Correlation with cytogenetic studies is recommended. BCR/ABl is neg   10/21/2012 Initial Diagnosis   Essential thrombocythemia ([facility])   12/13/2012 -  Chemotherapy   On 01/08/2014, dose of hydroxyurea was modified to take Hydrea,500 mg 6 days a week and skip on Sundays. Since September 2015, she reduce hydroxyurea by skipping doses on Wednesday and Sundays On 05/04/2014, I reduce hydroxyurea dose further to 500 mg 4 days a week and to add anagrelide. Due to difficulties obtaining the pills, she had not been able to take hydroxyurea and anagrelide. On 06/04/14: dose of hydroxyurea is increased to 500 mg daily. In 2019, the dose of Hydrea was increased to 500 mg daily except on Saturdays and Sundays she take 1000 mg On April 30, 2020, the dose of hydroxyurea is increased to 1000 mg on Mondays, Wednesdays and Fridays and to take 500 mg for the rest of the week On December 14, 2022, She will take hydroxyurea 1000 mg 4 times a week and 500 mg on Mondays, Wednesdays and Fridays      PHYSICAL EXAMINATION: ECOG PERFORMANCE STATUS: 0 - Asymptomatic  Vitals:   12/14/22 0813  BP: (!) 145/62  Pulse: 89  Resp: 18  Temp: (!) 97.5 F (36.4 C)  SpO2: 100%   Filed Weights   12/14/22 0813  Weight: 144 lb 6.4 oz (65.5 kg)    GENERAL:alert, no distress and comfortable NEURO: alert & oriented x 3 with fluent speech, no focal motor/sensory deficits  LABORATORY DATA:  I have reviewed the data as listed    Component Value  Date/Time   NA 136 01/17/2022 0954   NA 136 03/31/2013 1414   K 5.1 01/17/2022 0954   K 4.0 03/31/2013 1414   CL 99 01/17/2022 0954   CO2 29 01/17/2022 0954   CO2 24 03/31/2013 1414   GLUCOSE 188 (H) 01/17/2022 0954   GLUCOSE 178 (H) 03/31/2013 1414   BUN 9 01/17/2022 0954   BUN 8.1 03/31/2013 1414   CREATININE 0.66 01/17/2022 0954   CREATININE 0.7 03/31/2013 1414   CALCIUM 9.9 01/17/2022 0954   CALCIUM 10.1 03/31/2013 1414   PROT 7.2 01/17/2022 0954   PROT 7.2 03/31/2013 1414   ALBUMIN 4.2 01/17/2022 0954   ALBUMIN 3.9 03/31/2013 1414   AST 16 01/17/2022 0954   AST 19 03/31/2013 1414   ALT 16 01/17/2022 0954   ALT 15 03/31/2013 1414   ALKPHOS 46 01/17/2022 0954   ALKPHOS 55 03/31/2013 1414   BILITOT 0.4 01/17/2022 0954   BILITOT 0.28 03/31/2013 1414   GFRNONAA 112.68 10/29/2009 1221    No results found for: "SPEP", "UPEP"  Lab Results  Component Value Date  WBC 6.3 12/14/2022   NEUTROABS 3.5 12/14/2022   HGB 11.5 (L) 12/14/2022   HCT 32.6 (L) 12/14/2022   MCV 105.5 (H) 12/14/2022   PLT 525 (H) 12/14/2022      Chemistry      Component Value Date/Time   NA 136 01/17/2022 0954   NA 136 03/31/2013 1414   K 5.1 01/17/2022 0954   K 4.0 03/31/2013 1414   CL 99 01/17/2022 0954   CO2 29 01/17/2022 0954   CO2 24 03/31/2013 1414   BUN 9 01/17/2022 0954   BUN 8.1 03/31/2013 1414   CREATININE 0.66 01/17/2022 0954   CREATININE 0.7 03/31/2013 1414      Component Value Date/Time   CALCIUM 9.9 01/17/2022 0954   CALCIUM 10.1 03/31/2013 1414   ALKPHOS 46 01/17/2022 0954   ALKPHOS 55 03/31/2013 1414   AST 16 01/17/2022 0954   AST 19 03/31/2013 1414   ALT 16 01/17/2022 0954   ALT 15 03/31/2013 1414   BILITOT 0.4 01/17/2022 0954   BILITOT 0.28 03/31/2013 1414

## 2022-12-14 NOTE — Assessment & Plan Note (Signed)
Her CBC showed stable anemia However, her platelet count is trending up I recommend changing the doses of hydroxyurea She will take 1000 mg 4 times a week and 500 mg on Mondays, Wednesdays and Fridays I will reassess in 3 months If she has appointment with her primary care doctor next month, she will inform me with results of his CBC and if the results are satisfactory, I might be able to space out the interval of her future appointment

## 2022-12-28 ENCOUNTER — Encounter (INDEPENDENT_AMBULATORY_CARE_PROVIDER_SITE_OTHER): Payer: Self-pay

## 2023-01-03 ENCOUNTER — Other Ambulatory Visit: Payer: Self-pay | Admitting: Internal Medicine

## 2023-01-16 ENCOUNTER — Other Ambulatory Visit: Payer: Self-pay | Admitting: Internal Medicine

## 2023-01-25 ENCOUNTER — Ambulatory Visit: Payer: Medicare Other | Admitting: Internal Medicine

## 2023-02-06 ENCOUNTER — Ambulatory Visit (INDEPENDENT_AMBULATORY_CARE_PROVIDER_SITE_OTHER): Payer: Medicare Other | Admitting: Internal Medicine

## 2023-02-06 ENCOUNTER — Encounter: Payer: Self-pay | Admitting: Internal Medicine

## 2023-02-06 VITALS — BP 136/74 | HR 92 | Temp 97.3°F | Ht 63.5 in | Wt 142.0 lb

## 2023-02-06 DIAGNOSIS — E119 Type 2 diabetes mellitus without complications: Secondary | ICD-10-CM | POA: Insufficient documentation

## 2023-02-06 DIAGNOSIS — D473 Essential (hemorrhagic) thrombocythemia: Secondary | ICD-10-CM | POA: Diagnosis not present

## 2023-02-06 DIAGNOSIS — I10 Essential (primary) hypertension: Secondary | ICD-10-CM | POA: Diagnosis not present

## 2023-02-06 DIAGNOSIS — I471 Supraventricular tachycardia, unspecified: Secondary | ICD-10-CM

## 2023-02-06 DIAGNOSIS — Z7984 Long term (current) use of oral hypoglycemic drugs: Secondary | ICD-10-CM | POA: Diagnosis not present

## 2023-02-06 DIAGNOSIS — Z7985 Long-term (current) use of injectable non-insulin antidiabetic drugs: Secondary | ICD-10-CM | POA: Insufficient documentation

## 2023-02-06 DIAGNOSIS — E1159 Type 2 diabetes mellitus with other circulatory complications: Secondary | ICD-10-CM | POA: Diagnosis not present

## 2023-02-06 LAB — POCT GLYCOSYLATED HEMOGLOBIN (HGB A1C): Hemoglobin A1C: 6.1 % — AB (ref 4.0–5.6)

## 2023-02-06 LAB — HM DIABETES FOOT EXAM

## 2023-02-06 NOTE — Assessment & Plan Note (Signed)
Follows with hematology On hydroxyurea

## 2023-02-06 NOTE — Assessment & Plan Note (Signed)
BP Readings from Last 3 Encounters:  02/06/23 136/74  12/14/22 (!) 145/62  07/25/22 138/80   Controlled with no meds now

## 2023-02-06 NOTE — Assessment & Plan Note (Signed)
Lab Results  Component Value Date   HGBA1C 6.1 (A) 02/06/2023   Has done well with lifestyle improvements Ozempic 2mg  weekly, metformin 1000 bid glipizide 5 bid

## 2023-02-06 NOTE — Progress Notes (Signed)
Subjective:    Patient ID: Savannah Evans, female    DOB: [birth year], 72 y.o.   MRN: [id number]  HPI Here for follow up of diabetes and other chronic health conditions   Has noticed a knot in her chest----no pain Not clear if it is new Near right costo-clavicular border  Feeling good  Blood counts are okay-platelets did slightly increase hydrea recently  Watching her eating Doing exercise Has lost 8# since the last visit Checks sugars twice a month---all under 130 No foot numbness or burning--but some funny feeling by left neck/shoulder  No chest pain  No SOB No palpitations  Current Outpatient Medications on File Prior to Visit  Medication Sig Dispense Refill   Ascorbic Acid (VITAMIN C) 500 MG tablet Take 500 mg by mouth daily.       aspirin 81 MG tablet Take 81 mg by mouth daily.       calcium-vitamin D (OSCAL) 250-125 MG-UNIT per tablet Take 1 tablet by mouth daily.       cetirizine (ZYRTEC) 10 MG tablet Take 10 mg by mouth daily as needed for allergies.      ferrous sulfate 325 (65 FE) MG tablet Take 325 mg by mouth daily with breakfast.     fish oil-omega-3 fatty acids 1000 MG capsule Take 1 g by mouth daily.      glipiZIDE (GLUCOTROL) 5 MG tablet TAKE 1 TABLET (5 MG TOTAL) BY MOUTH TWICE A DAY BEFORE MEALS 180 tablet 3   hydroxyurea (HYDREA) 500 MG capsule Take 2 pills daily except on Mondays, Wednesdays and Fridays take 1 pill 90 capsule 1   metFORMIN (GLUCOPHAGE) 1000 MG tablet TAKE 1 TABLET BY MOUTH TWICE A DAY W/ A MEAL 180 tablet 3   Multiple Vitamin (MULTIVITAMIN) tablet Take 1 tablet by mouth daily.       omeprazole (PRILOSEC OTC) 20 MG tablet Take 20 mg by mouth daily.      Semaglutide, 2 MG/DOSE, (OZEMPIC, 2 MG/DOSE,) 8 MG/3ML SOPN INJECT 2 MG AS DIRECTED ONCE A WEEK. 9 mL 3   simvastatin (ZOCOR) 10 MG tablet TAKE 1 TABLET BY MOUTH EVERY DAY 90 tablet 3   No current facility-administered medications on file prior to visit.    No Known Allergies  Past  Medical History:  Diagnosis Date   Allergic rhinitis    Anemia, unspecified 03/03/2013   Diabetes mellitus type 2, noninsulin dependent (HCC)    Diaphragmatic hernia    Essential thrombocythemia (HCC) 10/21/2012   JAK2 neg; BCR-ABL neg; bone marrow biopsy on 09/29/12 showed normal cytogenetics.    GERD (gastroesophageal reflux disease)    History of palpitations    Hyperlipidemia    Iron deficiency    Osteoarthritis    SVT (supraventricular tachycardia)     Past Surgical History:  Procedure Laterality Date   COLONOSCOPY     neg age 54.  Next age 50.    svt ablation  2000   Dr. Graciela Husbands    Family History  Problem Relation Age of Onset   Heart attack Father    Uterine cancer Sister    Diabetes Sister    Macular degeneration Mother    Cancer Paternal Aunt        cancer?   Cancer Paternal Uncle        cancer?   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[employer]    Comment: Retired 2020  Tobacco Use   Smoking status: Never    Passive exposure: Past   Smokeless tobacco: Never  Substance and Sexual Activity   Alcohol use: Yes    Alcohol/week: 1.0 standard drink of alcohol    Types: 1 Glasses of wine per week    Comment: social   Drug use: No   Sexual activity: Not on file  Other Topics Concern   Not on file  Social History Narrative   Has living will   Requests husband as health care POA-- alternate is daughter Judeth Cornfield   Would accept resuscitation attempts but no prolonged ventilation   No tube feeds if cognitively unaware   Social Determinants of Health   Financial Resource Strain: Not on file  Food Insecurity: Not on file  Transportation Needs: Not on file  Physical Activity: Not  on file  Stress: Not on file  Social Connections: Not on file  Intimate Partner Violence: Not on file   Review of Systems Sleeps okay Bowels move fine    Objective:   Physical Exam Constitutional:      Appearance: Normal appearance.  Cardiovascular:     Rate and Rhythm: Normal rate and regular rhythm.     Pulses: Normal pulses.     Heart sounds: No murmur heard.    No gallop.  Pulmonary:     Effort: Pulmonary effort is normal.     Breath sounds: Normal breath sounds. No wheezing or rales.  Musculoskeletal:     Cervical back: Neck supple.     Right lower leg: No edema.     Left lower leg: No edema.  Lymphadenopathy:     Cervical: No cervical adenopathy.  Skin:    Comments: No foot lesions  Neurological:     Mental Status: She is alert.     Comments: Normal sensation in feet  Psychiatric:        Mood and Affect: Mood normal.        Behavior: Behavior normal.            Assessment & Plan:

## 2023-02-06 NOTE — Assessment & Plan Note (Signed)
No recurrence No Rx

## 2023-03-13 ENCOUNTER — Other Ambulatory Visit: Payer: Self-pay | Admitting: Hematology and Oncology

## 2023-03-13 LAB — HM DIABETES EYE EXAM

## 2023-03-16 ENCOUNTER — Encounter: Payer: Self-pay | Admitting: Hematology and Oncology

## 2023-03-16 ENCOUNTER — Inpatient Hospital Stay (HOSPITAL_BASED_OUTPATIENT_CLINIC_OR_DEPARTMENT_OTHER): Payer: Medicare Other | Admitting: Hematology and Oncology

## 2023-03-16 ENCOUNTER — Inpatient Hospital Stay: Payer: Medicare Other | Attending: Hematology and Oncology

## 2023-03-16 VITALS — BP 157/75 | HR 80 | Temp 97.4°F | Resp 18 | Ht 63.5 in | Wt 145.2 lb

## 2023-03-16 DIAGNOSIS — D630 Anemia in neoplastic disease: Secondary | ICD-10-CM | POA: Diagnosis not present

## 2023-03-16 DIAGNOSIS — Z79899 Other long term (current) drug therapy: Secondary | ICD-10-CM | POA: Insufficient documentation

## 2023-03-16 DIAGNOSIS — D638 Anemia in other chronic diseases classified elsewhere: Secondary | ICD-10-CM | POA: Insufficient documentation

## 2023-03-16 DIAGNOSIS — D473 Essential (hemorrhagic) thrombocythemia: Secondary | ICD-10-CM

## 2023-03-16 DIAGNOSIS — Z7982 Long term (current) use of aspirin: Secondary | ICD-10-CM | POA: Diagnosis not present

## 2023-03-16 DIAGNOSIS — E1169 Type 2 diabetes mellitus with other specified complication: Secondary | ICD-10-CM | POA: Diagnosis not present

## 2023-03-16 LAB — CBC WITH DIFFERENTIAL/PLATELET
Abs Immature Granulocytes: 0.01 10*3/uL (ref 0.00–0.07)
Basophils Absolute: 0 10*3/uL (ref 0.0–0.1)
Basophils Relative: 1 %
Eosinophils Absolute: 0.1 10*3/uL (ref 0.0–0.5)
Eosinophils Relative: 1 %
HCT: 32.6 % — ABNORMAL LOW (ref 36.0–46.0)
Hemoglobin: 11.5 g/dL — ABNORMAL LOW (ref 12.0–15.0)
Immature Granulocytes: 0 %
Lymphocytes Relative: 43 %
Lymphs Abs: 2.4 10*3/uL (ref 0.7–4.0)
MCH: 38.5 pg — ABNORMAL HIGH (ref 26.0–34.0)
MCHC: 35.3 g/dL (ref 30.0–36.0)
MCV: 109 fL — ABNORMAL HIGH (ref 80.0–100.0)
Monocytes Absolute: 0.5 10*3/uL (ref 0.1–1.0)
Monocytes Relative: 9 %
Neutro Abs: 2.6 10*3/uL (ref 1.7–7.7)
Neutrophils Relative %: 46 %
Platelets: 480 10*3/uL — ABNORMAL HIGH (ref 150–400)
RBC: 2.99 MIL/uL — ABNORMAL LOW (ref 3.87–5.11)
RDW: 11.9 % (ref 11.5–15.5)
WBC: 5.7 10*3/uL (ref 4.0–10.5)
nRBC: 0 % (ref 0.0–0.2)

## 2023-03-16 NOTE — Assessment & Plan Note (Signed)
Her CBC showed stable anemia With recent changes to the doses of Hydrea, her platelet count has improved For now, I recommend she continues on the same doses of treatment She will take 1000 mg 4 times a week and 500 mg on Mondays, Wednesdays and Fridays I will reassess in 6 months If she has appointment with her primary care doctor in January, she will inform me with results of his CBC and if the results are satisfactory, I might be able to space out the interval of her future appointment

## 2023-03-16 NOTE — Progress Notes (Signed)
[facility] OFFICE PROGRESS NOTE  Patient Care Team: Karie Schwalbe, MD as PCP - General Luvenia Redden, MD as Attending Physician (Obstetrics and Gynecology) Artis Delay, MD as Consulting Physician (Hematology and Oncology) Vilinda Flake, [facility] (Inactive) as Pharmacist (Pharmacist)  ASSESSMENT & PLAN:  Essential thrombocythemia Her CBC showed stable anemia With recent changes to the doses of Hydrea, her platelet count has improved For now, I recommend she continues on the same doses of treatment She will take 1000 mg 4 times a week and 500 mg on Mondays, Wednesdays and Fridays I will reassess in 6 months If she has appointment with her primary care doctor in January, she will inform me with results of his CBC and if the results are satisfactory, I might be able to space out the interval of her future appointment  Anemia in neoplastic disease She has anemia chronic illness secondary to diabetes and hydroxyurea   No orders of the defined types were placed in this encounter.   All questions were answered. The patient knows to call the clinic with any problems, questions or concerns. The total time spent in the appointment was 20 minutes encounter with patients including review of chart and various tests results, discussions about plan of care and coordination of care plan   Artis Delay, MD 03/16/2023 9:55 AM  INTERVAL HISTORY: Please see below for problem oriented charting. she returns for surveillance follow-up on hydroxyurea She denies missing doses No recent infection We discussed test results and future follow-up  REVIEW OF SYSTEMS:   Constitutional: Denies fevers, chills or abnormal weight loss Eyes: Denies blurriness of vision Ears, nose, mouth, throat, and face: Denies mucositis or sore throat Respiratory: Denies cough, dyspnea or wheezes Cardiovascular: Denies palpitation, chest discomfort or lower extremity swelling Gastrointestinal:  Denies nausea,  heartburn or change in bowel habits Skin: Denies abnormal skin rashes Lymphatics: Denies new lymphadenopathy or easy bruising Neurological:Denies numbness, tingling or new weaknesses Behavioral/Psych: Mood is stable, no new changes  All other systems were reviewed with the patient and are negative.  I have reviewed the past medical history, past surgical history, social history and family history with the patient and they are unchanged from previous note.  ALLERGIES:  has No Known Allergies.  MEDICATIONS:  Current Outpatient Medications  Medication Sig Dispense Refill   Ascorbic Acid (VITAMIN C) 500 MG tablet Take 500 mg by mouth daily.       aspirin 81 MG tablet Take 81 mg by mouth daily.       calcium-vitamin D (OSCAL) 250-125 MG-UNIT per tablet Take 1 tablet by mouth daily.       cetirizine (ZYRTEC) 10 MG tablet Take 10 mg by mouth daily as needed for allergies.      ferrous sulfate 325 (65 FE) MG tablet Take 325 mg by mouth daily with breakfast.     fish oil-omega-3 fatty acids 1000 MG capsule Take 1 g by mouth daily.      glipiZIDE (GLUCOTROL) 5 MG tablet TAKE 1 TABLET (5 MG TOTAL) BY MOUTH TWICE A DAY BEFORE MEALS 180 tablet 3   hydroxyurea (HYDREA) 500 MG capsule TAKE 1 CAPSULE BY MOUTH DAILY. 90 capsule 1   metFORMIN (GLUCOPHAGE) 1000 MG tablet TAKE 1 TABLET BY MOUTH TWICE A DAY W/ A MEAL 180 tablet 3   Multiple Vitamin (MULTIVITAMIN) tablet Take 1 tablet by mouth daily.       omeprazole (PRILOSEC OTC) 20 MG tablet Take 20 mg by mouth daily.  Semaglutide, 2 MG/DOSE, (OZEMPIC, 2 MG/DOSE,) 8 MG/3ML SOPN INJECT 2 MG AS DIRECTED ONCE A WEEK. 9 mL 3   simvastatin (ZOCOR) 10 MG tablet TAKE 1 TABLET BY MOUTH EVERY DAY 90 tablet 3   No current facility-administered medications for this visit.    SUMMARY OF ONCOLOGIC HISTORY: Oncology History  Essential thrombocythemia (HCC)  10/11/2012 Initial Diagnosis   She was evaluated for abnormal CBC   10/14/2012 Bone Marrow Biopsy   Bone  Marrow, Aspirate,Biopsy, and Clot, right iliac - VARIABLY CELLULAR BONE MARROW FOR AGE WITH ATYPICAL MEGAKARYOCYTIC PROLIFERATION - SEE COMMENT. PERIPHERAL BLOOD: - NORMOCYTIC-NORMOCHROMIC ANEMIA. - THROMBOCYTOSIS. Diagnosis Note The bone marrow material is limited with lack of aspirate or adequate touch imprints for cytomorphologic evaluation. Nonetheless, there is an atypical megakaryocytic proliferation in the core biopsy worrisome for a myeloproliferative neoplasm (chronic myeloproliferative disorder) particularly essential thrombocythemia. Correlation with cytogenetic studies is recommended. BCR/ABl is neg   10/21/2012 Initial Diagnosis   Essential thrombocythemia (HCC)   12/13/2012 -  Chemotherapy   On 01/08/2014, dose of hydroxyurea was modified to take Hydrea,500 mg 6 days a week and skip on Sundays. Since September 2015, she reduce hydroxyurea by skipping doses on Wednesday and Sundays On 05/04/2014, I reduce hydroxyurea dose further to 500 mg 4 days a week and to add anagrelide. Due to difficulties obtaining the pills, she had not been able to take hydroxyurea and anagrelide. On 06/04/14: dose of hydroxyurea is increased to 500 mg daily. In 2019, the dose of Hydrea was increased to 500 mg daily except on Saturdays and Sundays she take 1000 mg On April 30, 2020, the dose of hydroxyurea is increased to 1000 mg on Mondays, Wednesdays and Fridays and to take 500 mg for the rest of the week On December 14, 2022, She will take hydroxyurea 1000 mg 4 times a week and 500 mg on Mondays, Wednesdays and Fridays      PHYSICAL EXAMINATION: ECOG PERFORMANCE STATUS: 0 - Asymptomatic  Vitals:   03/16/23 0846  BP: (!) 157/75  Pulse: 80  Resp: 18  Temp: (!) 97.4 F (36.3 C)  SpO2: 100%   Filed Weights   03/16/23 0846  Weight: 145 lb 3.2 oz (65.9 kg)    GENERAL:alert, no distress and comfortable  LABORATORY DATA:  I have reviewed the data as listed    Component Value Date/Time    NA 136 01/17/2022 0954   NA 136 03/31/2013 1414   K 5.1 01/17/2022 0954   K 4.0 03/31/2013 1414   CL 99 01/17/2022 0954   CO2 29 01/17/2022 0954   CO2 24 03/31/2013 1414   GLUCOSE 188 (H) 01/17/2022 0954   GLUCOSE 178 (H) 03/31/2013 1414   BUN 9 01/17/2022 0954   BUN 8.1 03/31/2013 1414   CREATININE 0.66 01/17/2022 0954   CREATININE 0.7 03/31/2013 1414   CALCIUM 9.9 01/17/2022 0954   CALCIUM 10.1 03/31/2013 1414   PROT 7.2 01/17/2022 0954   PROT 7.2 03/31/2013 1414   ALBUMIN 4.2 01/17/2022 0954   ALBUMIN 3.9 03/31/2013 1414   AST 16 01/17/2022 0954   AST 19 03/31/2013 1414   ALT 16 01/17/2022 0954   ALT 15 03/31/2013 1414   ALKPHOS 46 01/17/2022 0954   ALKPHOS 55 03/31/2013 1414   BILITOT 0.4 01/17/2022 0954   BILITOT 0.28 03/31/2013 1414   GFRNONAA 112.68 10/29/2009 1221    No results found for: "SPEP", "UPEP"  Lab Results  Component Value Date   WBC 5.7 03/16/2023   NEUTROABS  2.6 03/16/2023   HGB 11.5 (L) 03/16/2023   HCT 32.6 (L) 03/16/2023   MCV 109.0 (H) 03/16/2023   PLT 480 (H) 03/16/2023      Chemistry      Component Value Date/Time   NA 136 01/17/2022 0954   NA 136 03/31/2013 1414   K 5.1 01/17/2022 0954   K 4.0 03/31/2013 1414   CL 99 01/17/2022 0954   CO2 29 01/17/2022 0954   CO2 24 03/31/2013 1414   BUN 9 01/17/2022 0954   BUN 8.1 03/31/2013 1414   CREATININE 0.66 01/17/2022 0954   CREATININE 0.7 03/31/2013 1414      Component Value Date/Time   CALCIUM 9.9 01/17/2022 0954   CALCIUM 10.1 03/31/2013 1414   ALKPHOS 46 01/17/2022 0954   ALKPHOS 55 03/31/2013 1414   AST 16 01/17/2022 0954   AST 19 03/31/2013 1414   ALT 16 01/17/2022 0954   ALT 15 03/31/2013 1414   BILITOT 0.4 01/17/2022 0954   BILITOT 0.28 03/31/2013 1414

## 2023-03-16 NOTE — Assessment & Plan Note (Signed)
She has anemia chronic illness secondary to diabetes and hydroxyurea

## 2023-06-08 ENCOUNTER — Ambulatory Visit: Payer: Medicare Other | Admitting: Internal Medicine

## 2023-06-08 ENCOUNTER — Encounter: Payer: Self-pay | Admitting: Internal Medicine

## 2023-06-08 VITALS — BP 138/74 | HR 74 | Temp 98.5°F | Ht 63.5 in | Wt 144.0 lb

## 2023-06-08 DIAGNOSIS — Z0000 Encounter for general adult medical examination without abnormal findings: Secondary | ICD-10-CM

## 2023-06-08 DIAGNOSIS — E1159 Type 2 diabetes mellitus with other circulatory complications: Secondary | ICD-10-CM | POA: Diagnosis not present

## 2023-06-08 DIAGNOSIS — Z1159 Encounter for screening for other viral diseases: Secondary | ICD-10-CM

## 2023-06-08 DIAGNOSIS — I471 Supraventricular tachycardia, unspecified: Secondary | ICD-10-CM

## 2023-06-08 DIAGNOSIS — E119 Type 2 diabetes mellitus without complications: Secondary | ICD-10-CM | POA: Diagnosis not present

## 2023-06-08 DIAGNOSIS — D473 Essential (hemorrhagic) thrombocythemia: Secondary | ICD-10-CM

## 2023-06-08 DIAGNOSIS — Z7984 Long term (current) use of oral hypoglycemic drugs: Secondary | ICD-10-CM | POA: Diagnosis not present

## 2023-06-08 DIAGNOSIS — I10 Essential (primary) hypertension: Secondary | ICD-10-CM | POA: Diagnosis not present

## 2023-06-08 DIAGNOSIS — K219 Gastro-esophageal reflux disease without esophagitis: Secondary | ICD-10-CM | POA: Diagnosis not present

## 2023-06-08 DIAGNOSIS — Z7985 Long-term (current) use of injectable non-insulin antidiabetic drugs: Secondary | ICD-10-CM

## 2023-06-08 DIAGNOSIS — Z1211 Encounter for screening for malignant neoplasm of colon: Secondary | ICD-10-CM

## 2023-06-08 LAB — CBC
HCT: 34.9 % — ABNORMAL LOW (ref 36.0–46.0)
Hemoglobin: 11.7 g/dL — ABNORMAL LOW (ref 12.0–15.0)
MCHC: 33.5 g/dL (ref 30.0–36.0)
MCV: 113.3 fL — ABNORMAL HIGH (ref 78.0–100.0)
Platelets: 600 10*3/uL — ABNORMAL HIGH (ref 150.0–400.0)
RBC: 3.08 Mil/uL — ABNORMAL LOW (ref 3.87–5.11)
RDW: 12.5 % (ref 11.5–15.5)
WBC: 4.8 10*3/uL (ref 4.0–10.5)

## 2023-06-08 LAB — RENAL FUNCTION PANEL
Albumin: 4.5 g/dL (ref 3.5–5.2)
BUN: 9 mg/dL (ref 6–23)
CO2: 29 meq/L (ref 19–32)
Calcium: 9.7 mg/dL (ref 8.4–10.5)
Chloride: 99 meq/L (ref 96–112)
Creatinine, Ser: 0.6 mg/dL (ref 0.40–1.20)
GFR: 88.88 mL/min (ref >60.00)
Glucose, Bld: 142 mg/dL — ABNORMAL HIGH (ref 70–99)
Phosphorus: 4.7 mg/dL — ABNORMAL HIGH (ref 2.3–4.6)
Potassium: 5.1 meq/L (ref 3.5–5.1)
Sodium: 136 meq/L (ref 135–145)

## 2023-06-08 LAB — LIPID PANEL
Cholesterol: 147 mg/dL (ref 0–200)
HDL: 65.1 mg/dL (ref >39.00)
LDL Cholesterol: 68 mg/dL (ref 0–99)
NonHDL: 81.99
Total CHOL/HDL Ratio: 2
Triglycerides: 71 mg/dL (ref 0.0–149.0)
VLDL: 14.2 mg/dL (ref 0.0–40.0)

## 2023-06-08 LAB — HEPATIC FUNCTION PANEL
ALT: 14 U/L (ref 0–35)
AST: 18 U/L (ref 0–37)
Albumin: 4.5 g/dL (ref 3.5–5.2)
Alkaline Phosphatase: 53 U/L (ref 39–117)
Bilirubin, Direct: 0.1 mg/dL (ref 0.0–0.3)
Total Bilirubin: 0.4 mg/dL (ref 0.2–1.2)
Total Protein: 7.1 g/dL (ref 6.0–8.3)

## 2023-06-08 LAB — MICROALBUMIN / CREATININE URINE RATIO
Creatinine,U: 91.6 mg/dL
Microalb Creat Ratio: 3.9 mg/g (ref 0.0–30.0)
Microalb, Ur: 3.5 mg/dL — ABNORMAL HIGH (ref 0.0–1.9)

## 2023-06-08 LAB — HM DIABETES FOOT EXAM

## 2023-06-08 LAB — HEMOGLOBIN A1C: Hgb A1c MFr Bld: 6.8 % — ABNORMAL HIGH (ref 4.6–6.5)

## 2023-06-08 NOTE — Assessment & Plan Note (Signed)
BP Readings from Last 3 Encounters:  06/08/23 138/74  03/16/23 (!) 157/75  02/06/23 136/74   Controlled without meds Consider ARB if proteinuria

## 2023-06-08 NOTE — Assessment & Plan Note (Signed)
I have personally reviewed the Medicare Annual Wellness questionnaire and have noted 1. The patient's medical and social history 2. Their use of alcohol, tobacco or illicit drugs 3. Their current medications and supplements 4. The patient's functional ability including ADL's, fall risks, home safety risks and hearing or visual             impairment. 5. Diet and physical activities 6. Evidence for depression or mood disorders  The patients weight, height, BMI and visual acuity have been recorded in the chart I have made referrals, counseling and provided education to the patient based review of the above and I have provided the pt with a written personalized care plan for preventive services.  I have provided you with a copy of your personalized plan for preventive services. Please take the time to review along with your updated medication list.'  Overdue for colon--will set up Will probably continue mammograms till 70 (every other year okay) Still not excited about flu or COVID vaccines--discussed Td at pharmacy Consider RSV also Exercising regularly

## 2023-06-08 NOTE — Assessment & Plan Note (Signed)
Seems to still have good control on ozempic 2mg  weekly, metformin 1000 bid and glipizide 5mg  bid

## 2023-06-08 NOTE — Assessment & Plan Note (Signed)
Doing well on the hydroxyurea

## 2023-06-08 NOTE — Progress Notes (Signed)
Hearing Screening - Comments:: Passed whisper test Vision Screening - Comments:: November 2024

## 2023-06-08 NOTE — Progress Notes (Signed)
Subjective:    Patient ID: Savannah Evans, female    DOB: [birth year], 73 y.o.   MRN: [id number]  HPI Here for Medicare wellness visit and follow up of chronic health conditions Reviewed advanced directives  Reviewed other doctors---Dr Gorsuch--hematology, Dr Gweneth Dimitri, Dr Mady Haagensen, Dr Sylvie Farrier Has been exercising regularly--[facility] No hospitalization or surgery in the past year Vision is okay ---keeps up with eye doctor Hearing is fine No alcohol or tobacco No falls No depression or anhedonia Independent with instrumental ADLs No sig memory issues  Continues on the hydroxyurea Follows with Dr Bertis Ruddy Mild anemia on this  Checks sugars occasionally Best ever for her---usually around 110 No foot numbness or burning  Mild GI symptoms with the ozempic Now has been used to it  Still some heartburn--mostly if she eats late Takes the omeprazole daily----discussed switching to bedtime (she had been taking it after breakfast) No dysphagia  No palpitations  No chest pain or SOB No dizziness or syncope No edema  Current Outpatient Medications on File Prior to Visit  Medication Sig Dispense Refill   Ascorbic Acid (VITAMIN C) 500 MG tablet Take 500 mg by mouth daily.       aspirin 81 MG tablet Take 81 mg by mouth daily.       calcium-vitamin D (OSCAL) 250-125 MG-UNIT per tablet Take 1 tablet by mouth daily.       cetirizine (ZYRTEC) 10 MG tablet Take 10 mg by mouth daily as needed for allergies.      ferrous sulfate 325 (65 FE) MG tablet Take 325 mg by mouth daily with breakfast.     fish oil-omega-3 fatty acids 1000 MG capsule Take 1 g by mouth daily.      glipiZIDE (GLUCOTROL) 5 MG tablet TAKE 1 TABLET (5 MG TOTAL) BY MOUTH TWICE A DAY BEFORE MEALS 180 tablet 3   hydroxyurea (HYDREA) 500 MG capsule TAKE 1 CAPSULE BY MOUTH DAILY. 90 capsule 1   metFORMIN (GLUCOPHAGE) 1000 MG tablet TAKE 1 TABLET BY MOUTH TWICE A DAY W/ A MEAL 180 tablet 3    Multiple Vitamin (MULTIVITAMIN) tablet Take 1 tablet by mouth daily.       omeprazole (PRILOSEC OTC) 20 MG tablet Take 20 mg by mouth daily.      Semaglutide, 2 MG/DOSE, (OZEMPIC, 2 MG/DOSE,) 8 MG/3ML SOPN INJECT 2 MG AS DIRECTED ONCE A WEEK. 9 mL 3   simvastatin (ZOCOR) 10 MG tablet TAKE 1 TABLET BY MOUTH EVERY DAY 90 tablet 3   No current facility-administered medications on file prior to visit.    No Known Allergies  Past Medical History:  Diagnosis Date   Allergic rhinitis    Anemia, unspecified 03/03/2013   Diabetes mellitus type 2, noninsulin dependent (HCC)    Diaphragmatic hernia    Essential thrombocythemia (HCC) 10/21/2012   JAK2 neg; BCR-ABL neg; bone marrow biopsy on 09/29/12 showed normal cytogenetics.    GERD (gastroesophageal reflux disease)    History of palpitations    Hyperlipidemia    Iron deficiency    Osteoarthritis    SVT (supraventricular tachycardia) (HCC)     Past Surgical History:  Procedure Laterality Date   COLONOSCOPY     neg age 66.  Next age 70.    svt ablation  2000   Dr. Graciela Husbands    Family History  Problem Relation Age of Onset   Heart attack Father    Uterine cancer Sister    Diabetes Sister  Macular degeneration Mother    Cancer Paternal Aunt        cancer?   Cancer Paternal Uncle        cancer?   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[employer]    Comment: Retired 2020  Tobacco Use   Smoking status: Never    Passive exposure: Past   Smokeless tobacco: Never  Substance and Sexual Activity   Alcohol use: Yes    Alcohol/week: 1.0 standard drink of alcohol    Types: 1 Glasses of wine per week    Comment: social   Drug use: No   Sexual activity:  Not on file  Other Topics Concern   Not on file  Social History Narrative   Has living will   Requests husband as health care POA-- alternate is daughter Judeth Cornfield   Would accept resuscitation attempts but no prolonged ventilation   No tube feeds if cognitively unaware   Social Drivers of Corporate investment banker Strain: Not on file  Food Insecurity: Not on file  Transportation Needs: Not on file  Physical Activity: Not on file  Stress: Not on file  Social Connections: Not on file  Intimate Partner Violence: Not on file   Review of Systems Appetite is controlled with ozempic Weight is stable Sleeps okay---unless she has reflux Wears seat belt Teeth okay---keeps up with dentist Bowels okay--no blood Voids fine. Rare stress incontinence No sig back or joint pains No suspicious skin lesions    Objective:   Physical Exam Constitutional:      Appearance: Normal appearance.  HENT:     Mouth/Throat:     Pharynx: No oropharyngeal exudate or posterior oropharyngeal erythema.  Eyes:     Conjunctiva/sclera: Conjunctivae normal.     Pupils: Pupils are equal, round, and reactive to light.  Cardiovascular:     Rate and Rhythm: Normal rate and regular rhythm.     Pulses: Normal pulses.     Heart sounds: No murmur heard.    No gallop.  Pulmonary:     Effort: Pulmonary effort is normal.     Breath sounds: Normal breath sounds. No wheezing or rales.  Abdominal:     Palpations: Abdomen is soft.     Tenderness: There is no abdominal tenderness.  Musculoskeletal:     Cervical back: Neck supple.     Right lower leg: No edema.     Left lower leg: No edema.  Lymphadenopathy:     Cervical: No cervical adenopathy.  Skin:    Findings: No lesion or rash.     Comments: No foot lesions  Neurological:     General: No focal deficit present.     Mental Status: She is alert and oriented to person, place, and time.     Comments: Normal sensation in feet Mini-cog----normal   Psychiatric:        Mood and Affect: Mood normal.        Behavior: Behavior normal.            Assessment & Plan:

## 2023-06-08 NOTE — Assessment & Plan Note (Signed)
Asked her to change the omeprazole to bedtime

## 2023-06-08 NOTE — Assessment & Plan Note (Signed)
No symptoms of recurrence

## 2023-06-09 ENCOUNTER — Encounter: Payer: Self-pay | Admitting: Internal Medicine

## 2023-06-09 LAB — HEPATITIS C ANTIBODY: Hepatitis C Ab: NONREACTIVE

## 2023-06-11 ENCOUNTER — Telehealth: Payer: Self-pay

## 2023-06-11 NOTE — Telephone Encounter (Signed)
Called and scheduled appt on 1/30 after PCP shared recent abnormal CBC per Dr. Bertis Ruddy. She is aware of appts.

## 2023-06-14 ENCOUNTER — Encounter: Payer: Self-pay | Admitting: Hematology and Oncology

## 2023-06-14 ENCOUNTER — Inpatient Hospital Stay (HOSPITAL_BASED_OUTPATIENT_CLINIC_OR_DEPARTMENT_OTHER): Payer: Medicare Other | Admitting: Hematology and Oncology

## 2023-06-14 ENCOUNTER — Inpatient Hospital Stay: Payer: Medicare Other | Attending: Hematology and Oncology

## 2023-06-14 VITALS — BP 135/69 | HR 84 | Temp 98.5°F | Resp 18 | Ht 63.5 in | Wt 145.2 lb

## 2023-06-14 DIAGNOSIS — D649 Anemia, unspecified: Secondary | ICD-10-CM | POA: Diagnosis not present

## 2023-06-14 DIAGNOSIS — D473 Essential (hemorrhagic) thrombocythemia: Secondary | ICD-10-CM | POA: Insufficient documentation

## 2023-06-14 LAB — CBC WITH DIFFERENTIAL/PLATELET
Abs Immature Granulocytes: 0.02 10*3/uL (ref 0.00–0.07)
Basophils Absolute: 0 10*3/uL (ref 0.0–0.1)
Basophils Relative: 1 %
Eosinophils Absolute: 0.2 10*3/uL (ref 0.0–0.5)
Eosinophils Relative: 3 %
HCT: 30.5 % — ABNORMAL LOW (ref 36.0–46.0)
Hemoglobin: 10.6 g/dL — ABNORMAL LOW (ref 12.0–15.0)
Immature Granulocytes: 0 %
Lymphocytes Relative: 33 %
Lymphs Abs: 2 10*3/uL (ref 0.7–4.0)
MCH: 37.6 pg — ABNORMAL HIGH (ref 26.0–34.0)
MCHC: 34.8 g/dL (ref 30.0–36.0)
MCV: 108.2 fL — ABNORMAL HIGH (ref 80.0–100.0)
Monocytes Absolute: 0.6 10*3/uL (ref 0.1–1.0)
Monocytes Relative: 9 %
Neutro Abs: 3.3 10*3/uL (ref 1.7–7.7)
Neutrophils Relative %: 54 %
Platelets: 487 10*3/uL — ABNORMAL HIGH (ref 150–400)
RBC: 2.82 MIL/uL — ABNORMAL LOW (ref 3.87–5.11)
RDW: 11.5 % (ref 11.5–15.5)
WBC: 6.1 10*3/uL (ref 4.0–10.5)
nRBC: 0 % (ref 0.0–0.2)

## 2023-06-14 MED ORDER — HYDROXYUREA 500 MG PO CAPS: ORAL_CAPSULE | ORAL | 1 refills | Status: DC

## 2023-06-14 NOTE — Assessment & Plan Note (Signed)
Her CBC showed stable anemia For now, I recommend she continues on the same doses of treatment She will take 1000 mg 4 times a week and 500 mg on Mondays, Wednesdays and Fridays I will reassess in May

## 2023-06-14 NOTE — Progress Notes (Signed)
[facility] OFFICE PROGRESS NOTE  Patient Care Team: Karie Schwalbe, MD as PCP - General Luvenia Redden, MD as Attending Physician (Obstetrics and Gynecology) Artis Delay, MD as Consulting Physician (Hematology and Oncology) Vilinda Flake, [facility] (Inactive) as Pharmacist (Pharmacist)  ASSESSMENT & PLAN:  Essential thrombocythemia Her CBC showed stable anemia For now, I recommend she continues on the same doses of treatment She will take 1000 mg 4 times a week and 500 mg on Mondays, Wednesdays and Fridays I will reassess in May  No orders of the defined types were placed in this encounter.   All questions were answered. The patient knows to call the clinic with any problems, questions or concerns. The total time spent in the appointment was 20 minutes encounter with patients including review of chart and various tests results, discussions about plan of care and coordination of care plan   Artis Delay, MD 06/14/2023 11:25 AM  INTERVAL HISTORY: Please see below for problem oriented charting. she returns for chemo follow-up on hydroxyurea I was notified of recent abnormal blood work The patient denies missing doses Her energy level fluctuated up and down Thankfully, repeat CBC today shows stable platelet count I suspect her recent elevated platelet count could be due to fasting blood work/dehydration  REVIEW OF SYSTEMS:   Constitutional: Denies fevers, chills or abnormal weight loss Eyes: Denies blurriness of vision Ears, nose, mouth, throat, and face: Denies mucositis or sore throat Respiratory: Denies cough, dyspnea or wheezes Cardiovascular: Denies palpitation, chest discomfort or lower extremity swelling Gastrointestinal:  Denies nausea, heartburn or change in bowel habits Skin: Denies abnormal skin rashes Lymphatics: Denies new lymphadenopathy or easy bruising Neurological:Denies numbness, tingling or new weaknesses Behavioral/Psych: Mood is stable, no new  changes  All other systems were reviewed with the patient and are negative.  I have reviewed the past medical history, past surgical history, social history and family history with the patient and they are unchanged from previous note.  ALLERGIES:  has no known allergies.  MEDICATIONS:  Current Outpatient Medications  Medication Sig Dispense Refill   Ascorbic Acid (VITAMIN C) 500 MG tablet Take 500 mg by mouth daily.       aspirin 81 MG tablet Take 81 mg by mouth daily.       calcium-vitamin D (OSCAL) 250-125 MG-UNIT per tablet Take 1 tablet by mouth daily.       cetirizine (ZYRTEC) 10 MG tablet Take 10 mg by mouth daily as needed for allergies.      ferrous sulfate 325 (65 FE) MG tablet Take 325 mg by mouth daily with breakfast.     fish oil-omega-3 fatty acids 1000 MG capsule Take 1 g by mouth daily.      glipiZIDE (GLUCOTROL) 5 MG tablet TAKE 1 TABLET (5 MG TOTAL) BY MOUTH TWICE A DAY BEFORE MEALS 180 tablet 3   hydroxyurea (HYDREA) 500 MG capsule She will take 1000 mg 4 times a week and 500 mg on Mondays, Wednesdays and Fridays 90 capsule 1   metFORMIN (GLUCOPHAGE) 1000 MG tablet TAKE 1 TABLET BY MOUTH TWICE A DAY W/ A MEAL 180 tablet 3   Multiple Vitamin (MULTIVITAMIN) tablet Take 1 tablet by mouth daily.       omeprazole (PRILOSEC OTC) 20 MG tablet Take 20 mg by mouth daily.      Semaglutide, 2 MG/DOSE, (OZEMPIC, 2 MG/DOSE,) 8 MG/3ML SOPN INJECT 2 MG AS DIRECTED ONCE A WEEK. 9 mL 3   simvastatin (ZOCOR) 10  MG tablet TAKE 1 TABLET BY MOUTH EVERY DAY 90 tablet 3   No current facility-administered medications for this visit.    SUMMARY OF ONCOLOGIC HISTORY: Oncology History  Essential thrombocythemia (HCC)  10/11/2012 Initial Diagnosis   She was evaluated for abnormal CBC   10/14/2012 Bone Marrow Biopsy   Bone Marrow, Aspirate,Biopsy, and Clot, right iliac - VARIABLY CELLULAR BONE MARROW FOR AGE WITH ATYPICAL MEGAKARYOCYTIC PROLIFERATION - SEE COMMENT. PERIPHERAL BLOOD: -  NORMOCYTIC-NORMOCHROMIC ANEMIA. - THROMBOCYTOSIS. Diagnosis Note The bone marrow material is limited with lack of aspirate or adequate touch imprints for cytomorphologic evaluation. Nonetheless, there is an atypical megakaryocytic proliferation in the core biopsy worrisome for a myeloproliferative neoplasm (chronic myeloproliferative disorder) particularly essential thrombocythemia. Correlation with cytogenetic studies is recommended. BCR/ABl is neg   10/21/2012 Initial Diagnosis   Essential thrombocythemia (HCC)   12/13/2012 -  Chemotherapy   On 01/08/2014, dose of hydroxyurea was modified to take Hydrea,500 mg 6 days a week and skip on Sundays. Since September 2015, she reduce hydroxyurea by skipping doses on Wednesday and Sundays On 05/04/2014, I reduce hydroxyurea dose further to 500 mg 4 days a week and to add anagrelide. Due to difficulties obtaining the pills, she had not been able to take hydroxyurea and anagrelide. On 06/04/14: dose of hydroxyurea is increased to 500 mg daily. In 2019, the dose of Hydrea was increased to 500 mg daily except on Saturdays and Sundays she take 1000 mg On April 30, 2020, the dose of hydroxyurea is increased to 1000 mg on Mondays, Wednesdays and Fridays and to take 500 mg for the rest of the week On December 14, 2022, She will take hydroxyurea 1000 mg 4 times a week and 500 mg on Mondays, Wednesdays and Fridays      PHYSICAL EXAMINATION: ECOG PERFORMANCE STATUS: 0 - Asymptomatic  Vitals:   06/14/23 1109  BP: 135/69  Pulse: 84  Resp: 18  Temp: 98.5 F (36.9 C)  SpO2: 98%   Filed Weights   06/14/23 1109  Weight: 145 lb 3.2 oz (65.9 kg)    GENERAL:alert, no distress and comfortable  LABORATORY DATA:  I have reviewed the data as listed    Component Value Date/Time   NA 136 06/08/2023 0842   NA 136 03/31/2013 1414   K 5.1 06/08/2023 0842   K 4.0 03/31/2013 1414   CL 99 06/08/2023 0842   CO2 29 06/08/2023 0842   CO2 24 03/31/2013 1414    GLUCOSE 142 (H) 06/08/2023 0842   GLUCOSE 178 (H) 03/31/2013 1414   BUN 9 06/08/2023 0842   BUN 8.1 03/31/2013 1414   CREATININE 0.60 06/08/2023 0842   CREATININE 0.7 03/31/2013 1414   CALCIUM 9.7 06/08/2023 0842   CALCIUM 10.1 03/31/2013 1414   PROT 7.1 06/08/2023 0842   PROT 7.2 03/31/2013 1414   ALBUMIN 4.5 06/08/2023 0842   ALBUMIN 4.5 06/08/2023 0842   ALBUMIN 3.9 03/31/2013 1414   AST 18 06/08/2023 0842   AST 19 03/31/2013 1414   ALT 14 06/08/2023 0842   ALT 15 03/31/2013 1414   ALKPHOS 53 06/08/2023 0842   ALKPHOS 55 03/31/2013 1414   BILITOT 0.4 06/08/2023 0842   BILITOT 0.28 03/31/2013 1414   GFRNONAA 112.68 10/29/2009 1221    No results found for: "SPEP", "UPEP"  Lab Results  Component Value Date   WBC 6.1 06/14/2023   NEUTROABS 3.3 06/14/2023   HGB 10.6 (L) 06/14/2023   HCT 30.5 (L) 06/14/2023   MCV 108.2 (H) 06/14/2023  PLT 487 (H) 06/14/2023      Chemistry      Component Value Date/Time   NA 136 06/08/2023 0842   NA 136 03/31/2013 1414   K 5.1 06/08/2023 0842   K 4.0 03/31/2013 1414   CL 99 06/08/2023 0842   CO2 29 06/08/2023 0842   CO2 24 03/31/2013 1414   BUN 9 06/08/2023 0842   BUN 8.1 03/31/2013 1414   CREATININE 0.60 06/08/2023 0842   CREATININE 0.7 03/31/2013 1414      Component Value Date/Time   CALCIUM 9.7 06/08/2023 0842   CALCIUM 10.1 03/31/2013 1414   ALKPHOS 53 06/08/2023 0842   ALKPHOS 55 03/31/2013 1414   AST 18 06/08/2023 0842   AST 19 03/31/2013 1414   ALT 14 06/08/2023 0842   ALT 15 03/31/2013 1414   BILITOT 0.4 06/08/2023 0842   BILITOT 0.28 03/31/2013 1414

## 2023-06-19 ENCOUNTER — Telehealth: Payer: Self-pay | Admitting: Pharmacist

## 2023-06-19 ENCOUNTER — Other Ambulatory Visit (HOSPITAL_COMMUNITY): Payer: Self-pay

## 2023-06-19 NOTE — Telephone Encounter (Signed)
Pharmacy Patient Advocate Encounter   Received notification from CoverMyMeds that prior authorization for Ozempic (2 MG/DOSE) 8MG /3ML pen-injectors is required/requested.   Insurance verification completed.   The patient is insured through [facility] .   Per test claim: PA required; PA submitted to above mentioned insurance via CoverMyMeds Key/confirmation #/EOC  [facility]) Status is pending

## 2023-06-19 NOTE — Telephone Encounter (Signed)
Pharmacy Patient Advocate Encounter  Received notification from [facility] that Prior Authorization for Ozempic (2 MG/DOSE) 8MG /3ML pen-injectors has been APPROVED from 06/19/2023 to 05/14/2024   PA #/Case ID/Reference #: [id number]

## 2023-07-03 ENCOUNTER — Telehealth: Payer: Self-pay

## 2023-07-03 NOTE — Telephone Encounter (Signed)
 VM left for patient to call regarding PV apt. VM for pt to call back.

## 2023-07-04 ENCOUNTER — Ambulatory Visit (AMBULATORY_SURGERY_CENTER): Payer: Medicare Other

## 2023-07-04 VITALS — Ht 63.5 in | Wt 140.0 lb

## 2023-07-04 DIAGNOSIS — Z1211 Encounter for screening for malignant neoplasm of colon: Secondary | ICD-10-CM

## 2023-07-04 MED ORDER — SUFLAVE 178.7 G PO SOLR: 1.0000 | ORAL | 0 refills | Status: DC

## 2023-07-04 NOTE — Progress Notes (Signed)
 No egg or soy allergy known to patient  No issues known to pt with past sedation with any surgeries or procedures Patient denies ever being told they had issues or difficulty with intubation  No FH of Malignant Hyperthermia Pt is not on diet pills Pt is not on  home 02  Pt is not on blood thinners  Pt denies issues with constipation  No A fib or A flutter Have any cardiac testing pending-- no  LOA: independent  Prep: suflave    PV completed with patient. Prep instructions sent via mychart and home address.

## 2023-07-17 NOTE — Progress Notes (Signed)
 [facility] History and Physical   Primary Care Physician:  Karie Schwalbe, MD   Reason for Procedure:  Colon cancer screening  Plan:    Screening colonoscopy  HPI: Savannah Evans is a 73 y.o. female undergoing screening colonoscopy for colon cancer screening.  Last colonoscopy was performed in 2014 and was normal.  No family history of colorectal cancer or polyps.  Patient denies current symptoms of change in bowel habits or rectal bleeding.   Past Medical History:  Diagnosis Date   Allergic rhinitis    Anemia, unspecified 03/03/2013   Diabetes mellitus type 2, noninsulin dependent (HCC)    Diaphragmatic hernia    Essential thrombocythemia (HCC) 10/21/2012   JAK2 neg; BCR-ABL neg; bone marrow biopsy on 09/29/12 showed normal cytogenetics.    GERD (gastroesophageal reflux disease)    History of palpitations    Hyperlipidemia    Iron deficiency    Osteoarthritis    SVT (supraventricular tachycardia) (HCC)     Past Surgical History:  Procedure Laterality Date   COLONOSCOPY     neg age 40.  Next age 57.    svt ablation  2000   Dr. Graciela Husbands    Prior to Admission medications   Medication Sig Start Date End Date Taking? Authorizing Provider  Ascorbic Acid (VITAMIN C) 500 MG tablet Take 500 mg by mouth daily.      [provider]  aspirin 81 MG tablet Take 81 mg by mouth daily.      [provider]  calcium-vitamin D (OSCAL) 250-125 MG-UNIT per tablet Take 1 tablet by mouth daily.      [provider]  cetirizine (ZYRTEC) 10 MG tablet Take 10 mg by mouth daily as needed for allergies.     [provider]  ferrous sulfate 325 (65 FE) MG tablet Take 325 mg by mouth daily with breakfast.    [provider]  fish oil-omega-3 fatty acids 1000 MG capsule Take 1 g by mouth daily.     [provider]  glipiZIDE (GLUCOTROL) 5 MG tablet TAKE 1 TABLET (5 MG TOTAL) BY MOUTH TWICE A DAY BEFORE MEALS 12/11/22   Karie Schwalbe, MD  hydroxyurea (HYDREA) 500 MG capsule She will take 1000 mg 4 times a week and 500 mg on Mondays, Wednesdays and Fridays 06/14/23   Artis Delay, MD  metFORMIN (GLUCOPHAGE) 1000 MG tablet TAKE 1 TABLET BY MOUTH TWICE A DAY W/ A MEAL 10/10/22   Karie Schwalbe, MD  Multiple Vitamin (MULTIVITAMIN) tablet Take 1 tablet by mouth daily.      [provider]  omeprazole (PRILOSEC OTC) 20 MG tablet Take 20 mg by mouth daily.     [provider]  PEG 3350-KCl-NaCl-NaSulf-MgSul (SUFLAVE) 178.7 g SOLR Take 1 kit by mouth as directed. 07/04/23   Ottie Glazier, MD  Semaglutide, 2 MG/DOSE, (OZEMPIC, 2 MG/DOSE,) 8 MG/3ML SOPN INJECT 2 MG AS DIRECTED ONCE A WEEK. 01/04/23   Tillman Abide I, MD  simvastatin (ZOCOR) 10 MG tablet TAKE 1 TABLET BY MOUTH EVERY DAY 01/19/23   Karie Schwalbe, MD    Current Outpatient Medications  Medication Sig Dispense Refill   Ascorbic Acid (VITAMIN C) 500 MG tablet Take 500 mg by mouth daily.       aspirin 81 MG tablet Take 81 mg by mouth daily.       calcium-vitamin D (OSCAL) 250-125 MG-UNIT per tablet Take 1 tablet by mouth daily.  cetirizine (ZYRTEC) 10 MG tablet Take 10 mg by mouth daily as needed for allergies.      ferrous sulfate 325 (65 FE) MG tablet Take 325 mg by mouth daily with breakfast.     fish oil-omega-3 fatty acids 1000 MG capsule Take 1 g by mouth daily.      glipiZIDE (GLUCOTROL) 5 MG tablet TAKE 1 TABLET (5 MG TOTAL) BY MOUTH TWICE A DAY BEFORE MEALS 180 tablet 3   hydroxyurea (HYDREA) 500 MG capsule She will take 1000 mg 4 times a week and 500 mg on Mondays, Wednesdays and Fridays 90 capsule 1   metFORMIN (GLUCOPHAGE) 1000 MG tablet TAKE 1 TABLET BY MOUTH TWICE A DAY W/ A MEAL 180 tablet 3   Multiple Vitamin (MULTIVITAMIN) tablet Take 1 tablet by mouth daily.       omeprazole (PRILOSEC OTC) 20 MG tablet Take 20 mg by mouth daily.      simvastatin (ZOCOR) 10 MG tablet TAKE 1 TABLET BY MOUTH EVERY DAY 90 tablet 3   Semaglutide,  2 MG/DOSE, (OZEMPIC, 2 MG/DOSE,) 8 MG/3ML SOPN INJECT 2 MG AS DIRECTED ONCE A WEEK. 9 mL 3   Current Facility-Administered Medications  Medication Dose Route Frequency Provider Last Rate Last Admin   0.9 %  sodium chloride infusion  500 mL Intravenous Once Avanni Turnbaugh, Durene Romans, MD        Allergies as of 07/20/2023   (No Known Allergies)    Family History  Problem Relation Age of Onset   Macular degeneration Mother    Heart attack Father    Uterine cancer Sister    Diabetes Sister    Cancer Paternal Aunt        cancer?   Cancer Paternal Uncle        cancer?   Colon cancer Neg Hx    Breast cancer Neg Hx    Rectal cancer Neg Hx    Stomach cancer Neg Hx     Social History   Socioeconomic History   Marital status: Married    Spouse name: Not on file   Number of children: 3   Years of education: Not on file   Highest education level: Not on file  Occupational History   Occupation: Manages loan administration area for [employer]    Comment: Retired 2020  Tobacco Use   Smoking status: Never    Passive exposure: Past   Smokeless tobacco: Never  Substance and Sexual Activity   Alcohol use: Yes    Alcohol/week: 1.0 standard drink of alcohol    Types: 1 Glasses of wine per week    Comment: social   Drug use: No   Sexual activity: Not on file  Other Topics Concern   Not on file  Social History Narrative   Has living will   Requests husband as health care POA-- alternate is daughter Judeth Cornfield   Would accept resuscitation attempts but no prolonged ventilation   No tube feeds if cognitively unaware   Social Drivers of Corporate investment banker Strain: Not on file  Food Insecurity: Not on file  Transportation Needs: Not on file  Physical Activity: Not on file  Stress: Not on file  Social Connections: Not on file  Intimate Partner Violence: Not on file    Review of Systems:  All other review of systems negative except as mentioned in the HPI.  Physical  Exam: Vital signs BP (!) 148/80   Pulse 91   Temp (!) 97.2 F (  36.2 C)   Ht 5' 3.5" (1.613 m)   Wt 140 lb (63.5 kg)   SpO2 99%   BMI 24.41 kg/m   General:   Alert,  Well-developed, well-nourished, pleasant and cooperative in NAD Airway:  Mallampati 1 Lungs:  Clear throughout to auscultation.   Heart:  Regular rate and rhythm; no murmurs, clicks, rubs,  or gallops. Abdomen:  Soft, nontender and nondistended. Normal bowel sounds.   Neuro/Psych:  Normal mood and affect. A and O x 3  Maren Beach, MD [facility]

## 2023-07-20 ENCOUNTER — Encounter: Payer: Self-pay | Admitting: Pediatrics

## 2023-07-20 ENCOUNTER — Ambulatory Visit: Payer: Medicare Other | Admitting: Pediatrics

## 2023-07-20 VITALS — BP 145/73 | HR 91 | Temp 97.2°F | Resp 15 | Ht 63.5 in | Wt 140.0 lb

## 2023-07-20 DIAGNOSIS — E119 Type 2 diabetes mellitus without complications: Secondary | ICD-10-CM | POA: Diagnosis not present

## 2023-07-20 DIAGNOSIS — Z1211 Encounter for screening for malignant neoplasm of colon: Secondary | ICD-10-CM

## 2023-07-20 DIAGNOSIS — I471 Supraventricular tachycardia, unspecified: Secondary | ICD-10-CM | POA: Diagnosis not present

## 2023-07-20 DIAGNOSIS — D120 Benign neoplasm of cecum: Secondary | ICD-10-CM

## 2023-07-20 DIAGNOSIS — D123 Benign neoplasm of transverse colon: Secondary | ICD-10-CM | POA: Diagnosis not present

## 2023-07-20 DIAGNOSIS — K648 Other hemorrhoids: Secondary | ICD-10-CM | POA: Diagnosis not present

## 2023-07-20 MED ORDER — SODIUM CHLORIDE 0.9 % IV SOLN: 500.0000 mL | Freq: Once | INTRAVENOUS | Status: DC

## 2023-07-20 NOTE — Progress Notes (Signed)
 Called to room to assist during endoscopic procedure.  Patient ID and intended procedure confirmed with present staff. Received instructions for my participation in the procedure from the performing physician.

## 2023-07-20 NOTE — Op Note (Signed)
 [facility] Patient Name: Savannah Evans Procedure Date: 07/20/2023 11:36 AM MRN: [id number] Endoscopist: Maren Beach , MD, [id number] Age: 73 Referring MD:  Date of Birth: 1950-11-04 Gender: Female Account #: [id number] Procedure:                Colonoscopy Indications:              Screening for colorectal malignant neoplasm, Last                            colonoscopy: 2014 Medicines:                Monitored Anesthesia Care Procedure:                Pre-Anesthesia Assessment:                           - Prior to the procedure, a History and Physical                            was performed, and patient medications and                            allergies were reviewed. The patient's tolerance of                            previous anesthesia was also reviewed. The risks                            and benefits of the procedure and the sedation                            options and risks were discussed with the patient.                            All questions were answered, and informed consent                            was obtained. Prior Anticoagulants: The patient has                            taken no anticoagulant or antiplatelet agents. ASA                            Grade Assessment: II - A patient with mild systemic                            disease. After reviewing the risks and benefits,                            the patient was deemed in satisfactory condition to                            undergo the procedure.  After obtaining informed consent, the colonoscope                            was passed under direct vision. Throughout the                            procedure, the patient's blood pressure, pulse, and                            oxygen saturations were monitored continuously. The                            CF [id number] #[id number] was introduced through the anus                            and advanced to the cecum, identified  by                            appendiceal orifice and ileocecal valve. The                            colonoscopy was performed without difficulty. The                            patient tolerated the procedure well. The quality                            of the bowel preparation was adequate. The                            ileocecal valve, appendiceal orifice, and rectum                            were photographed. Scope In: 11:41:26 AM Scope Out: 12:00:01 PM Scope Withdrawal Time: 0 hours 14 minutes 32 seconds  Total Procedure Duration: 0 hours 18 minutes 35 seconds  Findings:                 The perianal and digital rectal examinations were                            normal. Pertinent negatives include normal                            sphincter tone and no palpable rectal lesions.                           A moderate amount of semi-liquid stool was found in                            the transverse colon, in the ascending colon and in                            the cecum. Lavage of the area was performed using a  large amount of sterile water, resulting in                            clearance with adequate visualization.                           A 6 mm polyp was found in the cecum. The polyp was                            flat. The polyp was removed with a cold snare.                            Resection and retrieval were complete.                           Two sessile polyps were found in the transverse                            colon. The polyps were 2 to 3 mm in size. These                            polyps were removed with a cold biopsy forceps.                            Resection and retrieval were complete.                           Internal hemorrhoids were found during retroflexion. Complications:            No immediate complications. Estimated blood loss:                            Minimal. Estimated Blood Loss:     Estimated blood loss was  minimal. Impression:               - Stool in the transverse colon, in the ascending                            colon and in the cecum.                           - One 6 mm polyp in the cecum, removed with a cold                            snare. Resected and retrieved.                           - Two 2 to 3 mm polyps in the transverse colon,                            removed with a cold biopsy forceps. Resected and                            retrieved.                           -  Internal hemorrhoids. Recommendation:           - Discharge patient to home (ambulatory).                           - Await pathology results.                           - Repeat colonoscopy for surveillance based on                            pathology results.                           - The findings and recommendations were discussed                            with the patient's family.                           - Return to referring physician.                           - Patient has a contact number available for                            emergencies. The signs and symptoms of potential                            delayed complications were discussed with the                            patient. Return to normal activities tomorrow.                            Written discharge instructions were provided to the                            patient. Maren Beach, MD 07/20/2023 12:05:49 PM This report has been signed electronically.

## 2023-07-20 NOTE — Patient Instructions (Signed)
Handout given on polyps.  YOU HAD AN ENDOSCOPIC PROCEDURE TODAY AT THE [facility]:   Refer to the procedure report that was given to you for any specific questions about what was found during the examination.  If the procedure report does not answer your questions, please call your gastroenterologist to clarify.  If you requested that your care partner not be given the details of your procedure findings, then the procedure report has been included in a sealed envelope for you to review at your convenience later.  YOU SHOULD EXPECT: Some feelings of bloating in the abdomen. Passage of more gas than usual.  Walking can help get rid of the air that was put into your GI tract during the procedure and reduce the bloating. If you had a lower endoscopy (such as a colonoscopy or flexible sigmoidoscopy) you may notice spotting of blood in your stool or on the toilet paper. If you underwent a bowel prep for your procedure, you may not have a normal bowel movement for a few days.  Please Note:  You might notice some irritation and congestion in your nose or some drainage.  This is from the oxygen used during your procedure.  There is no need for concern and it should clear up in a day or so.  SYMPTOMS TO REPORT IMMEDIATELY:  Following lower endoscopy (colonoscopy or flexible sigmoidoscopy):  Excessive amounts of blood in the stool  Significant tenderness or worsening of abdominal pains  Swelling of the abdomen that is new, acute  Fever of 100F or higher   For urgent or emergent issues, a gastroenterologist can be reached at any hour by calling [phone]. Do not use MyChart messaging for urgent concerns.    DIET:  We do recommend a small meal at first, but then you may proceed to your regular diet.  Drink plenty of fluids but you should avoid alcoholic beverages for 24 hours.  ACTIVITY:  You should plan to take it easy for the rest of today and you should NOT DRIVE or use heavy  machinery until tomorrow (because of the sedation medicines used during the test).    FOLLOW UP: Our staff will call the number listed on your records the next business day following your procedure.  We will call around 7:15- 8:00 am to check on you and address any questions or concerns that you may have regarding the information given to you following your procedure. If we do not reach you, we will leave a message.     If any biopsies were taken you will be contacted by phone or by letter within the next 1-3 weeks.  Please call us at [phone] if you have not heard about the biopsies in 3 weeks.    SIGNATURES/CONFIDENTIALITY: You and/or your care partner have signed paperwork which will be entered into your electronic medical record.  These signatures attest to the fact that that the information above on your After Visit Summary has been reviewed and is understood.  Full responsibility of the confidentiality of this discharge information lies with you and/or your care-partner. 

## 2023-07-20 NOTE — Progress Notes (Signed)
 Sedate, gd SR, tolerated procedure well, VSS, report to RN

## 2023-07-23 ENCOUNTER — Telehealth: Payer: Self-pay

## 2023-07-23 NOTE — Telephone Encounter (Signed)
  Follow up Call-     07/20/2023   11:20 AM  Call back number  Post procedure Call Back phone  # [phone]  Permission to leave phone message Yes     Patient questions:  Do you have a fever, pain , or abdominal swelling? No. Pain Score  0 *  Have you tolerated food without any problems? Yes.    Have you been able to return to your normal activities? Yes.    Do you have any questions about your discharge instructions: Diet   No. Medications  No. Follow up visit  No.  Do you have questions or concerns about your Care? No.  Actions: * If pain score is 4 or above: No action needed, pain <4.

## 2023-07-24 ENCOUNTER — Encounter: Payer: Self-pay | Admitting: Pediatrics

## 2023-07-24 LAB — SURGICAL PATHOLOGY

## 2023-07-30 ENCOUNTER — Telehealth: Payer: Self-pay | Admitting: Internal Medicine

## 2023-07-30 ENCOUNTER — Other Ambulatory Visit: Payer: Self-pay | Admitting: Hematology and Oncology

## 2023-07-30 DIAGNOSIS — D473 Essential (hemorrhagic) thrombocythemia: Secondary | ICD-10-CM

## 2023-07-30 DIAGNOSIS — E1159 Type 2 diabetes mellitus with other circulatory complications: Secondary | ICD-10-CM

## 2023-07-30 MED ORDER — VALSARTAN 80 MG PO TABS: 80.0000 mg | ORAL_TABLET | Freq: Every day | ORAL | 3 refills | Status: AC

## 2023-07-30 NOTE — Telephone Encounter (Signed)
 No problem Her appt with me is on 5/2

## 2023-07-30 NOTE — Telephone Encounter (Signed)
 Called patient to discuss the urine microalbumin lab error that we had. The recalculation shows mild diabetic nephropathy As I had planned, will start ARB (valsartan 80mg  daily) Can check renal profile when gets labs with Dr Bertis Ruddy in May  Discussed SGLT-2 inhibitors---given the good A1c, we will hold off on this

## 2023-09-14 ENCOUNTER — Inpatient Hospital Stay: Payer: BLUE CROSS/BLUE SHIELD | Attending: Hematology and Oncology | Admitting: Hematology and Oncology

## 2023-09-14 ENCOUNTER — Inpatient Hospital Stay: Payer: BLUE CROSS/BLUE SHIELD

## 2023-09-14 ENCOUNTER — Encounter: Payer: Self-pay | Admitting: Hematology and Oncology

## 2023-09-14 VITALS — BP 140/62 | HR 83 | Temp 97.4°F | Resp 18 | Ht 63.5 in | Wt 144.2 lb

## 2023-09-14 DIAGNOSIS — D630 Anemia in neoplastic disease: Secondary | ICD-10-CM | POA: Insufficient documentation

## 2023-09-14 DIAGNOSIS — D473 Essential (hemorrhagic) thrombocythemia: Secondary | ICD-10-CM | POA: Diagnosis not present

## 2023-09-14 DIAGNOSIS — E1159 Type 2 diabetes mellitus with other circulatory complications: Secondary | ICD-10-CM

## 2023-09-14 LAB — CBC WITH DIFFERENTIAL/PLATELET
Abs Immature Granulocytes: 0.02 10*3/uL (ref 0.00–0.07)
Basophils Absolute: 0 10*3/uL (ref 0.0–0.1)
Basophils Relative: 1 %
Eosinophils Absolute: 0.1 10*3/uL (ref 0.0–0.5)
Eosinophils Relative: 1 %
HCT: 28.3 % — ABNORMAL LOW (ref 36.0–46.0)
Hemoglobin: 10 g/dL — ABNORMAL LOW (ref 12.0–15.0)
Immature Granulocytes: 0 %
Lymphocytes Relative: 45 %
Lymphs Abs: 2.3 10*3/uL (ref 0.7–4.0)
MCH: 37.9 pg — ABNORMAL HIGH (ref 26.0–34.0)
MCHC: 35.3 g/dL (ref 30.0–36.0)
MCV: 107.2 fL — ABNORMAL HIGH (ref 80.0–100.0)
Monocytes Absolute: 0.5 10*3/uL (ref 0.1–1.0)
Monocytes Relative: 9 %
Neutro Abs: 2.3 10*3/uL (ref 1.7–7.7)
Neutrophils Relative %: 44 %
Platelets: 423 10*3/uL — ABNORMAL HIGH (ref 150–400)
RBC: 2.64 MIL/uL — ABNORMAL LOW (ref 3.87–5.11)
RDW: 12.1 % (ref 11.5–15.5)
WBC: 5.1 10*3/uL (ref 4.0–10.5)
nRBC: 0 % (ref 0.0–0.2)

## 2023-09-14 LAB — BASIC METABOLIC PANEL - CANCER CENTER ONLY
Anion gap: 5 (ref 5–15)
BUN: 12 mg/dL (ref 8–23)
CO2: 27 mmol/L (ref 22–32)
Calcium: 9.2 mg/dL (ref 8.9–10.3)
Chloride: 101 mmol/L (ref 98–111)
Creatinine: 0.67 mg/dL (ref 0.44–1.00)
GFR, Estimated: >60 mL/min (ref >60)
Glucose, Bld: 108 mg/dL — ABNORMAL HIGH (ref 70–99)
Potassium: 5.3 mmol/L — ABNORMAL HIGH (ref 3.5–5.1)
Sodium: 133 mmol/L — ABNORMAL LOW (ref 135–145)

## 2023-09-14 MED ORDER — HYDROXYUREA 500 MG PO CAPS: ORAL_CAPSULE | ORAL | 1 refills | Status: DC

## 2023-09-14 NOTE — Assessment & Plan Note (Addendum)
 Her CBC showed stable anemia Her platelet count is improved compared to her previous visit For now, I recommend she continues on the same doses of treatment She will take 1000 mg 4 times a week and 500 mg on Mondays, Wednesdays and Fridays I refilled her prescription today I will reassess in 4 months

## 2023-09-14 NOTE — Assessment & Plan Note (Addendum)
 She has anemia chronic illness secondary to diabetes and hydroxyurea  Her hemoglobin is stable Observe only

## 2023-09-14 NOTE — Progress Notes (Signed)
 [facility] OFFICE PROGRESS NOTE  Patient Care Team: Helaine Llanos, MD as PCP - General Anne Kidd, MD as Attending Physician (Obstetrics and Gynecology) Almeda Jacobs, MD as Consulting Physician (Hematology and Oncology) Alta Ast, [facility] (Inactive) as Pharmacist (Pharmacist)  Assessment & Plan Essential thrombocythemia [facility]) Her CBC showed stable anemia Her platelet count is improved compared to her previous visit For now, I recommend she continues on the same doses of treatment She will take 1000 mg 4 times a week and 500 mg on Mondays, Wednesdays and Fridays I refilled her prescription today I will reassess in 4 months Anemia in neoplastic disease She has anemia chronic illness secondary to diabetes and hydroxyurea  Her hemoglobin is stable Observe only  No orders of the defined types were placed in this encounter.    Almeda Jacobs, MD  INTERVAL HISTORY: she returns for surveillance follow-up for myeloproliferative disorder/neoplasm Patient denies recent bleeding such as epistaxis, hematuria or hematochezia No recent infection We reviewed medication list and discussed medication changes We discussed test results and future plan of care as outlined above  PHYSICAL EXAMINATION: ECOG PERFORMANCE STATUS: 0 - Asymptomatic  Vitals:   09/14/23 0821  BP: (!) 140/62  Pulse: 83  Resp: 18  Temp: (!) 97.4 F (36.3 C)  SpO2: 100%   Lab Results  Component Value Date   WBC 5.1 09/14/2023   HGB 10.0 (L) 09/14/2023   HCT 28.3 (L) 09/14/2023   MCV 107.2 (H) 09/14/2023   PLT 423 (H) 09/14/2023

## 2023-11-01 ENCOUNTER — Other Ambulatory Visit: Payer: Self-pay | Admitting: Internal Medicine

## 2023-11-01 DIAGNOSIS — Z1231 Encounter for screening mammogram for malignant neoplasm of breast: Secondary | ICD-10-CM

## 2023-11-15 ENCOUNTER — Other Ambulatory Visit: Payer: Self-pay | Admitting: Internal Medicine

## 2023-11-20 ENCOUNTER — Ambulatory Visit
Admission: RE | Admit: 2023-11-20 | Discharge: 2023-11-20 | Disposition: A | Discharge status: Home / Self Care | Source: Ambulatory Visit | Attending: Internal Medicine | Admitting: Internal Medicine

## 2023-11-20 DIAGNOSIS — Z1231 Encounter for screening mammogram for malignant neoplasm of breast: Secondary | ICD-10-CM | POA: Diagnosis not present

## 2023-11-23 ENCOUNTER — Ambulatory Visit: Payer: Self-pay | Admitting: Internal Medicine

## 2023-12-01 ENCOUNTER — Other Ambulatory Visit: Payer: Self-pay | Admitting: Internal Medicine

## 2023-12-13 ENCOUNTER — Ambulatory Visit: Payer: Self-pay | Admitting: Internal Medicine

## 2023-12-13 ENCOUNTER — Ambulatory Visit: Payer: Medicare Other | Admitting: Internal Medicine

## 2023-12-13 ENCOUNTER — Encounter: Payer: Self-pay | Admitting: Internal Medicine

## 2023-12-13 VITALS — BP 130/80 | HR 80 | Temp 98.9°F | Ht 63.5 in | Wt 139.0 lb

## 2023-12-13 DIAGNOSIS — D473 Essential (hemorrhagic) thrombocythemia: Secondary | ICD-10-CM

## 2023-12-13 DIAGNOSIS — E1159 Type 2 diabetes mellitus with other circulatory complications: Secondary | ICD-10-CM

## 2023-12-13 DIAGNOSIS — Z7984 Long term (current) use of oral hypoglycemic drugs: Secondary | ICD-10-CM | POA: Diagnosis not present

## 2023-12-13 DIAGNOSIS — Z7985 Long-term (current) use of injectable non-insulin antidiabetic drugs: Secondary | ICD-10-CM | POA: Diagnosis not present

## 2023-12-13 DIAGNOSIS — I10 Essential (primary) hypertension: Secondary | ICD-10-CM | POA: Diagnosis not present

## 2023-12-13 LAB — POCT GLYCOSYLATED HEMOGLOBIN (HGB A1C): Hemoglobin A1C: 5.7 % — AB (ref 4.0–5.6)

## 2023-12-13 NOTE — Progress Notes (Signed)
 Subjective:    Patient ID: Savannah Evans, female    DOB: August 11, 1950, 73 y.o.   MRN: [id number]  HPI Here for follow up of diabetes and other chronic health conditions  Doing well Just [place] the beach with 18 family members  Checks sugars sporadically---under 120 generally  No foot numbness or burning  No chest pain or SOB [place]. Not much weights----discussed No dizziness or syncope No edema  Platelets have been okay Follows with Dr Lonn Hgb down to 10---limiting factor with hydroxyurea   Current Outpatient Medications on File Prior to Visit  Medication Sig Dispense Refill   Ascorbic Acid (VITAMIN C) 500 MG tablet Take 500 mg by mouth daily.       aspirin 81 MG tablet Take 81 mg by mouth daily.       calcium-vitamin D (OSCAL) 250-125 MG-UNIT per tablet Take 1 tablet by mouth daily.       cetirizine (ZYRTEC) 10 MG tablet Take 10 mg by mouth daily as needed for allergies.      ferrous sulfate 325 (65 FE) MG tablet Take 325 mg by mouth daily with breakfast.     fish oil-omega-3 fatty acids 1000 MG capsule Take 1 g by mouth daily.      glipiZIDE  (GLUCOTROL ) 5 MG tablet TAKE 1 TABLET (5 MG TOTAL) BY MOUTH TWICE A DAY BEFORE MEALS 180 tablet 3   hydroxyurea  (HYDREA ) 500 MG capsule She will take 1000 mg 4 times a week and 500 mg on Mondays, Wednesdays and Fridays 90 capsule 1   metFORMIN  (GLUCOPHAGE ) 1000 MG tablet TAKE 1 TABLET BY MOUTH TWICE A DAY W/ A MEAL 180 tablet 3   Multiple Vitamin (MULTIVITAMIN) tablet Take 1 tablet by mouth daily.       omeprazole (PRILOSEC OTC) 20 MG tablet Take 20 mg by mouth daily.      Semaglutide , 2 MG/DOSE, (OZEMPIC , 2 MG/DOSE,) 8 MG/3ML SOPN INJECT 2 MG AS DIRECTED ONCE A WEEK. 9 mL 3   simvastatin  (ZOCOR ) 10 MG tablet TAKE 1 TABLET BY MOUTH EVERY DAY 90 tablet 3   valsartan  (DIOVAN ) 80 MG tablet Take 1 tablet (80 mg total) by mouth daily. 90 tablet 3   No current facility-administered  medications on file prior to visit.    No Known Allergies  Past Medical History:  Diagnosis Date   Allergic rhinitis    Anemia, unspecified 03/03/2013   Diabetes mellitus type 2, noninsulin dependent (HCC)    Diaphragmatic hernia    Essential thrombocythemia (HCC) 10/21/2012   JAK2 neg; BCR-ABL neg; bone marrow biopsy on 09/29/12 showed normal cytogenetics.    GERD (gastroesophageal reflux disease)    History of palpitations    Hyperlipidemia    Iron  deficiency    Osteoarthritis    SVT (supraventricular tachycardia) (HCC)     Past Surgical History:  Procedure Laterality Date   COLONOSCOPY     neg age 31.  Next age 62.    svt ablation  2000   Dr. Fernande    Family History  Problem Relation Age of Onset   Macular degeneration Mother    Heart attack Father    Uterine cancer Sister    Diabetes Sister    Cancer Paternal Aunt        cancer?   Cancer Paternal Uncle        cancer?   Colon cancer Neg Hx    Breast cancer Neg Hx  Rectal cancer Neg Hx    Stomach cancer Neg Hx     Social History   Socioeconomic History   Marital status: Married    Spouse name: Not on file   Number of children: 3   Years of education: Not on file   Highest education level: Not on file  Occupational History   Occupation: Manages loan administration area for [employer]    Comment: Retired 2020  Tobacco Use   Smoking status: Never    Passive exposure: Past   Smokeless tobacco: Never  Substance and Sexual Activity   Alcohol use: Yes    Alcohol/week: 1.0 standard drink of alcohol    Types: 1 Glasses of wine per week    Comment: social   Drug use: No   Sexual activity: Not on file  Other Topics Concern   Not on file  Social History Narrative   Has living will   Requests husband as health care POA-- alternate is daughter Corean   Would accept resuscitation attempts but no prolonged ventilation   No tube feeds if cognitively unaware   Social Drivers of Research scientist (physical sciences) Strain: Not on file  Food Insecurity: Not on file  Transportation Needs: Not on file  Physical Activity: Not on file  Stress: Not on file  Social Connections: Not on file  Intimate Partner Violence: Not on file   Review of Systems Appetite is okay--down with the ozempic  though Bowels move fine Weight down slightly Sleeps well     Objective:   Physical Exam Constitutional:      Appearance: Normal appearance.  Cardiovascular:     Rate and Rhythm: Normal rate and regular rhythm.     Pulses: Normal pulses.     Heart sounds: No murmur heard.    No gallop.  Pulmonary:     Effort: Pulmonary effort is normal.     Breath sounds: Normal breath sounds. No wheezing or rales.  Musculoskeletal:     Cervical back: Neck supple.     Right lower leg: No edema.     Left lower leg: No edema.  Lymphadenopathy:     Cervical: No cervical adenopathy.  Neurological:     Mental Status: She is alert.            Assessment & Plan:

## 2023-12-13 NOTE — Addendum Note (Signed)
 Addended by: KALLIE CLOTILDA SQUIBB on: 12/13/2023 09:30 AM   Modules accepted: Orders

## 2023-12-13 NOTE — Assessment & Plan Note (Signed)
 Controlled with the hydroxyurea  per Dr Lonn

## 2023-12-13 NOTE — Assessment & Plan Note (Signed)
 BP Readings from Last 3 Encounters:  12/13/23 130/80  09/14/23 (!) 140/62  07/20/23 (!) 145/73   Controlled on the valsartan  80

## 2023-12-13 NOTE — Assessment & Plan Note (Signed)
 A1c down to 5.7% Continue ozempic  2mg  weekly, metformin  1000 bid and glipizide  5mg  bid Would cut the glipizide  if any sig hypoglycemia

## 2024-01-17 ENCOUNTER — Encounter: Payer: Self-pay | Admitting: Hematology and Oncology

## 2024-01-17 ENCOUNTER — Ambulatory Visit: Admitting: Hematology and Oncology

## 2024-01-17 ENCOUNTER — Inpatient Hospital Stay (HOSPITAL_BASED_OUTPATIENT_CLINIC_OR_DEPARTMENT_OTHER): Admitting: Hematology and Oncology

## 2024-01-17 ENCOUNTER — Inpatient Hospital Stay: Attending: Hematology and Oncology

## 2024-01-17 ENCOUNTER — Other Ambulatory Visit

## 2024-01-17 VITALS — BP 149/68 | HR 75 | Temp 97.7°F | Resp 18 | Ht 63.5 in | Wt 146.0 lb

## 2024-01-17 DIAGNOSIS — D630 Anemia in neoplastic disease: Secondary | ICD-10-CM | POA: Insufficient documentation

## 2024-01-17 DIAGNOSIS — E1159 Type 2 diabetes mellitus with other circulatory complications: Secondary | ICD-10-CM

## 2024-01-17 DIAGNOSIS — D473 Essential (hemorrhagic) thrombocythemia: Secondary | ICD-10-CM

## 2024-01-17 LAB — CBC WITH DIFFERENTIAL/PLATELET
Abs Immature Granulocytes: 0.01 K/uL (ref 0.00–0.07)
Basophils Absolute: 0 K/uL (ref 0.0–0.1)
Basophils Relative: 1 %
Eosinophils Absolute: 0.1 K/uL (ref 0.0–0.5)
Eosinophils Relative: 1 %
HCT: 29.8 % — ABNORMAL LOW (ref 36.0–46.0)
Hemoglobin: 10.6 g/dL — ABNORMAL LOW (ref 12.0–15.0)
Immature Granulocytes: 0 %
Lymphocytes Relative: 48 %
Lymphs Abs: 2 K/uL (ref 0.7–4.0)
MCH: 38 pg — ABNORMAL HIGH (ref 26.0–34.0)
MCHC: 35.6 g/dL (ref 30.0–36.0)
MCV: 106.8 fL — ABNORMAL HIGH (ref 80.0–100.0)
Monocytes Absolute: 0.3 K/uL (ref 0.1–1.0)
Monocytes Relative: 8 %
Neutro Abs: 1.7 K/uL (ref 1.7–7.7)
Neutrophils Relative %: 42 %
Platelets: 413 K/uL — ABNORMAL HIGH (ref 150–400)
RBC: 2.79 MIL/uL — ABNORMAL LOW (ref 3.87–5.11)
RDW: 11.6 % (ref 11.5–15.5)
WBC: 4.1 K/uL (ref 4.0–10.5)
nRBC: 0 % (ref 0.0–0.2)

## 2024-01-17 LAB — BASIC METABOLIC PANEL - CANCER CENTER ONLY
Anion gap: 5 (ref 5–15)
BUN: 10 mg/dL (ref 8–23)
CO2: 30 mmol/L (ref 22–32)
Calcium: 9.6 mg/dL (ref 8.9–10.3)
Chloride: 99 mmol/L (ref 98–111)
Creatinine: 0.66 mg/dL (ref 0.44–1.00)
GFR, Estimated: >60 mL/min (ref >60)
Glucose, Bld: 115 mg/dL — ABNORMAL HIGH (ref 70–99)
Potassium: 4.9 mmol/L (ref 3.5–5.1)
Sodium: 134 mmol/L — ABNORMAL LOW (ref 135–145)

## 2024-01-17 MED ORDER — HYDROXYUREA 500 MG PO CAPS: ORAL_CAPSULE | ORAL | 3 refills | Status: DC

## 2024-01-17 NOTE — Progress Notes (Signed)
 [facility] OFFICE PROGRESS NOTE  Patient Care Team: Savannah Charlie FERNS, MD as PCP - General Lonni LELON Hamilton, MD as Attending Physician (Obstetrics and Gynecology) Lonn Hicks, MD as Consulting Physician (Hematology and Oncology) Myra Rosaline FALCON, [facility] (Inactive) as Pharmacist (Pharmacist)  Assessment & Plan Essential thrombocythemia [facility]) Her CBC showed stable anemia Her platelet count is improved compared to her previous visit For now, I recommend she continues on the same doses of treatment She will take 1000 mg 4 times a week and 500 mg on Mondays, Wednesdays and Fridays I refilled her prescription today I will reassess in a few months  No orders of the defined types were placed in this encounter.    Hicks Lonn, MD  INTERVAL HISTORY: she returns for surveillance follow-up for myeloproliferative disorder/neoplasm Patient denies recent bleeding such as epistaxis, hematuria or hematochezia No recent infection We reviewed medication list and discussed medication changes We discussed test results and future plan of care as outlined above  PHYSICAL EXAMINATION: ECOG PERFORMANCE STATUS: 0 - Asymptomatic  Vitals:   01/17/24 0854  BP: (!) 149/68  Pulse: 75  Resp: 18  Temp: 97.7 F (36.5 C)  SpO2: 100%   Lab Results  Component Value Date   WBC 4.1 01/17/2024   HGB 10.6 (L) 01/17/2024   HCT 29.8 (L) 01/17/2024   MCV 106.8 (H) 01/17/2024   PLT 413 (H) 01/17/2024

## 2024-01-17 NOTE — Assessment & Plan Note (Addendum)
 Her CBC showed stable anemia Her platelet count is improved compared to her previous visit For now, I recommend she continues on the same doses of treatment She will take 1000 mg 4 times a week and 500 mg on Mondays, Wednesdays and Fridays I refilled her prescription today I will reassess in a few months

## 2024-01-27 ENCOUNTER — Other Ambulatory Visit: Payer: Self-pay | Admitting: Hematology and Oncology

## 2024-02-21 ENCOUNTER — Other Ambulatory Visit: Payer: Self-pay

## 2024-02-21 MED ORDER — GLIPIZIDE 5 MG PO TABS: ORAL_TABLET | ORAL | 1 refills | Status: AC

## 2024-03-27 LAB — OPHTHALMOLOGY REPORT-SCANNED

## 2024-03-31 ENCOUNTER — Ambulatory Visit: Payer: Self-pay

## 2024-03-31 DIAGNOSIS — Z0100 Encounter for examination of eyes and vision without abnormal findings: Secondary | ICD-10-CM | POA: Insufficient documentation

## 2024-04-04 ENCOUNTER — Other Ambulatory Visit: Payer: Self-pay

## 2024-04-04 MED ORDER — SIMVASTATIN 10 MG PO TABS: 10.0000 mg | ORAL_TABLET | Freq: Every day | ORAL | 0 refills | Status: AC

## 2024-05-22 ENCOUNTER — Encounter: Payer: Self-pay | Admitting: Hematology and Oncology

## 2024-05-22 ENCOUNTER — Inpatient Hospital Stay: Attending: Hematology and Oncology

## 2024-05-22 ENCOUNTER — Inpatient Hospital Stay (HOSPITAL_BASED_OUTPATIENT_CLINIC_OR_DEPARTMENT_OTHER): Admitting: Hematology and Oncology

## 2024-05-22 VITALS — BP 157/63 | HR 75 | Temp 97.9°F | Resp 18 | Ht 63.5 in | Wt 143.6 lb

## 2024-05-22 DIAGNOSIS — D630 Anemia in neoplastic disease: Secondary | ICD-10-CM | POA: Diagnosis not present

## 2024-05-22 DIAGNOSIS — D473 Essential (hemorrhagic) thrombocythemia: Secondary | ICD-10-CM | POA: Diagnosis not present

## 2024-05-22 LAB — CBC WITH DIFFERENTIAL/PLATELET
Abs Immature Granulocytes: 0.01 K/uL (ref 0.00–0.07)
Basophils Absolute: 0 K/uL (ref 0.0–0.1)
Basophils Relative: 1 %
Eosinophils Absolute: 0.1 K/uL (ref 0.0–0.5)
Eosinophils Relative: 3 %
HCT: 30.4 % — ABNORMAL LOW (ref 36.0–46.0)
Hemoglobin: 10.5 g/dL — ABNORMAL LOW (ref 12.0–15.0)
Immature Granulocytes: 0 %
Lymphocytes Relative: 36 %
Lymphs Abs: 1.7 K/uL (ref 0.7–4.0)
MCH: 37.1 pg — ABNORMAL HIGH (ref 26.0–34.0)
MCHC: 34.5 g/dL (ref 30.0–36.0)
MCV: 107.4 fL — ABNORMAL HIGH (ref 80.0–100.0)
Monocytes Absolute: 0.5 K/uL (ref 0.1–1.0)
Monocytes Relative: 10 %
Neutro Abs: 2.4 K/uL (ref 1.7–7.7)
Neutrophils Relative %: 50 %
Platelets: 428 K/uL — ABNORMAL HIGH (ref 150–400)
RBC: 2.83 MIL/uL — ABNORMAL LOW (ref 3.87–5.11)
RDW: 11.9 % (ref 11.5–15.5)
WBC: 4.7 K/uL (ref 4.0–10.5)
nRBC: 0 % (ref 0.0–0.2)

## 2024-05-22 NOTE — Assessment & Plan Note (Addendum)
 Her CBC showed stable anemia Her platelet count is slightly higher but overall stable For now, I recommend she continues on the same doses of treatment She will take 1000 mg 4 times a week and 500 mg on Mondays, Wednesdays and Fridays I will reassess in a few months

## 2024-05-22 NOTE — Progress Notes (Signed)
 [facility] OFFICE PROGRESS NOTE  Patient Care Team: Jimmy Charlie FERNS, MD as PCP - General Lonni LELON Hamilton, MD as Attending Physician (Obstetrics and Gynecology) Lonn Hicks, MD as Consulting Physician (Hematology and Oncology)  Assessment & Plan Essential thrombocythemia [facility]) Her CBC showed stable anemia Her platelet count is slightly higher but overall stable For now, I recommend she continues on the same doses of treatment She will take 1000 mg 4 times a week and 500 mg on Mondays, Wednesdays and Fridays I will reassess in a few months  No orders of the defined types were placed in this encounter.    Hicks Lonn, MD  INTERVAL HISTORY: she returns for surveillance follow-up for myeloproliferative disorder/neoplasm Patient denies recent bleeding such as epistaxis, hematuria or hematochezia No recent infection We reviewed medication list and discussed medication changes We discussed test results and future plan of care as outlined above  PHYSICAL EXAMINATION: ECOG PERFORMANCE STATUS: 0 - Asymptomatic  Vitals:   05/22/24 0845  BP: (!) 157/63  Pulse: 75  Resp: 18  Temp: 97.9 F (36.6 C)  SpO2: 100%   Lab Results  Component Value Date   WBC 4.7 05/22/2024   HGB 10.5 (L) 05/22/2024   HCT 30.4 (L) 05/22/2024   MCV 107.4 (H) 05/22/2024   PLT 428 (H) 05/22/2024

## 2024-06-03 ENCOUNTER — Telehealth: Payer: Self-pay

## 2024-06-03 NOTE — Telephone Encounter (Signed)
 Copied from CRM [id number]. Topic: General - Other >> Jun 03, 2024  2:45 PM Jasmin G wrote: Reason for CRM: Pt requested a call back at [phone] from Ms. Lindie as she states that she received a missed call from her.

## 2024-06-19 ENCOUNTER — Encounter

## 2024-07-24 ENCOUNTER — Ambulatory Visit

## 2024-09-18 ENCOUNTER — Inpatient Hospital Stay

## 2024-09-18 ENCOUNTER — Inpatient Hospital Stay: Admitting: Hematology and Oncology
